# Patient Record
Sex: Male | Born: 1945
Health system: Southern US, Community
[De-identification: ages and names within clinical notes are randomized; demographics above are authoritative.]

## PROBLEM LIST (undated history)

## (undated) DIAGNOSIS — I10 Essential (primary) hypertension: Secondary | ICD-10-CM

## (undated) DIAGNOSIS — E785 Hyperlipidemia, unspecified: Secondary | ICD-10-CM

## (undated) HISTORY — DX: Hyperlipidemia, unspecified: E78.5

## (undated) HISTORY — PX: APPENDECTOMY: SHX54

## (undated) HISTORY — DX: Essential (primary) hypertension: I10

---

## 2004-06-09 ENCOUNTER — Ambulatory Visit (HOSPITAL_COMMUNITY): Admission: RE | Admit: 2004-06-09 | Discharge: 2004-06-09 | Payer: Self-pay | Admitting: Gastroenterology

## 2006-08-21 ENCOUNTER — Emergency Department (HOSPITAL_COMMUNITY): Admission: EM | Admit: 2006-08-21 | Discharge: 2006-08-22 | Payer: Self-pay | Admitting: Emergency Medicine

## 2011-10-10 DIAGNOSIS — Z1212 Encounter for screening for malignant neoplasm of rectum: Secondary | ICD-10-CM | POA: Diagnosis not present

## 2011-10-10 DIAGNOSIS — E785 Hyperlipidemia, unspecified: Secondary | ICD-10-CM | POA: Diagnosis not present

## 2011-10-10 DIAGNOSIS — E119 Type 2 diabetes mellitus without complications: Secondary | ICD-10-CM | POA: Diagnosis not present

## 2011-10-10 DIAGNOSIS — Z125 Encounter for screening for malignant neoplasm of prostate: Secondary | ICD-10-CM | POA: Diagnosis not present

## 2011-10-10 DIAGNOSIS — E559 Vitamin D deficiency, unspecified: Secondary | ICD-10-CM | POA: Diagnosis not present

## 2011-10-10 DIAGNOSIS — Z0000 Encounter for general adult medical examination without abnormal findings: Secondary | ICD-10-CM | POA: Diagnosis not present

## 2011-10-10 DIAGNOSIS — I10 Essential (primary) hypertension: Secondary | ICD-10-CM | POA: Diagnosis not present

## 2012-03-09 DIAGNOSIS — B029 Zoster without complications: Secondary | ICD-10-CM | POA: Diagnosis not present

## 2012-03-09 DIAGNOSIS — Z79899 Other long term (current) drug therapy: Secondary | ICD-10-CM | POA: Diagnosis not present

## 2012-04-12 DIAGNOSIS — M25519 Pain in unspecified shoulder: Secondary | ICD-10-CM | POA: Diagnosis not present

## 2012-04-12 DIAGNOSIS — I10 Essential (primary) hypertension: Secondary | ICD-10-CM | POA: Diagnosis not present

## 2012-04-12 DIAGNOSIS — Z79899 Other long term (current) drug therapy: Secondary | ICD-10-CM | POA: Diagnosis not present

## 2012-11-15 DIAGNOSIS — Z79899 Other long term (current) drug therapy: Secondary | ICD-10-CM | POA: Diagnosis not present

## 2012-11-15 DIAGNOSIS — Z125 Encounter for screening for malignant neoplasm of prostate: Secondary | ICD-10-CM | POA: Diagnosis not present

## 2012-11-15 DIAGNOSIS — R7309 Other abnormal glucose: Secondary | ICD-10-CM | POA: Diagnosis not present

## 2012-11-15 DIAGNOSIS — I10 Essential (primary) hypertension: Secondary | ICD-10-CM | POA: Diagnosis not present

## 2012-11-15 DIAGNOSIS — J309 Allergic rhinitis, unspecified: Secondary | ICD-10-CM | POA: Diagnosis not present

## 2012-11-15 DIAGNOSIS — Z23 Encounter for immunization: Secondary | ICD-10-CM | POA: Diagnosis not present

## 2012-11-15 DIAGNOSIS — N400 Enlarged prostate without lower urinary tract symptoms: Secondary | ICD-10-CM | POA: Diagnosis not present

## 2012-11-15 DIAGNOSIS — Z0000 Encounter for general adult medical examination without abnormal findings: Secondary | ICD-10-CM | POA: Diagnosis not present

## 2012-11-15 DIAGNOSIS — F17200 Nicotine dependence, unspecified, uncomplicated: Secondary | ICD-10-CM | POA: Diagnosis not present

## 2012-11-15 DIAGNOSIS — E559 Vitamin D deficiency, unspecified: Secondary | ICD-10-CM | POA: Diagnosis not present

## 2012-11-15 DIAGNOSIS — Z1212 Encounter for screening for malignant neoplasm of rectum: Secondary | ICD-10-CM | POA: Diagnosis not present

## 2014-01-06 DIAGNOSIS — R634 Abnormal weight loss: Secondary | ICD-10-CM | POA: Diagnosis not present

## 2014-01-06 DIAGNOSIS — R109 Unspecified abdominal pain: Secondary | ICD-10-CM | POA: Diagnosis not present

## 2014-01-06 DIAGNOSIS — K219 Gastro-esophageal reflux disease without esophagitis: Secondary | ICD-10-CM | POA: Diagnosis not present

## 2014-01-06 DIAGNOSIS — R413 Other amnesia: Secondary | ICD-10-CM | POA: Diagnosis not present

## 2014-01-06 DIAGNOSIS — R221 Localized swelling, mass and lump, neck: Secondary | ICD-10-CM | POA: Diagnosis not present

## 2014-01-06 DIAGNOSIS — Z79899 Other long term (current) drug therapy: Secondary | ICD-10-CM | POA: Diagnosis not present

## 2014-01-06 DIAGNOSIS — R220 Localized swelling, mass and lump, head: Secondary | ICD-10-CM | POA: Diagnosis not present

## 2014-01-07 ENCOUNTER — Ambulatory Visit
Admission: RE | Admit: 2014-01-07 | Discharge: 2014-01-07 | Disposition: A | Discharge status: Home / Self Care | Payer: Medicare Other | Source: Ambulatory Visit | Attending: Family | Admitting: Family

## 2014-01-07 ENCOUNTER — Other Ambulatory Visit: Payer: Self-pay | Admitting: Family

## 2014-01-07 DIAGNOSIS — R634 Abnormal weight loss: Secondary | ICD-10-CM | POA: Diagnosis not present

## 2014-01-07 DIAGNOSIS — R059 Cough, unspecified: Secondary | ICD-10-CM | POA: Diagnosis not present

## 2014-01-07 DIAGNOSIS — R05 Cough: Secondary | ICD-10-CM | POA: Diagnosis not present

## 2014-01-07 DIAGNOSIS — F17200 Nicotine dependence, unspecified, uncomplicated: Secondary | ICD-10-CM | POA: Diagnosis not present

## 2014-01-07 DIAGNOSIS — Z720 Tobacco use: Secondary | ICD-10-CM

## 2014-01-10 DIAGNOSIS — R413 Other amnesia: Secondary | ICD-10-CM | POA: Diagnosis not present

## 2014-01-10 DIAGNOSIS — R0602 Shortness of breath: Secondary | ICD-10-CM | POA: Diagnosis not present

## 2014-01-10 DIAGNOSIS — R630 Anorexia: Secondary | ICD-10-CM | POA: Diagnosis not present

## 2014-01-10 DIAGNOSIS — Z125 Encounter for screening for malignant neoplasm of prostate: Secondary | ICD-10-CM | POA: Diagnosis not present

## 2014-01-10 DIAGNOSIS — R634 Abnormal weight loss: Secondary | ICD-10-CM | POA: Diagnosis not present

## 2014-01-14 DIAGNOSIS — R634 Abnormal weight loss: Secondary | ICD-10-CM | POA: Diagnosis not present

## 2014-01-14 DIAGNOSIS — Z1211 Encounter for screening for malignant neoplasm of colon: Secondary | ICD-10-CM | POA: Diagnosis not present

## 2014-01-14 DIAGNOSIS — Z86010 Personal history of colonic polyps: Secondary | ICD-10-CM | POA: Diagnosis not present

## 2014-01-14 DIAGNOSIS — R198 Other specified symptoms and signs involving the digestive system and abdomen: Secondary | ICD-10-CM | POA: Diagnosis not present

## 2014-01-24 DIAGNOSIS — E7800 Pure hypercholesterolemia, unspecified: Secondary | ICD-10-CM | POA: Diagnosis not present

## 2014-01-24 DIAGNOSIS — K7689 Other specified diseases of liver: Secondary | ICD-10-CM | POA: Diagnosis not present

## 2014-01-29 DIAGNOSIS — K7689 Other specified diseases of liver: Secondary | ICD-10-CM | POA: Diagnosis not present

## 2014-02-06 DIAGNOSIS — R413 Other amnesia: Secondary | ICD-10-CM | POA: Diagnosis not present

## 2014-02-06 DIAGNOSIS — G3184 Mild cognitive impairment, so stated: Secondary | ICD-10-CM | POA: Diagnosis not present

## 2014-03-10 DIAGNOSIS — G3184 Mild cognitive impairment, so stated: Secondary | ICD-10-CM | POA: Diagnosis not present

## 2014-04-04 DIAGNOSIS — E780 Pure hypercholesterolemia: Secondary | ICD-10-CM | POA: Diagnosis not present

## 2014-04-04 DIAGNOSIS — K7689 Other specified diseases of liver: Secondary | ICD-10-CM | POA: Diagnosis not present

## 2014-04-04 DIAGNOSIS — R634 Abnormal weight loss: Secondary | ICD-10-CM | POA: Diagnosis not present

## 2014-04-04 DIAGNOSIS — G3184 Mild cognitive impairment, so stated: Secondary | ICD-10-CM | POA: Diagnosis not present

## 2014-04-11 DIAGNOSIS — R412 Retrograde amnesia: Secondary | ICD-10-CM | POA: Diagnosis not present

## 2014-04-11 DIAGNOSIS — R413 Other amnesia: Secondary | ICD-10-CM | POA: Diagnosis not present

## 2014-04-11 DIAGNOSIS — G319 Degenerative disease of nervous system, unspecified: Secondary | ICD-10-CM | POA: Diagnosis not present

## 2014-04-11 DIAGNOSIS — G3184 Mild cognitive impairment, so stated: Secondary | ICD-10-CM | POA: Diagnosis not present

## 2014-06-10 DIAGNOSIS — G3184 Mild cognitive impairment, so stated: Secondary | ICD-10-CM | POA: Diagnosis not present

## 2014-06-10 DIAGNOSIS — E780 Pure hypercholesterolemia: Secondary | ICD-10-CM | POA: Diagnosis not present

## 2014-06-10 DIAGNOSIS — E559 Vitamin D deficiency, unspecified: Secondary | ICD-10-CM | POA: Diagnosis not present

## 2014-06-10 DIAGNOSIS — I10 Essential (primary) hypertension: Secondary | ICD-10-CM | POA: Diagnosis not present

## 2014-06-10 DIAGNOSIS — K7689 Other specified diseases of liver: Secondary | ICD-10-CM | POA: Diagnosis not present

## 2014-06-23 DIAGNOSIS — R3911 Hesitancy of micturition: Secondary | ICD-10-CM | POA: Diagnosis not present

## 2014-06-23 DIAGNOSIS — I10 Essential (primary) hypertension: Secondary | ICD-10-CM | POA: Diagnosis not present

## 2014-06-23 DIAGNOSIS — R7309 Other abnormal glucose: Secondary | ICD-10-CM | POA: Diagnosis not present

## 2014-06-23 DIAGNOSIS — R627 Adult failure to thrive: Secondary | ICD-10-CM | POA: Diagnosis not present

## 2014-06-23 DIAGNOSIS — R5383 Other fatigue: Secondary | ICD-10-CM | POA: Diagnosis not present

## 2014-06-23 DIAGNOSIS — E782 Mixed hyperlipidemia: Secondary | ICD-10-CM | POA: Diagnosis not present

## 2014-08-08 DIAGNOSIS — R0602 Shortness of breath: Secondary | ICD-10-CM | POA: Diagnosis not present

## 2014-08-08 DIAGNOSIS — E559 Vitamin D deficiency, unspecified: Secondary | ICD-10-CM | POA: Diagnosis not present

## 2014-08-08 DIAGNOSIS — E870 Hyperosmolality and hypernatremia: Secondary | ICD-10-CM | POA: Diagnosis not present

## 2014-08-08 DIAGNOSIS — I10 Essential (primary) hypertension: Secondary | ICD-10-CM | POA: Diagnosis not present

## 2014-08-08 DIAGNOSIS — F17200 Nicotine dependence, unspecified, uncomplicated: Secondary | ICD-10-CM | POA: Diagnosis not present

## 2014-08-08 DIAGNOSIS — E780 Pure hypercholesterolemia: Secondary | ICD-10-CM | POA: Diagnosis not present

## 2014-08-18 DIAGNOSIS — E780 Pure hypercholesterolemia: Secondary | ICD-10-CM | POA: Diagnosis not present

## 2014-08-18 DIAGNOSIS — Z1389 Encounter for screening for other disorder: Secondary | ICD-10-CM | POA: Diagnosis not present

## 2014-08-18 DIAGNOSIS — I10 Essential (primary) hypertension: Secondary | ICD-10-CM | POA: Diagnosis not present

## 2014-08-18 DIAGNOSIS — E559 Vitamin D deficiency, unspecified: Secondary | ICD-10-CM | POA: Diagnosis not present

## 2014-09-08 DIAGNOSIS — G3184 Mild cognitive impairment, so stated: Secondary | ICD-10-CM | POA: Diagnosis not present

## 2014-09-10 DIAGNOSIS — R3911 Hesitancy of micturition: Secondary | ICD-10-CM | POA: Diagnosis not present

## 2014-09-10 DIAGNOSIS — N182 Chronic kidney disease, stage 2 (mild): Secondary | ICD-10-CM | POA: Diagnosis not present

## 2014-09-10 DIAGNOSIS — R7309 Other abnormal glucose: Secondary | ICD-10-CM | POA: Diagnosis not present

## 2014-09-10 DIAGNOSIS — Z0000 Encounter for general adult medical examination without abnormal findings: Secondary | ICD-10-CM | POA: Diagnosis not present

## 2014-09-11 DIAGNOSIS — Z0000 Encounter for general adult medical examination without abnormal findings: Secondary | ICD-10-CM | POA: Diagnosis not present

## 2014-09-25 DIAGNOSIS — Z720 Tobacco use: Secondary | ICD-10-CM | POA: Diagnosis not present

## 2014-09-25 DIAGNOSIS — E782 Mixed hyperlipidemia: Secondary | ICD-10-CM | POA: Diagnosis not present

## 2014-09-25 DIAGNOSIS — I10 Essential (primary) hypertension: Secondary | ICD-10-CM | POA: Diagnosis not present

## 2014-09-25 DIAGNOSIS — R413 Other amnesia: Secondary | ICD-10-CM | POA: Diagnosis not present

## 2014-11-07 DIAGNOSIS — R634 Abnormal weight loss: Secondary | ICD-10-CM | POA: Diagnosis not present

## 2014-11-07 DIAGNOSIS — E782 Mixed hyperlipidemia: Secondary | ICD-10-CM | POA: Diagnosis not present

## 2014-11-07 DIAGNOSIS — E559 Vitamin D deficiency, unspecified: Secondary | ICD-10-CM | POA: Diagnosis not present

## 2014-11-07 DIAGNOSIS — Z79899 Other long term (current) drug therapy: Secondary | ICD-10-CM | POA: Diagnosis not present

## 2015-02-04 DIAGNOSIS — R634 Abnormal weight loss: Secondary | ICD-10-CM | POA: Diagnosis not present

## 2015-02-04 DIAGNOSIS — R413 Other amnesia: Secondary | ICD-10-CM | POA: Diagnosis not present

## 2015-02-04 DIAGNOSIS — Z23 Encounter for immunization: Secondary | ICD-10-CM | POA: Diagnosis not present

## 2015-02-04 DIAGNOSIS — I10 Essential (primary) hypertension: Secondary | ICD-10-CM | POA: Diagnosis not present

## 2015-03-09 DIAGNOSIS — G3184 Mild cognitive impairment, so stated: Secondary | ICD-10-CM | POA: Diagnosis not present

## 2015-03-20 DIAGNOSIS — R413 Other amnesia: Secondary | ICD-10-CM | POA: Diagnosis not present

## 2015-03-20 DIAGNOSIS — R627 Adult failure to thrive: Secondary | ICD-10-CM | POA: Diagnosis not present

## 2015-08-20 DIAGNOSIS — F0390 Unspecified dementia without behavioral disturbance: Secondary | ICD-10-CM | POA: Diagnosis not present

## 2015-08-20 DIAGNOSIS — E782 Mixed hyperlipidemia: Secondary | ICD-10-CM | POA: Diagnosis not present

## 2015-08-20 DIAGNOSIS — I10 Essential (primary) hypertension: Secondary | ICD-10-CM | POA: Diagnosis not present

## 2015-09-25 DIAGNOSIS — R413 Other amnesia: Secondary | ICD-10-CM | POA: Diagnosis not present

## 2016-02-23 DIAGNOSIS — F0280 Dementia in other diseases classified elsewhere without behavioral disturbance: Secondary | ICD-10-CM | POA: Diagnosis not present

## 2016-02-23 DIAGNOSIS — I10 Essential (primary) hypertension: Secondary | ICD-10-CM | POA: Diagnosis not present

## 2016-02-23 DIAGNOSIS — E119 Type 2 diabetes mellitus without complications: Secondary | ICD-10-CM | POA: Diagnosis not present

## 2016-02-23 DIAGNOSIS — E785 Hyperlipidemia, unspecified: Secondary | ICD-10-CM | POA: Diagnosis not present

## 2016-02-23 DIAGNOSIS — G300 Alzheimer's disease with early onset: Secondary | ICD-10-CM | POA: Diagnosis not present

## 2016-02-23 DIAGNOSIS — E559 Vitamin D deficiency, unspecified: Secondary | ICD-10-CM | POA: Diagnosis not present

## 2016-02-24 DIAGNOSIS — E119 Type 2 diabetes mellitus without complications: Secondary | ICD-10-CM | POA: Diagnosis not present

## 2016-02-24 DIAGNOSIS — E785 Hyperlipidemia, unspecified: Secondary | ICD-10-CM | POA: Diagnosis not present

## 2016-02-24 DIAGNOSIS — F0280 Dementia in other diseases classified elsewhere without behavioral disturbance: Secondary | ICD-10-CM | POA: Diagnosis not present

## 2016-02-24 DIAGNOSIS — G300 Alzheimer's disease with early onset: Secondary | ICD-10-CM | POA: Diagnosis not present

## 2016-02-24 DIAGNOSIS — I10 Essential (primary) hypertension: Secondary | ICD-10-CM | POA: Diagnosis not present

## 2016-02-24 DIAGNOSIS — E559 Vitamin D deficiency, unspecified: Secondary | ICD-10-CM | POA: Diagnosis not present

## 2016-02-25 DIAGNOSIS — E559 Vitamin D deficiency, unspecified: Secondary | ICD-10-CM | POA: Diagnosis not present

## 2016-02-25 DIAGNOSIS — G300 Alzheimer's disease with early onset: Secondary | ICD-10-CM | POA: Diagnosis not present

## 2016-02-25 DIAGNOSIS — F0280 Dementia in other diseases classified elsewhere without behavioral disturbance: Secondary | ICD-10-CM | POA: Diagnosis not present

## 2016-02-25 DIAGNOSIS — E119 Type 2 diabetes mellitus without complications: Secondary | ICD-10-CM | POA: Diagnosis not present

## 2016-02-25 DIAGNOSIS — I10 Essential (primary) hypertension: Secondary | ICD-10-CM | POA: Diagnosis not present

## 2016-02-25 DIAGNOSIS — E785 Hyperlipidemia, unspecified: Secondary | ICD-10-CM | POA: Diagnosis not present

## 2016-03-03 DIAGNOSIS — R413 Other amnesia: Secondary | ICD-10-CM | POA: Diagnosis not present

## 2016-03-03 DIAGNOSIS — I10 Essential (primary) hypertension: Secondary | ICD-10-CM | POA: Diagnosis not present

## 2016-03-03 DIAGNOSIS — R7309 Other abnormal glucose: Secondary | ICD-10-CM | POA: Diagnosis not present

## 2016-03-03 DIAGNOSIS — Z0000 Encounter for general adult medical examination without abnormal findings: Secondary | ICD-10-CM | POA: Diagnosis not present

## 2016-03-03 DIAGNOSIS — Z23 Encounter for immunization: Secondary | ICD-10-CM | POA: Diagnosis not present

## 2016-03-03 DIAGNOSIS — E782 Mixed hyperlipidemia: Secondary | ICD-10-CM | POA: Diagnosis not present

## 2016-03-09 DIAGNOSIS — I10 Essential (primary) hypertension: Secondary | ICD-10-CM | POA: Diagnosis not present

## 2016-03-09 DIAGNOSIS — F0280 Dementia in other diseases classified elsewhere without behavioral disturbance: Secondary | ICD-10-CM | POA: Diagnosis not present

## 2016-03-09 DIAGNOSIS — E559 Vitamin D deficiency, unspecified: Secondary | ICD-10-CM | POA: Diagnosis not present

## 2016-03-09 DIAGNOSIS — G300 Alzheimer's disease with early onset: Secondary | ICD-10-CM | POA: Diagnosis not present

## 2016-03-09 DIAGNOSIS — E785 Hyperlipidemia, unspecified: Secondary | ICD-10-CM | POA: Diagnosis not present

## 2016-03-09 DIAGNOSIS — E119 Type 2 diabetes mellitus without complications: Secondary | ICD-10-CM | POA: Diagnosis not present

## 2016-03-11 DIAGNOSIS — E559 Vitamin D deficiency, unspecified: Secondary | ICD-10-CM | POA: Diagnosis not present

## 2016-03-11 DIAGNOSIS — F0280 Dementia in other diseases classified elsewhere without behavioral disturbance: Secondary | ICD-10-CM | POA: Diagnosis not present

## 2016-03-11 DIAGNOSIS — I10 Essential (primary) hypertension: Secondary | ICD-10-CM | POA: Diagnosis not present

## 2016-03-11 DIAGNOSIS — E119 Type 2 diabetes mellitus without complications: Secondary | ICD-10-CM | POA: Diagnosis not present

## 2016-03-11 DIAGNOSIS — E785 Hyperlipidemia, unspecified: Secondary | ICD-10-CM | POA: Diagnosis not present

## 2016-03-11 DIAGNOSIS — G300 Alzheimer's disease with early onset: Secondary | ICD-10-CM | POA: Diagnosis not present

## 2016-04-22 DIAGNOSIS — E559 Vitamin D deficiency, unspecified: Secondary | ICD-10-CM | POA: Diagnosis not present

## 2016-04-22 DIAGNOSIS — F0280 Dementia in other diseases classified elsewhere without behavioral disturbance: Secondary | ICD-10-CM | POA: Diagnosis not present

## 2016-04-22 DIAGNOSIS — G300 Alzheimer's disease with early onset: Secondary | ICD-10-CM | POA: Diagnosis not present

## 2016-04-22 DIAGNOSIS — E119 Type 2 diabetes mellitus without complications: Secondary | ICD-10-CM | POA: Diagnosis not present

## 2016-04-22 DIAGNOSIS — I10 Essential (primary) hypertension: Secondary | ICD-10-CM | POA: Diagnosis not present

## 2016-04-22 DIAGNOSIS — E785 Hyperlipidemia, unspecified: Secondary | ICD-10-CM | POA: Diagnosis not present

## 2016-04-23 DIAGNOSIS — E559 Vitamin D deficiency, unspecified: Secondary | ICD-10-CM | POA: Diagnosis not present

## 2016-04-23 DIAGNOSIS — E119 Type 2 diabetes mellitus without complications: Secondary | ICD-10-CM | POA: Diagnosis not present

## 2016-04-23 DIAGNOSIS — F0280 Dementia in other diseases classified elsewhere without behavioral disturbance: Secondary | ICD-10-CM | POA: Diagnosis not present

## 2016-04-23 DIAGNOSIS — Z7982 Long term (current) use of aspirin: Secondary | ICD-10-CM | POA: Diagnosis not present

## 2016-04-23 DIAGNOSIS — E782 Mixed hyperlipidemia: Secondary | ICD-10-CM | POA: Diagnosis not present

## 2016-04-23 DIAGNOSIS — G300 Alzheimer's disease with early onset: Secondary | ICD-10-CM | POA: Diagnosis not present

## 2016-04-23 DIAGNOSIS — I10 Essential (primary) hypertension: Secondary | ICD-10-CM | POA: Diagnosis not present

## 2016-05-05 DIAGNOSIS — E119 Type 2 diabetes mellitus without complications: Secondary | ICD-10-CM | POA: Diagnosis not present

## 2016-05-05 DIAGNOSIS — G300 Alzheimer's disease with early onset: Secondary | ICD-10-CM | POA: Diagnosis not present

## 2016-05-05 DIAGNOSIS — E782 Mixed hyperlipidemia: Secondary | ICD-10-CM | POA: Diagnosis not present

## 2016-05-05 DIAGNOSIS — F0280 Dementia in other diseases classified elsewhere without behavioral disturbance: Secondary | ICD-10-CM | POA: Diagnosis not present

## 2016-05-05 DIAGNOSIS — E559 Vitamin D deficiency, unspecified: Secondary | ICD-10-CM | POA: Diagnosis not present

## 2016-05-05 DIAGNOSIS — I10 Essential (primary) hypertension: Secondary | ICD-10-CM | POA: Diagnosis not present

## 2016-05-10 DIAGNOSIS — E119 Type 2 diabetes mellitus without complications: Secondary | ICD-10-CM | POA: Diagnosis not present

## 2016-05-10 DIAGNOSIS — E559 Vitamin D deficiency, unspecified: Secondary | ICD-10-CM | POA: Diagnosis not present

## 2016-05-10 DIAGNOSIS — G300 Alzheimer's disease with early onset: Secondary | ICD-10-CM | POA: Diagnosis not present

## 2016-05-10 DIAGNOSIS — E782 Mixed hyperlipidemia: Secondary | ICD-10-CM | POA: Diagnosis not present

## 2016-05-10 DIAGNOSIS — I10 Essential (primary) hypertension: Secondary | ICD-10-CM | POA: Diagnosis not present

## 2016-05-10 DIAGNOSIS — F0280 Dementia in other diseases classified elsewhere without behavioral disturbance: Secondary | ICD-10-CM | POA: Diagnosis not present

## 2016-05-13 DIAGNOSIS — E119 Type 2 diabetes mellitus without complications: Secondary | ICD-10-CM | POA: Diagnosis not present

## 2016-05-13 DIAGNOSIS — E559 Vitamin D deficiency, unspecified: Secondary | ICD-10-CM | POA: Diagnosis not present

## 2016-05-13 DIAGNOSIS — I10 Essential (primary) hypertension: Secondary | ICD-10-CM | POA: Diagnosis not present

## 2016-05-13 DIAGNOSIS — F0280 Dementia in other diseases classified elsewhere without behavioral disturbance: Secondary | ICD-10-CM | POA: Diagnosis not present

## 2016-05-13 DIAGNOSIS — E782 Mixed hyperlipidemia: Secondary | ICD-10-CM | POA: Diagnosis not present

## 2016-05-13 DIAGNOSIS — G300 Alzheimer's disease with early onset: Secondary | ICD-10-CM | POA: Diagnosis not present

## 2016-05-25 DIAGNOSIS — G300 Alzheimer's disease with early onset: Secondary | ICD-10-CM | POA: Diagnosis not present

## 2016-05-25 DIAGNOSIS — E559 Vitamin D deficiency, unspecified: Secondary | ICD-10-CM | POA: Diagnosis not present

## 2016-05-25 DIAGNOSIS — I10 Essential (primary) hypertension: Secondary | ICD-10-CM | POA: Diagnosis not present

## 2016-05-25 DIAGNOSIS — E119 Type 2 diabetes mellitus without complications: Secondary | ICD-10-CM | POA: Diagnosis not present

## 2016-05-25 DIAGNOSIS — F0280 Dementia in other diseases classified elsewhere without behavioral disturbance: Secondary | ICD-10-CM | POA: Diagnosis not present

## 2016-05-25 DIAGNOSIS — E782 Mixed hyperlipidemia: Secondary | ICD-10-CM | POA: Diagnosis not present

## 2016-06-09 DIAGNOSIS — E782 Mixed hyperlipidemia: Secondary | ICD-10-CM | POA: Diagnosis not present

## 2016-06-09 DIAGNOSIS — G300 Alzheimer's disease with early onset: Secondary | ICD-10-CM | POA: Diagnosis not present

## 2016-06-09 DIAGNOSIS — E559 Vitamin D deficiency, unspecified: Secondary | ICD-10-CM | POA: Diagnosis not present

## 2016-06-09 DIAGNOSIS — E119 Type 2 diabetes mellitus without complications: Secondary | ICD-10-CM | POA: Diagnosis not present

## 2016-06-09 DIAGNOSIS — I10 Essential (primary) hypertension: Secondary | ICD-10-CM | POA: Diagnosis not present

## 2016-06-09 DIAGNOSIS — F0280 Dementia in other diseases classified elsewhere without behavioral disturbance: Secondary | ICD-10-CM | POA: Diagnosis not present

## 2016-06-14 DIAGNOSIS — E559 Vitamin D deficiency, unspecified: Secondary | ICD-10-CM | POA: Diagnosis not present

## 2016-06-14 DIAGNOSIS — I10 Essential (primary) hypertension: Secondary | ICD-10-CM | POA: Diagnosis not present

## 2016-06-14 DIAGNOSIS — E782 Mixed hyperlipidemia: Secondary | ICD-10-CM | POA: Diagnosis not present

## 2016-06-14 DIAGNOSIS — E119 Type 2 diabetes mellitus without complications: Secondary | ICD-10-CM | POA: Diagnosis not present

## 2016-06-14 DIAGNOSIS — G300 Alzheimer's disease with early onset: Secondary | ICD-10-CM | POA: Diagnosis not present

## 2016-06-14 DIAGNOSIS — F0280 Dementia in other diseases classified elsewhere without behavioral disturbance: Secondary | ICD-10-CM | POA: Diagnosis not present

## 2016-06-20 DIAGNOSIS — G300 Alzheimer's disease with early onset: Secondary | ICD-10-CM | POA: Diagnosis not present

## 2016-06-20 DIAGNOSIS — F0280 Dementia in other diseases classified elsewhere without behavioral disturbance: Secondary | ICD-10-CM | POA: Diagnosis not present

## 2016-06-20 DIAGNOSIS — E559 Vitamin D deficiency, unspecified: Secondary | ICD-10-CM | POA: Diagnosis not present

## 2016-06-20 DIAGNOSIS — I10 Essential (primary) hypertension: Secondary | ICD-10-CM | POA: Diagnosis not present

## 2016-06-20 DIAGNOSIS — E119 Type 2 diabetes mellitus without complications: Secondary | ICD-10-CM | POA: Diagnosis not present

## 2016-06-20 DIAGNOSIS — E782 Mixed hyperlipidemia: Secondary | ICD-10-CM | POA: Diagnosis not present

## 2016-06-22 DIAGNOSIS — F0280 Dementia in other diseases classified elsewhere without behavioral disturbance: Secondary | ICD-10-CM | POA: Diagnosis not present

## 2016-06-22 DIAGNOSIS — E782 Mixed hyperlipidemia: Secondary | ICD-10-CM | POA: Diagnosis not present

## 2016-06-22 DIAGNOSIS — E559 Vitamin D deficiency, unspecified: Secondary | ICD-10-CM | POA: Diagnosis not present

## 2016-06-22 DIAGNOSIS — Z7982 Long term (current) use of aspirin: Secondary | ICD-10-CM | POA: Diagnosis not present

## 2016-06-22 DIAGNOSIS — E119 Type 2 diabetes mellitus without complications: Secondary | ICD-10-CM | POA: Diagnosis not present

## 2016-06-22 DIAGNOSIS — I10 Essential (primary) hypertension: Secondary | ICD-10-CM | POA: Diagnosis not present

## 2016-06-22 DIAGNOSIS — G300 Alzheimer's disease with early onset: Secondary | ICD-10-CM | POA: Diagnosis not present

## 2016-07-07 DIAGNOSIS — E119 Type 2 diabetes mellitus without complications: Secondary | ICD-10-CM | POA: Diagnosis not present

## 2016-07-07 DIAGNOSIS — E559 Vitamin D deficiency, unspecified: Secondary | ICD-10-CM | POA: Diagnosis not present

## 2016-07-07 DIAGNOSIS — E782 Mixed hyperlipidemia: Secondary | ICD-10-CM | POA: Diagnosis not present

## 2016-07-07 DIAGNOSIS — G300 Alzheimer's disease with early onset: Secondary | ICD-10-CM | POA: Diagnosis not present

## 2016-07-07 DIAGNOSIS — F0280 Dementia in other diseases classified elsewhere without behavioral disturbance: Secondary | ICD-10-CM | POA: Diagnosis not present

## 2016-07-07 DIAGNOSIS — I10 Essential (primary) hypertension: Secondary | ICD-10-CM | POA: Diagnosis not present

## 2016-07-15 DIAGNOSIS — E782 Mixed hyperlipidemia: Secondary | ICD-10-CM | POA: Diagnosis not present

## 2016-07-15 DIAGNOSIS — I10 Essential (primary) hypertension: Secondary | ICD-10-CM | POA: Diagnosis not present

## 2016-07-15 DIAGNOSIS — F0280 Dementia in other diseases classified elsewhere without behavioral disturbance: Secondary | ICD-10-CM | POA: Diagnosis not present

## 2016-07-15 DIAGNOSIS — G300 Alzheimer's disease with early onset: Secondary | ICD-10-CM | POA: Diagnosis not present

## 2016-07-15 DIAGNOSIS — E559 Vitamin D deficiency, unspecified: Secondary | ICD-10-CM | POA: Diagnosis not present

## 2016-07-15 DIAGNOSIS — E119 Type 2 diabetes mellitus without complications: Secondary | ICD-10-CM | POA: Diagnosis not present

## 2016-07-18 DIAGNOSIS — E559 Vitamin D deficiency, unspecified: Secondary | ICD-10-CM | POA: Diagnosis not present

## 2016-07-18 DIAGNOSIS — I10 Essential (primary) hypertension: Secondary | ICD-10-CM | POA: Diagnosis not present

## 2016-07-18 DIAGNOSIS — G300 Alzheimer's disease with early onset: Secondary | ICD-10-CM | POA: Diagnosis not present

## 2016-07-18 DIAGNOSIS — F0280 Dementia in other diseases classified elsewhere without behavioral disturbance: Secondary | ICD-10-CM | POA: Diagnosis not present

## 2016-07-18 DIAGNOSIS — E119 Type 2 diabetes mellitus without complications: Secondary | ICD-10-CM | POA: Diagnosis not present

## 2016-07-18 DIAGNOSIS — E782 Mixed hyperlipidemia: Secondary | ICD-10-CM | POA: Diagnosis not present

## 2016-07-28 DIAGNOSIS — G300 Alzheimer's disease with early onset: Secondary | ICD-10-CM | POA: Diagnosis not present

## 2016-07-28 DIAGNOSIS — F0280 Dementia in other diseases classified elsewhere without behavioral disturbance: Secondary | ICD-10-CM | POA: Diagnosis not present

## 2016-07-28 DIAGNOSIS — I10 Essential (primary) hypertension: Secondary | ICD-10-CM | POA: Diagnosis not present

## 2016-07-28 DIAGNOSIS — E782 Mixed hyperlipidemia: Secondary | ICD-10-CM | POA: Diagnosis not present

## 2016-07-28 DIAGNOSIS — E559 Vitamin D deficiency, unspecified: Secondary | ICD-10-CM | POA: Diagnosis not present

## 2016-07-28 DIAGNOSIS — E119 Type 2 diabetes mellitus without complications: Secondary | ICD-10-CM | POA: Diagnosis not present

## 2016-08-11 DIAGNOSIS — E782 Mixed hyperlipidemia: Secondary | ICD-10-CM | POA: Diagnosis not present

## 2016-08-11 DIAGNOSIS — E119 Type 2 diabetes mellitus without complications: Secondary | ICD-10-CM | POA: Diagnosis not present

## 2016-08-11 DIAGNOSIS — I10 Essential (primary) hypertension: Secondary | ICD-10-CM | POA: Diagnosis not present

## 2016-08-11 DIAGNOSIS — G300 Alzheimer's disease with early onset: Secondary | ICD-10-CM | POA: Diagnosis not present

## 2016-08-11 DIAGNOSIS — E559 Vitamin D deficiency, unspecified: Secondary | ICD-10-CM | POA: Diagnosis not present

## 2016-08-11 DIAGNOSIS — F0280 Dementia in other diseases classified elsewhere without behavioral disturbance: Secondary | ICD-10-CM | POA: Diagnosis not present

## 2017-01-11 DIAGNOSIS — Z9114 Patient's other noncompliance with medication regimen: Secondary | ICD-10-CM | POA: Diagnosis not present

## 2017-01-11 DIAGNOSIS — I10 Essential (primary) hypertension: Secondary | ICD-10-CM | POA: Diagnosis not present

## 2017-01-11 DIAGNOSIS — F0390 Unspecified dementia without behavioral disturbance: Secondary | ICD-10-CM | POA: Diagnosis not present

## 2017-01-11 DIAGNOSIS — E782 Mixed hyperlipidemia: Secondary | ICD-10-CM | POA: Diagnosis not present

## 2017-05-05 DIAGNOSIS — Z720 Tobacco use: Secondary | ICD-10-CM | POA: Diagnosis not present

## 2017-05-05 DIAGNOSIS — I10 Essential (primary) hypertension: Secondary | ICD-10-CM | POA: Diagnosis not present

## 2017-09-04 DIAGNOSIS — I10 Essential (primary) hypertension: Secondary | ICD-10-CM | POA: Diagnosis not present

## 2017-09-04 DIAGNOSIS — R7309 Other abnormal glucose: Secondary | ICD-10-CM | POA: Diagnosis not present

## 2017-09-04 DIAGNOSIS — R413 Other amnesia: Secondary | ICD-10-CM | POA: Diagnosis not present

## 2017-09-04 DIAGNOSIS — Z86010 Personal history of colonic polyps: Secondary | ICD-10-CM | POA: Diagnosis not present

## 2017-10-10 DIAGNOSIS — Z86010 Personal history of colonic polyps: Secondary | ICD-10-CM | POA: Diagnosis not present

## 2017-10-10 DIAGNOSIS — K5730 Diverticulosis of large intestine without perforation or abscess without bleeding: Secondary | ICD-10-CM | POA: Diagnosis not present

## 2017-10-10 DIAGNOSIS — Z1211 Encounter for screening for malignant neoplasm of colon: Secondary | ICD-10-CM | POA: Diagnosis not present

## 2018-02-14 DIAGNOSIS — Z86010 Personal history of colonic polyps: Secondary | ICD-10-CM | POA: Diagnosis not present

## 2018-02-14 DIAGNOSIS — D124 Benign neoplasm of descending colon: Secondary | ICD-10-CM | POA: Diagnosis not present

## 2018-02-14 DIAGNOSIS — Z1211 Encounter for screening for malignant neoplasm of colon: Secondary | ICD-10-CM | POA: Diagnosis not present

## 2018-02-14 DIAGNOSIS — K635 Polyp of colon: Secondary | ICD-10-CM | POA: Diagnosis not present

## 2018-02-14 DIAGNOSIS — D122 Benign neoplasm of ascending colon: Secondary | ICD-10-CM | POA: Diagnosis not present

## 2018-04-13 ENCOUNTER — Other Ambulatory Visit: Payer: Self-pay

## 2018-05-10 ENCOUNTER — Ambulatory Visit: Payer: Self-pay

## 2018-05-11 ENCOUNTER — Ambulatory Visit: Payer: Self-pay

## 2018-06-07 ENCOUNTER — Ambulatory Visit: Payer: Self-pay | Admitting: Nurse Practitioner

## 2018-06-07 ENCOUNTER — Ambulatory Visit: Payer: Self-pay

## 2018-06-11 ENCOUNTER — Telehealth: Payer: Self-pay | Admitting: Nurse Practitioner

## 2018-06-11 NOTE — Telephone Encounter (Signed)
I called both the home and mobile numbers to reschedule AWV/OV that was missed last week 06/07/2018.  There was no answer and no option to leave a message on either number. VDM (DD)

## 2018-06-20 ENCOUNTER — Telehealth: Payer: Self-pay

## 2018-06-20 NOTE — Telephone Encounter (Signed)
Called to schedule Medicare Annual Wellness Visit with the Nurse Health Advisor at [facility]. No Answer at Home number or Mobile number.  No answer machine on both phones; couldn't leave a message.  If patient returns call, please note: their last AWV was on 05/05/2017, please schedule AWV with NHA any date AFTER 05/06/2018, with [facility]  Thank you! For any questions please contact: Janace Hoard at [phone] or Skype [email or web address]

## 2018-06-26 NOTE — Telephone Encounter (Signed)
Called to schedule Medicare Annual Wellness Visit with the Nurse Health Advisor. At home phone number, message states call can not be completed as dialed,  At mobile phone number, it just rings with no message.   If patient returns call, please note: their last AWV was on 05/05/17, please schedule AWV with NHA any date AFTER 05/05/2018.  Need Office Visit also with provider at [facility].  Thank you! For any questions please contact: Janace Hoard at [phone] or Skype [email or web address]

## 2018-06-27 NOTE — Telephone Encounter (Signed)
Called to schedule Medicare Annual Wellness Visit with the Nurse Health Advisor. No answer at home and mobile numbers.    If patient returns call, please note: their last AWV was on 05/05/17, please schedule AWV with NHA any date AFTER 05/05/2018.  Thank you! For any questions please contact: Janace Hoard at [phone] or Skype [email or web address]

## 2018-06-28 ENCOUNTER — Other Ambulatory Visit: Payer: Self-pay

## 2018-06-28 MED ORDER — ESCITALOPRAM OXALATE 5 MG PO TABS: 5.0000 mg | ORAL_TABLET | Freq: Every day | ORAL | 0 refills | Status: DC

## 2018-07-04 NOTE — Telephone Encounter (Signed)
Spoke with patient and scheduled his Medicare Annual Wellness Visit (AWV-s), for 07/18/2018 at 2:00 PM.  Janace Hoard, Care Guide.

## 2018-07-09 ENCOUNTER — Ambulatory Visit (INDEPENDENT_AMBULATORY_CARE_PROVIDER_SITE_OTHER): Payer: Medicare Other | Admitting: Nurse Practitioner

## 2018-07-09 DIAGNOSIS — E559 Vitamin D deficiency, unspecified: Secondary | ICD-10-CM

## 2018-07-09 DIAGNOSIS — E782 Mixed hyperlipidemia: Secondary | ICD-10-CM

## 2018-07-09 DIAGNOSIS — R7303 Prediabetes: Secondary | ICD-10-CM

## 2018-07-09 DIAGNOSIS — I10 Essential (primary) hypertension: Secondary | ICD-10-CM

## 2018-07-09 DIAGNOSIS — F0391 Unspecified dementia with behavioral disturbance: Secondary | ICD-10-CM | POA: Diagnosis not present

## 2018-07-09 MED ORDER — AMLODIPINE BESYLATE 5 MG PO TABS: 5.0000 mg | ORAL_TABLET | Freq: Every day | ORAL | 1 refills | Status: DC

## 2018-07-09 NOTE — Progress Notes (Signed)
Subjective:     Patient ID: [name] , male    DOB: 09-Mar-1946 , 73 y.o.   MRN: [id number]   Chief Complaint  Patient presents with  . Hypertension    HPI  Memory loss - she feels his memory is worse at times.  He is not wanting to do anything and will sit in the room.  At times will not speak to her for 5 days.    Hypertension  This is a chronic problem. The current episode started more than 1 year ago. The problem has been gradually worsening since onset. The problem is uncontrolled. Pertinent negatives include no anxiety, chest pain, headaches or palpitations. There are no associated agents to hypertension. Risk factors for coronary artery disease include sedentary lifestyle. Past treatments include calcium channel blockers. There are no compliance problems.  There is no history of angina. There is no history of chronic renal disease.     No past medical history on file.   No family history on file.   Current Outpatient Medications:  .  amLODipine (NORVASC) 5 MG tablet, Take 5 mg by mouth daily. Patient currently not taking it due to not having anymore, Disp: , Rfl:  .  escitalopram (LEXAPRO) 5 MG tablet, Take 1 tablet (5 mg total) by mouth daily., Disp: 30 tablet, Rfl: 0 .  Memantine HCl-Donepezil HCl (NAMZARIC) 28-10 MG CP24, Take 1 capsule by mouth daily., Disp: , Rfl:  .  pravastatin (PRAVACHOL) 20 MG tablet, Take 20 mg by mouth daily., Disp: , Rfl:    No Known Allergies   Review of Systems  Constitutional: Negative.  Negative for fatigue.  HENT: Negative for sore throat.   Eyes: Negative for photophobia.  Respiratory: Negative.   Cardiovascular: Negative.  Negative for chest pain, palpitations and leg swelling.  Endocrine: Negative for polydipsia, polyphagia and polyuria.  Musculoskeletal: Negative.   Skin: Negative.   Neurological: Negative for dizziness and headaches.      There were no vitals filed for this visit. There is no height or weight on file to  calculate BMI.   Objective:  Physical Exam Vitals signs reviewed.  Constitutional:      Appearance: Normal appearance.  Cardiovascular:     Rate and Rhythm: Normal rate and regular rhythm.  Skin:    General: Skin is warm and dry.  Neurological:     Mental Status: He is alert.         Assessment And Plan:     1. Essential hypertension . B/P is controlled.  . CMP ordered to check renal function.  . The importance of regular exercise and dietary modification was stressed to the patient.  - amLODipine (NORVASC) 5 MG tablet; Take 1 tablet (5 mg total) by mouth daily. Patient currently not taking it due to not having anymore  Dispense: 90 tablet; Refill: 1 - BMP8+eGFR  2. Vitamin D deficiency  Will check vitamin D level and supplement as needed.     Also encouraged to spend 15 minutes in the sun daily.  - Vitamin D 1,25 Dihydroxy  3. Mixed hyperlipidemia  Chronic, controlled  Continue with current medications - Lipid panel  4. Prediabetes  Chronic, fair control  No current medications  Encouraged to limit intake of sugary foods and drinks  Encouraged to increase physical activity to 150 minutes per week - Hemoglobin A1c  5. Dementia with behavioral disturbance, unspecified dementia type ([place])  Chronic, continue current medications  His wife reports she is having  a challenging time with him taking his medications and unfortunately she is sick herself.   I am referring him to CCM for dementia assistance and the possibility of placement especially if his wife ends up in the hospital - Referral to [place]     [name], FNP

## 2018-07-12 ENCOUNTER — Ambulatory Visit: Payer: Self-pay

## 2018-07-12 DIAGNOSIS — I10 Essential (primary) hypertension: Secondary | ICD-10-CM

## 2018-07-12 DIAGNOSIS — R7303 Prediabetes: Secondary | ICD-10-CM

## 2018-07-12 DIAGNOSIS — F0391 Unspecified dementia with behavioral disturbance: Secondary | ICD-10-CM

## 2018-07-12 NOTE — Chronic Care Management (AMB) (Signed)
  Care Management Note   [name] is a 73 y.o. year old male who sees [name], [place] for primary care. Mrs. [name] asked the CM team to consult with the patient for assistance with Level of Care Concerns.   Review of patient status, including review of consultants reports, and collaboration with appropriate care team members and the patient's provider was performed as part of comprehensive patient evaluation and provision of chronic care management services. Telephone outreach to patient today to introduce CCM services.   I reached out to Corky Sox by phone today. I was able to make contact with patients wife who is interested in learning more about the CCM program. SW scheduled appointment for Monday 2/17 at 11:00 am.   [name], Arita Miss, [place] Management Social Worker [phone]

## 2018-07-16 ENCOUNTER — Ambulatory Visit: Payer: Self-pay

## 2018-07-18 ENCOUNTER — Ambulatory Visit: Payer: Self-pay

## 2018-07-19 ENCOUNTER — Ambulatory Visit: Payer: Self-pay

## 2018-07-19 ENCOUNTER — Ambulatory Visit: Payer: Medicare Other

## 2018-07-19 NOTE — Chronic Care Management (AMB) (Signed)
  Chronic Care Management   Social Work Note  07/19/2018 Name: [name] MRN: [id number] DOB: 1945/11/26  Incoming call from Mrs. [name] who reports she is still not feeling well. Requests to cancel today's appointment. SW attempted to schedule face to face for the week of 2/24. [name] requests to outreach this SW on "Monday" to reschedule appointment.  Follow Up Plan: Client/spouse will call SW Monday to reschedule appointment.  [name], BSW, CDP [facility] / [facility] Care Management Social Worker [phone]

## 2018-07-24 ENCOUNTER — Encounter: Payer: Self-pay | Admitting: Nurse Practitioner

## 2018-07-26 ENCOUNTER — Ambulatory Visit: Payer: Self-pay

## 2018-07-26 ENCOUNTER — Ambulatory Visit (INDEPENDENT_AMBULATORY_CARE_PROVIDER_SITE_OTHER): Payer: Medicare Other

## 2018-07-26 DIAGNOSIS — R7303 Prediabetes: Secondary | ICD-10-CM

## 2018-07-26 DIAGNOSIS — I10 Essential (primary) hypertension: Secondary | ICD-10-CM

## 2018-07-26 DIAGNOSIS — E559 Vitamin D deficiency, unspecified: Secondary | ICD-10-CM | POA: Diagnosis not present

## 2018-07-26 DIAGNOSIS — F0391 Unspecified dementia with behavioral disturbance: Secondary | ICD-10-CM

## 2018-07-26 DIAGNOSIS — E782 Mixed hyperlipidemia: Secondary | ICD-10-CM | POA: Diagnosis not present

## 2018-07-26 NOTE — Chronic Care Management (AMB) (Signed)
Chronic Care Management    Clinical Social Work General Note  07/26/2018 Name: [name] MRN: [id number] DOB: 1946-03-15   Referred by: PCP, [name], FNP for assistance with medication management and disease education/management   [name] was given information about Chronic Care Management services today including:  1. CCM service includes personalized support from designated clinical staff supervised by his physician, including individualized plan of care and coordination with other care providers 2. 24/7 contact phone numbers for assistance for urgent and routine care needs. 3. Service will only be billed when office clinical staff spend 20 minutes or more in a month to coordinate care. 4. Only one practitioner may furnish and bill the service in a calendar month. 5. The patient may stop CCM services at any time (effective at the end of the month) by phone call to the office staff. 6. The patient will be responsible for cost sharing (co-pay) of up to 20% of the service fee (after annual deductible is met).  Patient agreed to services and verbal consent obtained.   Review of patient status, including review of consultants reports, relevant laboratory and other test results, and collaboration with appropriate care team members and the patient's provider was performed as part of comprehensive patient evaluation and provision of chronic care management services.    Last CCM Appointment: Initial appointment completed on today's date  SW has discussed social determinants of health with the patient. The patient has reported concerns pertaining to social isolation as well as home modification needs. The patient has future risk for transportation and nutrition barriers.   Goals Addressed      Patient Stated   . "I need a back up transportation plan" (pt-stated)       Current Barriers:  [name] Memory Deficits - the patient has dementia and does not drive . Limited social support - the  patient relies on his wife to provide transportation. The patients wife is battling cancer and concerned the patient may need alternate transportation resources during her treatment.  Clinical Social Work Clinical Goal(s):  [name] Over the next 30  days, client will work with SW to address concerns related to transportation barriers  Interventions: . Provided education to patient/caregiver regarding transportation resources  Patient Self Care Activities:  . Performs ADL's independently . Currently unable to independently drive self. Patient relies on wife for transportation needs who is currently diagnosed with cancer   Plan:  . Social Worker will assist the patient with a SCAT application  *initial goal documentation    . "I need housing repairs" (pt-stated)       Current Barriers:  . Financial constraints . Memory Deficits  Clinical Social Work Clinical Goal(s):  [name] Over the next 30 days, client will work with SW to address concerns related to water leaks and HVAC repairs.  Interventions: . Patient interviewed and appropriate assessments performed . Provided patient with information about available community resources to assist with repairs  . Placed Referral to [employer] via [place]  Patient Self Care Activities:  . Performs ADL's independently  Plan:  . Social Worker will follow up with the patient regarding referral status to [employer] . Social Worker Geneticist, molecular the patient to other [employer] as approrpiate to assist with housing needs  *initial goal documentation    . "I need to eat better meals" (pt-stated)       Current Barriers:  [name] Memory Deficits - difficulty remembering to eat  . Limited  social support - The patients spouse is undergoing cancer treatment and has days she does not feel well enough to prepare meals. No other local supportive family and/or friends . Limitation with iADL's - reported to "snack"  rather than eating nutritious meals on days patients spouse is unable to prepare meals  Clinical Social Work Clinical Goal(s):  [name] Over the next 10 days, client will work with SW to address concerns related to nutrition  Interventions: . Patient interviewed and appropriate assessments performed . Provided patient with information about meals on wheels  Patient Self Care Activities:  . Performs ADL's independently . Calls provider office for new concerns or questions . Currently unable to independently  prepare own meals  Plan:  . Social Worker will place patient referral to Warden/ranger for [employer] on Wheels program via [place]  . Social Worker will follow up with the patient in the next 10 days to discuss status of referral  *initial goal documentation    . "I want to be more active" (pt-stated)       Current Barriers:  . Social Isolation . Memory Deficits  Clinical Social Work Clinical Goal(s):  [name] Over the next 10 days, client will work with SW to address concerns related to social isolation  . Over the next 30 days, patient will engage in a social activity  Interventions: . Patient interviewed and appropriate assessments performed . Provided patient with information about local senior centers and adult day programs  Patient Self Care Activities:  . Performs ADL's independently . Calls provider office for new concerns or questions  Plan:  . Social Worker will follow up with the patient to continue reviewing resources provided during face to face visit  . Patient will engage in a community program to prevent isolation  *initial goal documentation    . "I want to know more about my military benefits" (pt-stated)       Current Barriers:  [name] Memory Deficits . Limited education about VA benefits   Clinical Social Work Clinical Goal(s):  [name] Over the next 30 days, client will work with SW to address concerns related to insurance  benefits  Interventions: . Patient interviewed and appropriate assessments performed . Discussed plans with patient for ongoing care management follow up and provided patient with direct contact information for care management team  Patient Self Care Activities:  . Performs ADL's independently . Calls provider office for new concerns or questions  Plan:  . Social Worker will Chief Financial Officer office to inquire patients eligibility for specific programs such as aide and attendance   *initial goal documentation    . "I want to name a power of attorney" (pt-stated)       Current Barriers:  . Limited education about Advance Directives  Clinical Social Work Clinical Goal(s):  [name] Over the next 10 days, patient will work with SW to understand the importance of advance directives . Over the next 45 days, patient will complete advance directives and provide a copy to his health care provider.  Interventions: . Provided education and assistance to client regarding Advanced Directives.  . Provided a blank copy of an advance directive to the patient for completion  Patient Self Care Activities:  . Performs ADL's independently . Calls provider office for new concerns or questions  Plan:  . Patient will  review provided advance directive packet . Social Worker will follow up with the patient via phone call in the next 10 days to assist  with completion of advance directive  *initial goal documentation         Follow Up Plan: Appointment scheduled for SW follow up with client by phone within the next two weeks.       [name], BSW, CDP [facility] / [facility] Care Management Social Worker [phone]  Total time spent performing care coordination and/or care management activities with the patient by phone or face to face = 90 minutes.

## 2018-07-26 NOTE — Patient Instructions (Signed)
Social Worker Visit Information  Goals we discussed today:  Goals Addressed            This Visit's Progress     Patient Stated   . "I need a back up transportation plan" (pt-stated)       Current Barriers:  [name] Memory Deficits - the patient has dementia and does not drive . Limited social support - the patient relies on his wife to provide transportation. The patients wife is battling cancer and concerned the patient may need alternate transportation resources during her treatment.  Clinical Social Work Clinical Goal(s):  [name] Over the next 30  days, client will work with SW to address concerns related to transportation barriers  Interventions: . Provided education to patient/caregiver regarding transportation resources  Patient Self Care Activities:  . Performs ADL's independently . Currently unable to independently drive self. Patient relies on wife for transportation needs who is currently diagnosed with cancer    Plan:  . Social Worker will assist the patient with a SCAT application   *initial goal documentation     . "I need housing repairs" (pt-stated)       Current Barriers:  . Financial constraints . Memory Deficits  Clinical Social Work Clinical Goal(s):  [name] Over the next 30 days, client will work with SW to address concerns related to water leaks and HVAC repairs.   Interventions: . Patient interviewed and appropriate assessments performed . Provided patient with information about available community resources to assist with repairs  . Placed Referral to [employer] via [place]  Patient Self Care Activities:  . Performs ADL's independently  Plan:  . Social Worker will follow up with the patient regarding referral status to [employer] . Social Worker Geneticist, molecular the patient to other [employer] as approrpiate to assist with housing needs   *initial goal documentation     . "I need to eat better  meals" (pt-stated)       Current Barriers:  [name] Memory Deficits - difficulty remembering to eat  . Limited social support - The patients spouse is undergoing cancer treatment and has days she does not feel well enough to prepare meals. No other local supportive family and/or friends . Limitation with iADL's - reported to "snack" rather than eating nutritious meals on days patients spouse is unable to prepare meals  Clinical Social Work Clinical Goal(s):  [name] Over the next 10 days, client will work with SW to address concerns related to nutrition   Interventions: . Patient interviewed and appropriate assessments performed . Provided patient with information about meals on wheels  Patient Self Care Activities:  . Performs ADL's independently . Calls provider office for new concerns or questions . Currently unable to independently  prepare own meals  Plan:  . Social Worker will place patient referral to Warden/ranger for [employer] on Wheels program via [place]  . Social Worker will follow up with the patient in the next 10 days to discuss status of referral  *initial goal documentation     . "I want to be more active" (pt-stated)       Current Barriers:  . Social Isolation . Memory Deficits  Clinical Social Work Clinical Goal(s):  [name] Over the next 10 days, client will work with SW to address concerns related to social isolation  . Over the next 30 days, patient will engage in a social activity  Interventions: . Patient interviewed and appropriate assessments performed . Provided  patient with information about local senior centers and adult day programs  Patient Self Care Activities:  . Performs ADL's independently . Calls provider office for new concerns or questions  Plan:  . Social Worker will follow up with the patient to continue reviewing resources provided during face to face visit  . Patient will engage in a community program to prevent isolation  *initial  goal documentation     . "I want to know more about my military benefits" (pt-stated)       Current Barriers:  [name] Memory Deficits . Limited education about VA benefits   Clinical Social Work Clinical Goal(s):  [name] Over the next 30 days, client will work with SW to address concerns related to insurance benefits   Interventions: . Patient interviewed and appropriate assessments performed . Discussed plans with patient for ongoing care management follow up and provided patient with direct contact information for care management team  Patient Self Care Activities:  . Performs ADL's independently . Calls provider office for new concerns or questions  Plan:  . Social Worker will Chief Financial Officer office to inquire patients eligibility for specific programs such as aide and attendance   *initial goal documentation     . "I want to name a power of attorney" (pt-stated)       Current Barriers:  . Limited education about Advance Directives  Clinical Social Work Clinical Goal(s):  [name] Over the next 10 days, patient will work with SW to understand the importance of advance directives . Over the next 45 days, patient will complete advance directives and provide a copy to his health care provider.   Interventions: . Provided education and assistance to client regarding Advanced Directives.  . Provided a blank copy of an advance directive to the patient for completion  Patient Self Care Activities:  . Performs ADL's independently . Calls provider office for new concerns or questions  Plan:  . Patient will  review provided advance directive packet . Social Worker will follow up with the patient via phone call in the next 10 days to assist with completion of advance directive  *initial goal documentation         Materials provided: Yes - patient provided with information on local senior centers, adult day programs, advance directives, and Alzheimer's/Dementia   literature  [name] was given information about Chronic Care Management services today including:  1. CCM service includes personalized support from designated clinical staff supervised by his physician, including individualized plan of care and coordination with other care providers 2. 24/7 contact phone numbers for assistance for urgent and routine care needs. 3. Service will only be billed when office clinical staff spend 20 minutes or more in a month to coordinate care. 4. Only one practitioner may furnish and bill the service in a calendar month. 5. The patient may stop CCM services at any time (effective at the end of the month) by phone call to the office staff. 6. The patient will be responsible for cost sharing (co-pay) of up to 20% of the service fee (after annual deductible is met).  Patient agreed to services and verbal consent obtained.   The patient verbalized understanding of instructions provided today and declined a print copy of patient instruction materials.   Follow up plan: Appointment scheduled for SW follow up with client by phone within the next two weeks.   [name], BSW, CDP [facility] / [facility] Care Management Social Worker [phone]

## 2018-07-26 NOTE — Chronic Care Management (AMB) (Signed)
Chronic Care Management   Initial Visit Note  07/26/2018 Name: [name] MRN: [id number] DOB: 01-Sep-1945  Referred by: [name], FNP Reason for referral : Hypertension, Memory Loss (INITIAL FACE TO FACE CCM VISIT)   Subjective: "I don't like taking medications but I will try to do better"  Objective: manual resting BP taken during visit: 170/100 Rt arm   Last Annual Wellness Visit Date: Completed on 05/05/17  Next Scheduled Annual Wellness Visit Date: due now, waiting to be rescheduled  # ED visits/last 6 months: 0 # IP Hospitalizations/ last 6 months:  0   Assessment: [name]. Desai is a 73 y.o. year old male who sees [name], FNP for primary care. [name] asked the CCM team to consult the patient for assistance with chronic disease management related to Essential hypertension, pre-diabetes, dementia with behavioral disturbance.  Review of patient status, including review of consultants reports, relevant laboratory and other test results, and collaboration with appropriate care team members and the patient's provider was performed as part of comprehensive patient evaluation and provision of chronic care management services.   I met with Mr. [name] today for his initial CCM face to face visit. He was accompanied by his wife [name].     Goals    . "He needs to have his eyes checked and needs to see the dentist"     Current Barriers:  [name] Knowledge Deficit related to appropriate providers for Ophthalmology and Dental exams . Impaired cognitive ability and memory related to Dementia  Nurse Case Manager Clinical Goal(s):  [name] Over the next 30 days, patient will attend all scheduled medical appointments: patient will have new patient appointments scheduled with Ophthalmoloy and Dentist  Interventions:   Face to Face visit completed with patient and wife [name] with provider [name], FNP via in basket, requesting referrals for Ophthalmology  and Dentist  Provided RNCM contact number and 24/7 nurse advise line phone number; wife will call if needed  Telephone CCM follow-up scheduled with wife [name]  Patient Self Care Activities:   Verbalizes understanding of today's information/instructions . Currently UNABLE TO independently schedule and attend MD appointments independently  Plan:  . RNCM will follow up with wife [name] by phone in 1-2 weeks  *initial goal documentation    . "I don't like taking medications but  I will try to do better" (pt-stated)     Current Barriers:   Knowledge deficit related to importance of taking medications exactly as prescribed  Impaired cognitive ability and memory loss related to Dementia   Nurse Case Manager Clinical Goal(s):  [name] Over the next 30 days, patient will demonstrate improved adherence to prescribed treatment plan for all health related issues as evidenced by patient/wife will report patient is taking all of his medications exactly as prescribed without missed doses.   Interventions:   Face to Face visit completed today with patient and wife [name] . Reviewed medications with patient/wife and discussed indications for use and importance of taking all medications exactly as prescribed  . Medication reconciliation completed with 1 discrepancy noted; instructed wife to pick up new Rx for Amlodipine as soon as ready today and to administer the patient' first dose today; discussed indication/frequency for elevated BP . Education provided related potential SE for Amlodipine when first starting, such as light headedness, drowsiness (instructed patient to change positions slowly and avoid bending over until his body adjusts to this medication) . Outbound call to preferred pharmacy listed to ensure  new Rx is ready for pick up (pharmacist [place], confirmed) . Assessed for current system put in to place by wife [name] for patient's medication administration and routine for  administering . Assessed for financial hardship related to affording medications . Obtained phone # for [facility] from wife for provider to fax in new Rx for 90 days for Amlodipine (message sent to [name], FNP via in basket message) . Scheduled telephone CCM follow-up with wife [name]  Patient Self Care Activities:   Wife and patient verbalize understanding of today's education/instructions provided  . Currently UNABLE TO independently self-administers medications  Plan:  . RNCM will follow up with wife [name] by telephone in 1-2 weeks  *initial goal documentation    . "I don't think I have high blood pressure" (pt-stated)     Current Barriers:  [name] Knowledge deficit related to disease process and self-health management . Impaired cognitive ability and memory loss related to Dementia  Nurse Case Manager Clinical Goal(s):  [name] Over the next 30 days, patient will demonstrate improved adherence to prescribed treatment plan for Hypertension as evidenced bypatient/wife will report patient is taking his Amlodipine exactly as prescribed w/o missed doses  . Patient's wife will report monitoring patient's BP at least 2-3 times weekly and will record his readings  Interventions:   Face to Face visit completed with patient and wife [name]  Evaluation of current treatment plan related to Hypertension and patient's adherence to plan as established by provider . Assessed for patient/wife's knowledge related to what HBP is and the potential complications if left untreated . Provided education about this disease process and complications . Provided education on s/s of a Stroke and when to call the doctor or 911 (taught the FAST acronym) . Assessed wife's ability to monitor patient's BP at home and instructed to record BP readings in [facility] blue notebook provided . Discussed normal parameters and goal for regulating BP . Verbal education provided related to the importance of following a low  sodium diet, provided written document on foods to avoid, and how to read labels, provided tips on using herbs instead of salt, removing salt shaker from table and avoiding canned foods if possible . Follow up call scheduled with wife [name] . Provided RNCM contact number and 24/7 nurse advise line phone number . Sent in basket message to provider [name] re: need for Amlodipine to be sent to [facility] for 90 day supply mail order  Patient Self Care Activities:   Wife and patient verbalize understanding of today's education/instructions provided  . Currently UNABLE TO independently self-administers medications  Plan:  . RNCM will follow up with wife [name] in 1-2 weeks  *initial goal documentation    . "I need a back up transportation plan" (pt-stated)     Current Barriers:  [name] Memory Deficits - the patient has dementia and does not drive . Limited social support - the patient relies on his wife to provide transportation. The patients wife is battling cancer and concerned the patient may need alternate transportation resources during her treatment.  Clinical Social Work Clinical Goal(s):  [name] Over the next 30  days, client will work with SW to address concerns related to transportation barriers  Interventions: . Provided education to patient/caregiver regarding transportation resources  Patient Self Care Activities:  . Performs ADL's independently . Currently unable to independently drive self. Patient relies on wife for transportation needs who is currently diagnosed with cancer   Plan:  . Social Worker will  assist the patient with a [employer] application  *initial goal documentation    . "I need housing repairs" (pt-stated)     Current Barriers:  . Financial constraints . Memory Deficits  Clinical Social Work Clinical Goal(s):  [name] Over the next 30 days, client will work with SW to address concerns related to water leaks and HVAC repairs.  Interventions: . Patient  interviewed and appropriate assessments performed . Provided patient with information about available community resources to assist with repairs  . Placed Referral to [employer] via [place]  Patient Self Care Activities:  . Performs ADL's independently  Plan:  . Social Worker will follow up with the patient regarding referral status to [employer] . Social Worker Geneticist, molecular the patient to other [employer] as approrpiate to assist with housing needs   *initial goal documentation    . "I need to eat better meals" (pt-stated)     Current Barriers:  [name] Memory Deficits - difficulty remembering to eat  . Limited social support - The patients spouse is undergoing cancer treatment and has days she does not feel well enough to prepare meals. No other local supportive family and/or friends . Limitation with iADL's - reported to "snack" rather than eating nutritious meals on days patients spouse is unable to prepare meals  Clinical Social Work Clinical Goal(s):  [name] Over the next 10 days, client will work with SW to address concerns related to nutrition  Interventions: . Patient interviewed and appropriate assessments performed . Provided patient with information about meals on wheels  Patient Self Care Activities:  . Performs ADL's independently . Calls provider office for new concerns or questions . Currently unable to independently  prepare own meals  Plan:  . Social Worker will place patient referral to Warden/ranger for [employer] on Wheels program via [place]  . Social Worker will follow up with the patient in the next 10 days to discuss status of referral  *initial goal documentation    . "I want to be more active" (pt-stated)     Current Barriers:  . Social Isolation . Memory Deficits  Clinical Social Work Clinical Goal(s):  [name] Over the next 10 days, client will work with SW to address concerns related to  social isolation  . Over the next 30 days, patient will engage in a social activity  Interventions: . Patient interviewed and appropriate assessments performed . Provided patient with information about local senior centers and adult day programs  Patient Self Care Activities:  . Performs ADL's independently . Calls provider office for new concerns or questions  Plan:  . Social Worker will follow up with the patient to continue reviewing resources provided during face to face visit  . Patient will engage in a community program to prevent isolation  *initial goal documentation    . "I want to know more about my military benefits" (pt-stated)     Current Barriers:  [name] Memory Deficits . Limited education about [employer] benefits   Clinical Social Work Clinical Goal(s):  [name] Over the next 30 days, client will work with SW to address concerns related to insurance benefits  Interventions: . Patient interviewed and appropriate assessments performed . Discussed plans with patient for ongoing care management follow up and provided patient with direct contact information for care management team  Patient Self Care Activities:  . Performs ADL's independently . Calls provider office for new concerns or questions  Plan:  . Social Worker will contact  local [employer] office to inquire patients eligibility for specific programs such as aide and attendance   *initial goal documentation    . "I want to name a power of attorney" (pt-stated)     Current Barriers:  . Limited education about Advance Directives  Clinical Social Work Clinical Goal(s):  [name] Over the next 10 days, patient will work with SW to understand the importance of advance directives . Over the next 45 days, patient will complete advance directives and provide a copy to his health care provider.   Interventions: . Provided education and assistance to client regarding Advanced Directives.  . Provided a blank copy of an advance  directive to the patient for completion  Patient Self Care Activities:  . Performs ADL's independently . Calls provider office for new concerns or questions  Plan:  . Patient will  review provided advance directive packet . Social Worker will follow up with the patient via phone call in the next 10 days to assist with completion of advance directive  *initial goal documentation     Mr. Williamson and wife [name] was given information about Chronic Care Management services today including:  1. CCM service includes personalized support from designated clinical staff supervised by his physician, including individualized plan of care and coordination with other care providers 2. 24/7 contact phone numbers for assistance for urgent and routine care needs. 3. Service will only be billed when office clinical staff spend 20 minutes or more in a month to coordinate care. 4. Only one practitioner may furnish and bill the service in a calendar month. 5. The patient may stop CCM services at any time (effective at the end of the month) by phone call to the office staff. 6. The patient will be responsible for cost sharing (co-pay) of up to 20% of the service fee (after annual deductible is met).   Patient/wife agreed to services and verbal consent obtained.   Telephone follow up appointment with CCM team member scheduled for: 1-2 weeks  Barb Merino, [facility] Care Management Coordinator [facility]  Direct Phone: [phone]

## 2018-07-27 ENCOUNTER — Other Ambulatory Visit: Payer: Self-pay | Admitting: Nurse Practitioner

## 2018-07-27 ENCOUNTER — Telehealth: Payer: Self-pay

## 2018-07-27 LAB — BMP8+EGFR
BUN / CREAT RATIO: 13 (ref 10–24)
BUN: 15 mg/dL (ref 8–27)
CHLORIDE: 106 mmol/L (ref 96–106)
CO2: 22 mmol/L (ref 20–29)
CREATININE: 1.15 mg/dL (ref 0.76–1.27)
Calcium: 9.7 mg/dL (ref 8.6–10.2)
GFR calc Af Amer: 73 mL/min/{1.73_m2} (ref >59)
GFR calc non Af Amer: 63 mL/min/{1.73_m2} (ref >59)
GLUCOSE: 84 mg/dL (ref 65–99)
Potassium: 5 mmol/L (ref 3.5–5.2)
SODIUM: 142 mmol/L (ref 134–144)

## 2018-07-27 LAB — LIPID PANEL
CHOLESTEROL TOTAL: 160 mg/dL (ref 100–199)
Chol/HDL Ratio: 3.7 ratio (ref 0.0–5.0)
HDL: 43 mg/dL (ref >39)
LDL Calculated: 100 mg/dL — ABNORMAL HIGH (ref 0–99)
TRIGLYCERIDES: 87 mg/dL (ref 0–149)
VLDL Cholesterol Cal: 17 mg/dL (ref 5–40)

## 2018-07-27 LAB — VITAMIN D 25 HYDROXY (VIT D DEFICIENCY, FRACTURES): VIT D 25 HYDROXY: 4.7 ng/mL — AB (ref 30.0–100.0)

## 2018-07-27 LAB — HEMOGLOBIN A1C
ESTIMATED AVERAGE GLUCOSE: 117 mg/dL
Hgb A1c MFr Bld: 5.7 % — ABNORMAL HIGH (ref 4.8–5.6)

## 2018-07-27 MED ORDER — VITAMIN D (ERGOCALCIFEROL) 1.25 MG (50000 UNIT) PO CAPS: 50000.0000 [IU] | ORAL_CAPSULE | ORAL | 1 refills | Status: DC

## 2018-07-27 NOTE — Patient Instructions (Signed)
Visit Information  Goals      Patient Stated   . "I don't like taking medications but  I will try to do better" (pt-stated)     Current Barriers:   Knowledge deficit related to importance of taking medications exactly as prescribed  Impaired cognitive ability and memory loss related to Dementia  Nurse Case Manager Clinical Goal(s):  [name] Over the next 30 days, patient will demonstrate improved adherence to prescribed treatment plan for all health related issues as evidenced by patient/wife will report patient is taking all of his medications exactly as prescribed without missed doses.   Interventions:   Face to Face visit completed today with patient and wife [name] . Reviewed medications with patient/wife and discussed indications for use and importance of taking all medications exactly as prescribed  . Medication reconciliation completed with 1 discrepancy noted; instructed wife to pick up new Rx for Amlodipine as soon as ready today and to administer the patient' first dose today; discussed indication/frequency for elevated BP . Education provided related potential SE for Amlodipine when first starting, such as light headedness, drowsiness (instructed patient to change positions slowly and avoid bending over until his body adjusts to this medication) . Outbound call to preferred pharmacy listed to ensure new Rx is ready for pick up (pharmacist [place], confirmed) . Assessed for current system put in to place by wife [name] for patient's medication administration and routine for administering . Assessed for financial hardship related to affording medications . Obtained phone # for [facility] from wife for provider to fax in new Rx for 90 days for Amlodipine (message sent to [name], FNP via in basket message) . Scheduled telephone CCM follow-up with wife [name]  Patient Self Care Activities:   Wife and patient verbalize understanding of today's education/instructions provided   . Currently UNABLE TO independently self-administers medications  Plan:  . RNCM will follow up with wife [name] by telephone in 1-2 weeks  *initial goal documentation    . "I don't think I have high blood pressure" (pt-stated)     Current Barriers:  [name] Knowledge deficit related to disease process and self-health management . Impaired cognitive ability and memory loss related to Dementia  Nurse Case Manager Clinical Goal(s):  [name] Over the next 30 days, patient will demonstrate improved adherence to prescribed treatment plan for Hypertension as evidenced bypatient/wife will report patient is taking his Amlodipine exactly as prescribed w/o missed doses  . Patient's wife will report monitoring patient's BP at least 2-3 times weekly and will record his readings  Interventions:   Face to Face visit completed with patient and wife [name]  Evaluation of current treatment plan related to Hypertension and patient's adherence to plan as established by provider . Assessed for patient/wife's knowledge related to what HBP is and the potential complications if left untreated . Provided education about this disease process and complications . Provided education on s/s of a Stroke and when to call the doctor or 911 (taught the FAST acronym) . Assessed wife's ability to monitor patient's BP at home and instructed to record BP readings in [facility] blue notebook provided . Discussed normal parameters and goal for regulating BP . Verbal education provided related to the importance of following a low sodium diet, provided written document on foods to avoid, and how to read labels, provided tips on using herbs instead of salt, removing salt shaker from table and avoiding canned foods if possible . Follow up call scheduled with wife [name] .  Provided RNCM contact number and 24/7 nurse advise line phone number . Sent in basket message to provider [name] re: need for Amlodipine to be sent to [facility]  for 90 day supply mail order  Patient Self Care Activities:   Wife and patient verbalize understanding of today's education/instructions provided  . Currently UNABLE TO independently self-administers medications  Plan:  . RNCM will follow up with wife [name] in 1-2 weeks  *initial goal documentation    . "I need a back up transportation plan" (pt-stated)     Current Barriers:  [name] Memory Deficits - the patient has dementia and does not drive . Limited social support - the patient relies on his wife to provide transportation. The patients wife is battling cancer and concerned the patient may need alternate transportation resources during her treatment.  Clinical Social Work Clinical Goal(s):  [name] Over the next 30  days, client will work with SW to address concerns related to transportation barriers  Interventions: . Provided education to patient/caregiver regarding transportation resources  Patient Self Care Activities:  . Performs ADL's independently . Currently unable to independently drive self. Patient relies on wife for transportation needs who is currently diagnosed with cancer    Plan:  . Social Worker will assist the patient with a [employer] application   *initial goal documentation    . "I need housing repairs" (pt-stated)     Current Barriers:  . Financial constraints . Memory Deficits  Clinical Social Work Clinical Goal(s):  [name] Over the next 30 days, client will work with SW to address concerns related to water leaks and HVAC repairs.  Interventions: . Patient interviewed and appropriate assessments performed . Provided patient with information about available community resources to assist with repairs  . Placed Referral to [employer] via [place]  Patient Self Care Activities:  . Performs ADL's independently  Plan:  . Social Worker will follow up with the patient regarding referral status to [employer] . Social Worker  Geneticist, molecular the patient to other [employer] as approrpiate to assist with housing needs  *initial goal documentation    . "I need to eat better meals" (pt-stated)     Current Barriers:  [name] Memory Deficits - difficulty remembering to eat  . Limited social support - The patients spouse is undergoing cancer treatment and has days she does not feel well enough to prepare meals. No other local supportive family and/or friends . Limitation with iADL's - reported to "snack" rather than eating nutritious meals on days patients spouse is unable to prepare meals  Clinical Social Work Clinical Goal(s):  [name] Over the next 10 days, client will work with SW to address concerns related to nutrition   Interventions: . Patient interviewed and appropriate assessments performed . Provided patient with information about meals on wheels  Patient Self Care Activities:  . Performs ADL's independently . Calls provider office for new concerns or questions . Currently unable to independently  prepare own meals  Plan:  . Social Worker will place patient referral to Warden/ranger for [employer] on Wheels program via [place]  . Social Worker will follow up with the patient in the next 10 days to discuss status of referral  *initial goal documentation    . "I want to be more active" (pt-stated)     Current Barriers:  . Social Isolation . Memory Deficits  Clinical Social Work Clinical Goal(s):  [name] Over the next 10 days, client will  work with SW to address concerns related to social isolation  . Over the next 30 days, patient will engage in a social activity  Interventions: . Patient interviewed and appropriate assessments performed . Provided patient with information about local senior centers and adult day programs  Patient Self Care Activities:  . Performs ADL's independently . Calls provider office for new concerns or questions  Plan:  . Social Worker will follow up with  the patient to continue reviewing resources provided during face to face visit  . Patient will engage in a community program to prevent isolation  *initial goal documentation    . "I want to know more about my military benefits" (pt-stated)     Current Barriers:  [name] Memory Deficits . Limited education about VA benefits   Clinical Social Work Clinical Goal(s):  [name] Over the next 30 days, client will work with SW to address concerns related to insurance benefits  Interventions: . Patient interviewed and appropriate assessments performed . Discussed plans with patient for ongoing care management follow up and provided patient with direct contact information for care management team  Patient Self Care Activities:  . Performs ADL's independently . Calls provider office for new concerns or questions  Plan:  . Social Worker will Chief Financial Officer office to inquire patients eligibility for specific programs such as aide and attendance   *initial goal documentation    . "I want to name a power of attorney" (pt-stated)     Current Barriers:  . Limited education about Advance Directives  Clinical Social Work Clinical Goal(s):  [name] Over the next 10 days, patient will work with SW to understand the importance of advance directives . Over the next 45 days, patient will complete advance directives and provide a copy to his health care provider.  Interventions: . Provided education and assistance to client regarding Advanced Directives.  . Provided a blank copy of an advance directive to the patient for completion  Patient Self Care Activities:  . Performs ADL's independently . Calls provider office for new concerns or questions  Plan:  . Patient will  review provided advance directive packet . Social Worker will follow up with the patient via phone call in the next 10 days to assist with completion of advance directive  *initial goal documentation      Other   . "He needs  to have his eyes checked and needs to see the dentist"     Current Barriers:  [name] Knowledge Deficit related to appropriate providers for Ophthalmology and Dental exams . Impaired cognitive ability and memory related to Dementia   Nurse Case Manager Clinical Goal(s):  [name] Over the next 30 days, patient will attend all scheduled medical appointments: patient will have new patient appointments scheduled with Ophthalmoloy and Dentist  Interventions:   Face to Face visit completed with patient and wife [name] with provider [name], FNP via in basket, requesting referrals for Ophthalmology and Dentist  Provided RNCM contact number and 24/7 nurse advise line phone number; wife will call if needed  Telephone CCM follow-up scheduled with wife [name]   Patient Self Care Activities:   Verbalizes understanding of today's information/instructions . Currently UNABLE TO independently schedule and attend MD appointments independently  Plan:  . RNCM will follow up with wife [name] by phone in 1-2 weeks  *initial goal documentation    Education or Materials Provided:  . [facility] Blue Calendar Notebook . Low Sodium (foods to avoid, foods to  eat) sheet . Hypertension notebook mailed to wife  Mr. Bannan and wife [name] was given information about Chronic Care Management services today including:  1. CCM service includes personalized support from designated clinical staff supervised by his physician, including individualized plan of care and coordination with other care providers 2. 24/7 contact phone numbers for assistance for urgent and routine care needs. 3. Service will only be billed when office clinical staff spend 20 minutes or more in a month to coordinate care. 4. Only one practitioner may furnish and bill the service in a calendar month. 5. The patient may stop CCM services at any time (effective at the end of the month) by phone call to the office staff. 6. The patient  will be responsible for cost sharing (co-pay) of up to 20% of the service fee (after annual deductible is met).  Patient agreed to services and verbal consent obtained.   The patient verbalized understanding of instructions provided today and declined a print copy of patient instruction materials.   The CM team will reach out to the patient again over the next 7-14 days days.   Barb Merino, RN,CCM Care Management Coordinator [place] [facility]  Direct Phone: [phone]

## 2018-07-27 NOTE — Telephone Encounter (Signed)
-----   Message from [name], [place] sent at 07/27/2018  8:51 AM EST ----- Kidney functions are normal.  Total cholesterol is normal, triglycerides are normal.  LDL is 100 this is not too bad.  HgbA1c is 5.7 this is stable.  Vitamin d is really low I will send a Rx for Vitamin d 50,000 units twice a week and we will recheck this in 3 months, this can have an affect on your memory.

## 2018-08-01 ENCOUNTER — Ambulatory Visit: Payer: Self-pay

## 2018-08-01 DIAGNOSIS — R7303 Prediabetes: Secondary | ICD-10-CM

## 2018-08-01 DIAGNOSIS — I10 Essential (primary) hypertension: Secondary | ICD-10-CM

## 2018-08-01 DIAGNOSIS — F0391 Unspecified dementia with behavioral disturbance: Secondary | ICD-10-CM

## 2018-08-01 NOTE — Patient Instructions (Signed)
Social Worker Visit Information  Goals we discussed today:  Goals Addressed            This Visit's Progress     Patient Stated   . "I want to name a power of attorney" (pt-stated)   On track    Current Barriers:  . Limited education about Advance Directives  Clinical Social Work Clinical Goal(s):  [name] Over the next 10 days, patient will work with SW to understand the importance of advance directives . Over the next 45 days, patient will complete advance directives and provide a copy to his health care provider.   Interventions: . Provided education and assistance to client regarding Advanced Directives.  . Provided a blank copy of an advance directive to the patient for completion . Appointment scheduled for patient to meet with SW to review packet and assist with completion  Patient Self Care Activities:  . Performs ADL's independently . Calls provider office for new concerns or questions  Plan:  . Patient will attend scheduled face to face encounter with SW to assist with completion of advance directive  Please see past updates related to this goal by clicking on the "Past Updates" button in the selected goal          Materials Provided: No: Patient declined  Follow Up Plan: Face to Face appointment with CCM team member scheduled for: Thursday March 12th   Laysa Kimmey, [place], CDP [facility] [place] [facility] Social Worker [phone]

## 2018-08-01 NOTE — Chronic Care Management (AMB) (Signed)
  Chronic Care Management   Social Work Note  08/01/2018 Name: [name] MRN: [id number] DOB: 02/22/1946  [name] is a 73 y.o. year old male who sees [name], [place] for primary care. Mrs. [name] asked the CCM team to consult the patient for assistance with [employer], Education officer, community.   Goals Addressed            This Visit's Progress     Patient Stated   . "I want to name a power of attorney" (pt-stated)   On track    Current Barriers:  . Limited education about Advance Directives  Clinical Social Work Clinical Goal(s):  [name] Over the next 10 days, patient will work with SW to understand the importance of advance directives . Over the next 45 days, patient will complete advance directives and provide a copy to his health care provider.  Interventions: . Provided education and assistance to client regarding Advanced Directives.  . Provided a blank copy of an advance directive to the patient for completion . Appointment scheduled for patient to meet with SW to review packet and assist with completion  Patient Self Care Activities:  . Performs ADL's independently . Calls provider office for new concerns or questions  Plan:  . Patient will attend scheduled face to face encounter with SW to assist with completion of advance directive  Please see past updates related to this goal by clicking on the "Past Updates" button in the selected goal       Follow Up Plan: Appointment scheduled for SW to meet with client in provider office on: Thursday March 12th.  [name], BSW, CDP [facility] Care Management Social Worker [phone]

## 2018-08-03 ENCOUNTER — Telehealth: Payer: Self-pay

## 2018-08-03 NOTE — Telephone Encounter (Signed)
Last attempt results mailed

## 2018-08-03 NOTE — Telephone Encounter (Signed)
-----   Message from [name], [place] sent at 07/27/2018  8:51 AM EST ----- Kidney functions are normal.  Total cholesterol is normal, triglycerides are normal.  LDL is 100 this is not too bad.  HgbA1c is 5.7 this is stable.  Vitamin d is really low I will send a Rx for Vitamin d 50,000 units twice a week and we will recheck this in 3 months, this can have an affect on your memory.

## 2018-08-07 ENCOUNTER — Telehealth: Payer: Self-pay

## 2018-08-08 ENCOUNTER — Other Ambulatory Visit: Payer: Self-pay | Admitting: Nurse Practitioner

## 2018-08-08 DIAGNOSIS — I10 Essential (primary) hypertension: Secondary | ICD-10-CM

## 2018-08-09 ENCOUNTER — Ambulatory Visit (INDEPENDENT_AMBULATORY_CARE_PROVIDER_SITE_OTHER): Payer: Medicare Other

## 2018-08-09 ENCOUNTER — Ambulatory Visit: Payer: Medicare Other

## 2018-08-09 ENCOUNTER — Other Ambulatory Visit: Payer: Self-pay

## 2018-08-09 VITALS — BP 132/90 | HR 55 | Temp 97.7°F | Ht 67.2 in | Wt 146.8 lb

## 2018-08-09 DIAGNOSIS — I10 Essential (primary) hypertension: Secondary | ICD-10-CM

## 2018-08-09 DIAGNOSIS — F0391 Unspecified dementia with behavioral disturbance: Secondary | ICD-10-CM

## 2018-08-09 DIAGNOSIS — E559 Vitamin D deficiency, unspecified: Secondary | ICD-10-CM

## 2018-08-09 DIAGNOSIS — R7303 Prediabetes: Secondary | ICD-10-CM

## 2018-08-09 DIAGNOSIS — Z0000 Encounter for general adult medical examination without abnormal findings: Secondary | ICD-10-CM

## 2018-08-09 LAB — POCT UA - MICROALBUMIN
Albumin/Creatinine Ratio, Urine, POC: <30
CREATININE, POC: 300 mg/dL
MICROALBUMIN (UR) POC: 30 mg/L

## 2018-08-09 LAB — POCT URINALYSIS DIPSTICK
Bilirubin, UA: NEGATIVE
GLUCOSE UA: NEGATIVE
LEUKOCYTES UA: NEGATIVE
Nitrite, UA: NEGATIVE
PH UA: 5.5 (ref 5.0–8.0)
PROTEIN UA: NEGATIVE
RBC UA: NEGATIVE
Spec Grav, UA: 1.025 (ref 1.010–1.025)
UROBILINOGEN UA: 0.2 U/dL

## 2018-08-09 NOTE — Progress Notes (Addendum)
Subjective:   [name] is a 73 y.o. male who presents for Medicare Annual/Subsequent preventive examination. n/a Review of Systems:   Cardiac Risk Factors include: advanced age (>39men, >63 women);dyslipidemia;male gender;hypertension;sedentary lifestyle     Objective:    Vitals: BP 132/90 (BP Location: Right Arm, Patient Position: Sitting)   Pulse (!) 55   Temp 97.7 F (36.5 C) (Oral)   Ht 5' 7.2" (1.707 m)   Wt 146 lb 12.8 oz (66.6 kg)   SpO2 98%   BMI 22.86 kg/m   Body mass index is 22.86 kg/m.  Advanced Directives 08/09/2018 07/26/2018  Does Patient Have a Medical Advance Directive? Yes No  Type of Paramedic of [place];Living will -  Does patient want to make changes to medical advance directive? No - Patient declined -  Copy of [place] in Chart? No - copy requested -  Would patient like information on creating a medical advance directive? - Yes (MAU/Ambulatory/Procedural Areas - Information given)    Tobacco Social History   Tobacco Use  Smoking Status Former Smoker  . Types: Cigarettes  Smokeless Tobacco Never Used     Counseling given: Not Answered   Clinical Intake:  Pre-visit preparation completed: Yes  Pain : No/denies pain Pain Score: 0-No pain     Nutritional Status: BMI of 19-24  Normal Nutritional Risks: None Diabetes: No  How often do you need to have someone help you when you read instructions, pamphlets, or other written materials from your doctor or pharmacy?: 5 - Always What is the last grade level you completed in school?: Associates degree  Interpreter Needed?: No  Information entered by :: NAllen LPN  Past Medical History:  Diagnosis Date  . Hyperlipidemia   . Hypertension    Past Surgical History:  Procedure Laterality Date  . APPENDECTOMY     History reviewed. No pertinent family history. Social History   Socioeconomic History  . Marital status: Married    Spouse  name: Not on file  . Number of children: 3  . Years of education: Not on file  . Highest education level: Associate degree: occupational, Hotel manager, or vocational program  Occupational History  . Occupation: retired  Scientific laboratory technician  . Financial resource strain: Not very hard  . Food insecurity:    Worry: Never true    Inability: Never true  . Transportation needs:    Medical: No    Non-medical: No  Tobacco Use  . Smoking status: Former Smoker    Types: Cigarettes  . Smokeless tobacco: Never Used  Substance and Sexual Activity  . Alcohol use: Not Currently  . Drug use: Not Currently  . Sexual activity: Not Currently  Lifestyle  . Physical activity:    Days per week: 0 days    Minutes per session: 0 min  . Stress: Not at all  Relationships  . Social connections:    Talks on phone: Not on file    Gets together: Not on file    Attends religious service: Not on file    Active member of club or organization: Not on file    Attends meetings of clubs or organizations: Not on file    Relationship status: Not on file  Other Topics Concern  . Not on file  Social History Narrative  . Not on file    Outpatient Encounter Medications as of 08/09/2018  Medication Sig  . amLODipine (NORVASC) 5 MG tablet Take 1 tablet (5 mg total)  by mouth daily. Patient currently not taking it due to not having anymore  . aspirin EC 81 MG tablet Take 81 mg by mouth daily.  [name] escitalopram (LEXAPRO) 5 MG tablet Take 1 tablet (5 mg total) by mouth daily.  . Memantine HCl-Donepezil HCl (NAMZARIC) 28-10 MG CP24 Take 1 capsule by mouth daily.  . pravastatin (PRAVACHOL) 20 MG tablet Take 20 mg by mouth daily.  . Vitamin D, Ergocalciferol, (DRISDOL) 1.25 MG (50000 UT) CAPS capsule Take 1 capsule (50,000 Units total) by mouth every 7 (seven) days.   No facility-administered encounter medications on file as of 08/09/2018.     Activities of Daily Living In your present state of health, do you have any  difficulty performing the following activities: 08/09/2018 07/26/2018  Hearing? N N  Vision? N Y  Difficulty concentrating or making decisions? Tempie Donning  Walking or climbing stairs? N N  Dressing or bathing? Y N  Comment needs encouragement -  Doing errands, shopping? Y Y  Comment spouse takes to appointments -  Preparing Food and eating ? N N  Using the Toilet? N N  In the past six months, have you accidently leaked urine? N N  Do you have problems with loss of bowel control? N N  Managing your Medications? Y Y  Comment - early stages of dementia, wife assists with med management. Patient noncompliant at times  Managing your Finances? Y Y  Comment - wife manages due to dementia diagnosis  Housekeeping or managing your Housekeeping? Tempie Donning  Comment - wife manages housekeeping activites  Some recent data might be hidden    Patient Care Team: [name], FNP as PCP - General ([place]) [name] as Social Worker [place], [name], RN as Case Manager   Assessment:   This is a routine wellness examination for [facility].  Exercise Activities and Dietary recommendations Current Exercise Habits: The patient does not participate in regular exercise at present  Goals    . "He needs to have his eyes checked and needs to see the dentist"     Current Barriers:  [name] Knowledge Deficit related to appropriate providers for Ophthalmology and Dental exams . Impaired cognitive ability and memory related to Dementia   Nurse Case Manager Clinical Goal(s):  [name] Over the next 30 days, patient will attend all scheduled medical appointments: patient will have new patient appointments scheduled with Ophthalmoloy and Dentist  Interventions:   Face to Face visit completed with patient and wife [name] with provider [name], FNP via in basket, requesting referrals for Ophthalmology and Dentist  Provided RNCM contact number and 24/7 nurse advise line phone number; wife will call  if needed  Telephone CCM follow-up scheduled with wife [name]   Patient Self Care Activities:   Verbalizes understanding of today's information/instructions . Currently UNABLE TO independently schedule and attend MD appointments independently  Plan:  . RNCM will follow up with wife [name] by phone in 1-2 weeks   *initial goal documentation      . "I don't like taking medications but  I will try to do better" (pt-stated)     Current Barriers:   Knowledge deficit related to importance of taking medications exactly as prescribed  Impaired cognitive ability and memory loss related to Dementia   Nurse Case Manager Clinical Goal(s):  [name] Over the next 30 days, patient will demonstrate improved adherence to prescribed treatment plan for all health related issues as evidenced by patient/wife will report patient  is taking all of his medications exactly as prescribed without missed doses.   Interventions:   Face to Face visit completed today with patient and wife [name] . Reviewed medications with patient/wife and discussed indications for use and importance of taking all medications exactly as prescribed  . Medication reconciliation completed with 1 discrepancy noted; instructed wife to pick up new Rx for Amlodipine as soon as ready today and to administer the patient' first dose today; discussed indication/frequency for elevated BP . Education provided related potential SE for Amlodipine when first starting, such as light headedness, drowsiness (instructed patient to change positions slowly and avoid bending over until his body adjusts to this medication) . Outbound call to preferred pharmacy listed to ensure new Rx is ready for pick up (pharmacist [place], confirmed) . Assessed for current system put in to place by wife [name] for patient's medication administration and routine for administering . Assessed for financial hardship related to affording medications . Obtained phone # for  [facility] from wife for provider to fax in new Rx for 90 days for Amlodipine (message sent to [name], FNP via in basket message) . Scheduled telephone CCM follow-up with wife [name]  Patient Self Care Activities:   Wife and patient verbalize understanding of today's education/instructions provided  . Currently UNABLE TO independently self-administers medications  Plan:  . RNCM will follow up with wife [name] by telephone in 1-2 weeks   *initial goal documentation      . "I don't think I have high blood pressure" (pt-stated)     Current Barriers:  [name] Knowledge deficit related to disease process and self-health management . Impaired cognitive ability and memory loss related to Dementia  Nurse Case Manager Clinical Goal(s):  [name] Over the next 30 days, patient will demonstrate improved adherence to prescribed treatment plan for Hypertension as evidenced bypatient/wife will report patient is taking his Amlodipine exactly as prescribed w/o missed doses  . Patient's wife will report monitoring patient's BP at least 2-3 times weekly and will record his readings  Interventions:   Face to Face CCM follow up visit completed with patient and wife [name]  Evaluation of current treatment plan related to Hypertension and patient's adherence to plan as established by provider . Assessed for patient/wife's knowledge related to what HBP is and the potential complications if left untreated . Provided education about this disease process and complications . Provided education on s/s of a Stroke and when to call the doctor or 911 (taught the FAST acronym) . Assessed wife's ability to monitor patient's BP at home and instructed to record BP readings in THN blue notebook provided . Discussed normal parameters and goal for regulating BP . Verbal education provided related to the importance of following a low sodium diet, provided written document on foods to avoid, and how to read labels,  provided tips on using herbs instead of salt, removing salt shaker from table and avoiding canned foods if possible . Follow up call scheduled with wife [name] . Provided RNCM contact number and 24/7 nurse advise line phone number . Sent in basket message to provider [name] re: need for Amlodipine to be sent to [facility] for 90 day supply mail order  Patient Self Care Activities:   Wife and patient verbalize understanding of today's education/instructions provided  . Currently UNABLE TO independently self-administers medications  Plan:  . RNCM will follow up with wife [name] in 1-2 weeks   *initial goal documentation      . "  I need a back up transportation plan" (pt-stated)     Current Barriers:  [name] Memory Deficits - the patient has dementia and does not drive . Limited social support - the patient relies on his wife to provide transportation. The patients wife is battling cancer and concerned the patient may need alternate transportation resources during her treatment.  Clinical Social Work Clinical Goal(s):  [name] Over the next 30  days, client will work with SW to address concerns related to transportation barriers  Interventions: . Provided education to patient/caregiver regarding transportation resources  Patient Self Care Activities:  . Performs ADL's independently . Currently unable to independently drive self. Patient relies on wife for transportation needs who is currently diagnosed with cancer    Plan:  . Social Worker will assist the patient with a SCAT application   *initial goal documentation     . "I need housing repairs" (pt-stated)     Current Barriers:  . Financial constraints . Memory Deficits  Clinical Social Work Clinical Goal(s):  [name] Over the next 30 days, client will work with SW to address concerns related to water leaks and HVAC repairs.   Interventions: . Patient interviewed and appropriate assessments performed . Provided patient  with information about available community resources to assist with repairs  . Placed Referral to [employer] [facility] via [place]  Patient Self Care Activities:  . Performs ADL's independently  Plan:  . Social Worker will follow up with the patient regarding referral status to [employer] . Social Worker Geneticist, molecular the patient to other [employer] as approrpiate to assist with housing needs   *initial goal documentation     . "I need to eat better meals" (pt-stated)     Current Barriers:  [name] Memory Deficits - difficulty remembering to eat  . Limited social support - The patients spouse is undergoing cancer treatment and has days she does not feel well enough to prepare meals. No other local supportive family and/or friends . Limitation with iADL's - reported to "snack" rather than eating nutritious meals on days patients spouse is unable to prepare meals  Clinical Social Work Clinical Goal(s):  [name] Over the next 10 days, client will work with SW to address concerns related to nutrition   Interventions: . Patient interviewed and appropriate assessments performed . Provided patient with information about meals on wheels  Patient Self Care Activities:  . Performs ADL's independently . Calls provider office for new concerns or questions . Currently unable to independently  prepare own meals  Plan:  . Social Worker will place patient referral to Warden/ranger for [employer] on Wheels program via [place]  . Social Worker will follow up with the patient in the next 10 days to discuss status of referral  *initial goal documentation     . "I want to be more active" (pt-stated)     Current Barriers:  . Social Isolation . Memory Deficits  Clinical Social Work Clinical Goal(s):  [name] Over the next 10 days, client will work with SW to address concerns related to social isolation  . Over the next 30 days, patient will engage in  a social activity  Interventions: . Patient interviewed and appropriate assessments performed . Provided patient with information about local senior centers and adult day programs  Patient Self Care Activities:  . Performs ADL's independently . Calls provider office for new concerns or questions  Plan:  . Social Worker will follow up with the patient  to continue reviewing resources provided during face to face visit  . Patient will engage in a community program to prevent isolation  *initial goal documentation     . "I want to know more about my military benefits" (pt-stated)     Current Barriers:  [name] Memory Deficits . Limited education about VA benefits   Clinical Social Work Clinical Goal(s):  [name] Over the next 30 days, client will work with SW to address concerns related to insurance benefits   Interventions: . Patient interviewed and appropriate assessments performed . Discussed plans with patient for ongoing care management follow up and provided patient with direct contact information for care management team  Patient Self Care Activities:  . Performs ADL's independently . Calls provider office for new concerns or questions  Plan:  . Social Worker will Chief Financial Officer office to inquire patients eligibility for specific programs such as aide and attendance   *initial goal documentation     . "I want to name a power of attorney" (pt-stated)     Current Barriers:  . Limited education about Advance Directives  Clinical Social Work Clinical Goal(s):  [name] Over the next 10 days, patient will work with SW to understand the importance of advance directives . Over the next 45 days, patient will complete advance directives and provide a copy to his health care provider.   Interventions: . Provided education and assistance to client regarding Advanced Directives.  . Provided a blank copy of an advance directive to the patient for completion . Appointment  scheduled for patient to meet with SW to review packet and assist with completion  Patient Self Care Activities:  . Performs ADL's independently . Calls provider office for new concerns or questions  Plan:  . Patient will attend scheduled face to face encounter with SW to assist with completion of advance directive  Please see past updates related to this goal by clicking on the "Past Updates" button in the selected goal         Fall Risk Fall Risk  08/09/2018 07/09/2018 04/13/2018  Falls in the past year? 0 0 0  Comment - - Emmi Telephone Survey: data to providers prior to load  Risk for fall due to : Medication side effect - -  Follow up Education provided;Falls prevention discussed - -   Is the patient's home free of loose throw rugs in walkways, pet beds, electrical cords, etc?   yes      Grab bars in the bathroom? no      Handrails on the stairs?   yes      Adequate lighting?   yes  Timed Get Up and Go Performed: n/a  Depression Screen PHQ 2/9 Scores 08/09/2018 07/09/2018  PHQ - 2 Score 0 0    Cognitive Function MMSE - Mini Mental State Exam 07/09/2018 07/09/2018  Orientation to time 5 5  Orientation to Place 5 5  Registration 3 -  Attention/ Calculation 3 -  Recall 2 -  Language- name 2 objects 2 -  Language- repeat 1 -  Language- follow 3 step command 3 -  Language- read & follow direction 1 -  Write a sentence 1 -  Copy design 1 -  Total score 27 -     6CIT Screen 08/09/2018  What Year? 0 points  What month? 0 points  What time? 3 points  Count back from 20 0 points  Months in reverse 0 points  Repeat phrase 10 points  Total  Score 13     There is no immunization history on file for this patient.  Qualifies for Shingles Vaccine? yes  Screening Tests Health Maintenance  Topic Date Due  . INFLUENZA VACCINE  08/28/2018 (Originally 12/28/2017)  . Hepatitis C Screening  08/09/2019 (Originally 1946-03-16)  . PNA vac Low Risk Adult (1 of 2 - PCV13)  08/09/2019 (Originally 12/10/2009)  . TETANUS/TDAP  01/07/2024  . COLONOSCOPY  02/15/2028   Cancer Screenings: Lung: Low Dose CT Chest recommended if Age 90-80 years, 30 pack-year currently smoking OR have quit w/in 15years. Patient does not qualify. Colorectal: up to date  Additional Screenings:  Hepatitis C Screening:decline      Plan:   Patient declines vaccinations.  I have personally reviewed and noted the following in the patient's chart:   . Medical and social history . Use of alcohol, tobacco or illicit drugs  . Current medications and supplements . Functional ability and status . Nutritional status . Physical activity . Advanced directives . List of other physicians . Hospitalizations, surgeries, and ER visits in previous 12 months . Vitals . Screenings to include cognitive, depression, and falls . Referrals and appointments  In addition, I have reviewed and discussed with patient certain preventive protocols, quality metrics, and best practice recommendations. A written personalized care plan for preventive services as well as general preventive health recommendations were provided to patient.     Kellie Simmering, LPN  1/59/4585

## 2018-08-09 NOTE — Chronic Care Management (AMB) (Signed)
Chronic Care Management    Clinical Social Work Follow Up Note  08/09/2018 Name: [name] MRN: [id number] DOB: 03-Aug-1945  [name] is a 73 y.o. year old male who is a primary care patient of [name], [place]. The CCM team was consulted for assistance with [employer].   Review of patient status, including review of consultants reports, other relevant assessments, and collaboration with appropriate care team members and the patient's provider was performed as part of comprehensive patient evaluation and provision of chronic care management services.     Goals Addressed            This Visit's Progress     Patient Stated   . "I need a back up transportation plan" (pt-stated)   On track    Current Barriers:  [name] Memory Deficits - the patient has dementia and does not drive . Limited social support - the patient relies on his wife to provide transportation. The patients wife is battling cancer and concerned the patient may need alternate transportation resources during her treatment.  Clinical Social Work Clinical Goal(s):  [name] Over the next 30  days, client will work with SW to address concerns related to transportation barriers  Interventions: . Interviewed patient and assisted with completion of SCAT application  Patient Self Care Activities:  . Performs ADL's independently . Currently unable to independently drive self. Patient relies on wife for transportation needs who is currently diagnosed with cancer    Plan:  . Social Worker will outreach the patient in the next two weeks to assist with arranging a SCAT eligibility interview   Please see past updates related to this goal by clicking on the "Past Updates" button in the selected goal     . "I need housing repairs" (pt-stated)   Not on track    Current Barriers:  . Financial constraints . Memory Deficits  Clinical Social Work Clinical Goal(s):  [name] Over the next 30 days, client will work with SW to  address concerns related to water leaks and HVAC repairs.   Interventions: . Patient interviewed and appropriate assessments performed . Provided patient with information about available community resources to assist with repairs  . Social worker informed patient the referral to [employer] was rejected due to property value being greater than 150,000 . Social Worker obtained patient monthly income and discussed options to look at other resources that may assist  Patient Self Care Activities:  . Performs ADL's independently  Plan:  . Social Worker will review other community resource options and refer patient to appropriate agencies to assist with home modifications  Please see past updates related to this goal by clicking on the "Past Updates" button in the selected goal     . COMPLETED: "I need to eat better meals" (pt-stated)       Current Barriers:  [name] Memory Deficits - difficulty remembering to eat  . Limited social support - The patients spouse is undergoing cancer treatment and has days she does not feel well enough to prepare meals. No other local supportive family and/or friends . Limitation with iADL's - reported to "snack" rather than eating nutritious meals on days patients spouse is unable to prepare meals  Clinical Social Work Clinical Goal(s):  [name] Over the next 10 days, client will work with SW to address concerns related to nutrition  Interventions: . Successfully placed the patient on the wait list to receive mobile meals . Educated the patient on the mobile  meals program and how the waiting list works  Patient Self Care Activities:  . Performs ADL's independently . Calls provider office for new concerns or questions . Currently unable to independently  prepare own meals  Plan:  . Patient will continue to receive assistance from spouse to support with nutrition   Please see past updates related to this goal by clicking on the "Past Updates" button  in the selected goal     . "I want to name a power of attorney" (pt-stated)   On track    Current Barriers:  . Limited education about Advance Directives  Clinical Social Work Clinical Goal(s):  [name] Over the next 10 days, patient will work with SW to understand the importance of advance directives . Over the next 45 days, patient will complete advance directives and provide a copy to his health care provider.   Interventions: [name] Met with patient and his wife at the provider office to assist with the completion of advance directives . Educated the patient on what to do with advance directive document once the form is notarized  Patient Self Care Activities:  . Performs ADL's independently . Calls provider office for new concerns or questions  Plan:  . Patient will have advance directive notarized  Please see past updates related to this goal by clicking on the "Past Updates" button in the selected goal        Follow Up Plan: SW will follow up with patient by phone over the next two weeks   [name], BSW, CDP [facility] / [place] Worker [phone]  Total time spent performing care coordination and/or care management activities with the patient by phone or face to face = 45 minutes.

## 2018-08-09 NOTE — Chronic Care Management (AMB) (Addendum)
Chronic Care Management   Follow Up Note   08/09/2018 Name: [name] MRN: [id number] DOB: [birth year]  Referred by: [name], FNP Reason for referral : Chronic Care Management (CCM FACE TO FACE FOLLOW UP)   [name] is a 73 y.o. year old male who is a primary care patient of [name], [place]. The CCM team was consulted for assistance with chronic disease management and care coordination needs.    Review of patient status, including review of consultants reports, relevant laboratory and other test results, and collaboration with appropriate care team members and the patient's provider was performed as part of comprehensive patient evaluation and provision of chronic care management services.    The CCM Team met with the patient and his wife [name] in the office today.    Goals Addressed      Patient Stated   . "I don't like taking medications but  I will try to do better" (pt-stated)   On track    Current Barriers:   Knowledge deficit related to importance of taking medications exactly as prescribed  Impaired cognitive ability and memory loss related to Dementia   Nurse Case Manager Clinical Goal(s):  [name] Over the next 30 days, patient will demonstrate improved adherence to prescribed treatment plan for all health related issues as evidenced by patient/wife will report patient is taking all of his medications exactly as prescribed without missed doses.   Interventions:   Face to Face visit completed today with patient and wife [name] . Reviewed medications with patient/wife and discussed indications for use and importance of taking all medications exactly as prescribed  . Obtained phone # for [facility] from wife for provider to fax in new Rx for 90 days for Amlodipine (message re-sent to [name], FNP via in basket message) . Collaboration via in [employer] to [name], FNP re: changing patient's Namzaric to Exelon patch . Assessed and  confirmed patient is taking his Vitamin D as prescribed for low Vitamin D; education given (patient is taking as prescribed per wife) . Scheduled telephone CCM follow-up with wife [name]  Patient Self Care Activities:   Wife and patient verbalize understanding of today's education/instructions provided  . Patient is currently UNABLE TO independently self-administers medications (wife is administering) . Patient is now adhering to taking his medications as prescribed (wife states he cooperating better with taking meds)  Plan:  . RNCM will follow up with wife [name] by telephone in 2-3 weeks   Please see past updates related to this goal by clicking on the "Past Updates" button in the selected goal     . COMPLETED: "I don't think I have high blood pressure" (pt-stated)       Current Barriers:  [name] Knowledge deficit related to disease process and self-health management . Impaired cognitive ability and memory loss related to Dementia  Nurse Case Manager Clinical Goal(s):  [name] Over the next 30 days, patient will demonstrate improved adherence to prescribed treatment plan for Hypertension as evidenced bypatient/wife will report patient is taking his Amlodipine exactly as prescribed w/o missed doses  . Patient's wife will report monitoring patient's BP at least 2-3 times weekly and will record his readings  Interventions:   Face to Face CCM follow up visit completed with patient and wife [name]  Assessed for patients adherence to taking his prescribed BP medications (wife states patient is now adhering, she reports no missed doses)  Assessed patient's BP while in office; resting BP 130/80  Confirmed wife received the Hypertensive booklet mailed to her residence (wife confirmed receiving the booklet)  Assessed for questions or concerns related to patient Hypertension   Confirmed wife has RNCM contact number   Scheduled a follow up call with patient/wife  Patient Self Care Activities:    Wife and patient verbalize understanding of today's education/instructions provided  . Patient is currently UNABLE TO independently self-administers medications (wife administers)  Plan:  . Goal Met   *initial goal documentation      Other   . "He needs to have his eyes checked and needs to see the dentist"   Not on track    Current Barriers:  [name] Knowledge Deficit related to appropriate providers for Ophthalmology and Dental exams . Impaired cognitive ability and memory related to Dementia   Nurse Case Manager Clinical Goal(s):  [name] Over the next 30 days, patient will attend all scheduled medical appointments: patient will have new patient appointments scheduled with Ophthalmoloy and Dentist  Interventions:   Face to Face visit completed with patient and wife [name] with provider [name], FNP via in basket, requesting referrals for Ophthalmology and Dentist (resent in basket message to provider, requesting referrals)  Provided RNCM contact number and 24/7 nurse advise line phone number; wife will call if needed  Telephone CCM follow-up scheduled with wife [name]   Patient Self Care Activities:   Verbalizes understanding of today's information/instructions . Currently UNABLE TO independently schedule and attend MD appointments independently (wife is providing transportation)  Plan:  . RNCM will follow up with wife [name] by phone in 1-2 weeks   Please see past updates related to this goal by clicking on the "Past Updates" button in the selected goal    NEW Goal: Wife stated, "He doesn't get up and move around or talk to me"  Current Barriers:   Impaired cognitive ability and memory loss related to Dementia  Lack of physical mobility and or exercise secondary to Dementia  Nurse Case Manager Clinical Goal(s):  [name] Over the next 30 days, patient will work with in home PT and ST  to address needs related to impaired physical mobility and  cognitive/memory issues  Interventions:   Assessed for patient's likes/dislikes related to physical activity, games, card playing, etc.   Assessed for patient/wifes consideration of patient participating in an outpatient setting offering socialization and physical activity like an adult daycare and or [place] (patient/wife decline)  Assessed for consideration for wife/patient to have an in home PT and ST evaluation for services to work with patient on gait, strengthening, stamina and recommendations for a HEP as well as ST to work on and teach wife/patient exercises to help improve cognitive/memory function (patient and wife agreed) . Collaboration with [name], FNP via in basket message requesting a referral to [facility] for in home PT and ST evaluation  . Scheduled a telephone follow up call with wife  Patient Self Care Activities:   Wife and patient verbalize understanding of today's education/instructions provided  . Patient is currently UNABLE TO independently self-administers medications (wife is administering) . Patient is currently NOT engaging in any activities and or socialization  Initial goal documentation  The CM team will reach out to the patient again over the next 2-3 weeks.    Barb Merino, RN,CCM Care Management Coordinator [facility]  Direct Phone: [phone]

## 2018-08-09 NOTE — Patient Instructions (Signed)
Social Worker Visit Information  Goals we discussed today:  Goals Addressed            This Visit's Progress     Patient Stated   . "I need a back up transportation plan" (pt-stated)   On track    Current Barriers:  [name] Memory Deficits - the patient has dementia and does not drive . Limited social support - the patient relies on his wife to provide transportation. The patients wife is battling cancer and concerned the patient may need alternate transportation resources during her treatment.  Clinical Social Work Clinical Goal(s):  [name] Over the next 30  days, client will work with SW to address concerns related to transportation barriers  Interventions: . Interviewed patient and assisted with completion of SCAT application  Patient Self Care Activities:  . Performs ADL's independently . Currently unable to independently drive self. Patient relies on wife for transportation needs who is currently diagnosed with cancer    Plan:  . Social Dentist the patient in the next two weeks to assist with arranging a SCAT eligibility interview   Please see past updates related to this goal by clicking on the "Past Updates" button in the selected goal      . "I need housing repairs" (pt-stated)   Not on track    Current Barriers:  . Financial constraints . Memory Deficits  Clinical Social Work Clinical Goal(s):  [name] Over the next 30 days, client will work with SW to address concerns related to water leaks and HVAC repairs.   Interventions: . Patient interviewed and appropriate assessments performed . Provided patient with information about available community resources to assist with repairs  . Social worker informed patient the referral to [employer] was rejected due to property value being greater than 150,000 . Social Worker obtained patient monthly income and discussed options to look at other resources that may assist  Patient Self Care Activities:   . Performs ADL's independently  Plan:  . Social Worker will review other community resource options and refer patient to appropriate agencies to assist with home modifications   Please see past updates related to this goal by clicking on the "Past Updates" button in the selected goal      . COMPLETED: "I need to eat better meals" (pt-stated)       Current Barriers:  [name] Memory Deficits - difficulty remembering to eat  . Limited social support - The patients spouse is undergoing cancer treatment and has days she does not feel well enough to prepare meals. No other local supportive family and/or friends . Limitation with iADL's - reported to "snack" rather than eating nutritious meals on days patients spouse is unable to prepare meals  Clinical Social Work Clinical Goal(s):  [name] Over the next 10 days, client will work with SW to address concerns related to nutrition  Interventions: . Successfully placed the patient on the wait list to receive mobile meals . Educated the patient on the mobile meals program and how the waiting list works  Patient Self Care Activities:  . Performs ADL's independently . Calls provider office for new concerns or questions . Currently unable to independently  prepare own meals  Plan:  . Patient will continue to receive assistance from spouse to support with nutrition   Please see past updates related to this goal by clicking on the "Past Updates" button in the selected goal      . "I want to name a power  of attorney" (pt-stated)   On track    Current Barriers:  . Limited education about Advance Directives  Clinical Social Work Clinical Goal(s):  [name] Over the next 10 days, patient will work with SW to understand the importance of advance directives . Over the next 45 days, patient will complete advance directives and provide a copy to his health care provider.   Interventions: [name] Met with patient and his wife at the provider office to assist with the  completion of advance directives . Educated the patient on what to do with advance directive document once the form is notarized  Patient Self Care Activities:  . Performs ADL's independently . Calls provider office for new concerns or questions  Plan:  . Patient will have advance directive notarized  Please see past updates related to this goal by clicking on the "Past Updates" button in the selected goal          Materials Provided: Verbal education about notarizing advance directive provided face to face  Follow Up Plan: SW will follow up with patient by phone over the next two weeks   [name], BSW, CDP [facility] [place] [facility] Social Worker [phone]

## 2018-08-09 NOTE — Patient Instructions (Signed)
[name] , Thank you for taking time to come for your Medicare Wellness Visit. I appreciate your ongoing commitment to your health goals. Please review the following plan we discussed and let me know if I can assist you in the future.   Screening recommendations/referrals: Colonoscopy: 01/2018 Recommended yearly ophthalmology/optometry visit for glaucoma screening and checkup Recommended yearly dental visit for hygiene and checkup  Vaccinations: Influenza vaccine: declined Pneumococcal vaccine: declined Tdap vaccine: 12/2013 Shingles vaccine: declined    Advanced directives: Please bring a copy of your POA (Power of Attorney) and/or Living Will to your next appointment.    Conditions/risks identified: Memory  Next appointment:  Preventive Care 46 Years and Older, Male Preventive care refers to lifestyle choices and visits with your health care provider that can promote health and wellness. What does preventive care include?  A yearly physical exam. This is also called an annual well check.  Dental exams once or twice a year.  Routine eye exams. Ask your health care provider how often you should have your eyes checked.  Personal lifestyle choices, including:  Daily care of your teeth and gums.  Regular physical activity.  Eating a healthy diet.  Avoiding tobacco and drug use.  Limiting alcohol use.  Practicing safe sex.  Taking low doses of aspirin every day.  Taking vitamin and mineral supplements as recommended by your health care provider. What happens during an annual well check? The services and screenings done by your health care provider during your annual well check will depend on your age, overall health, lifestyle risk factors, and family history of disease. Counseling  Your health care provider may ask you questions about your:  Alcohol use.  Tobacco use.  Drug use.  Emotional well-being.  Home and relationship well-being.  Sexual activity.   Eating habits.  History of falls.  Memory and ability to understand (cognition).  Work and work Statistician. Screening  You may have the following tests or measurements:  Height, weight, and BMI.  Blood pressure.  Lipid and cholesterol levels. These may be checked every 5 years, or more frequently if you are over 30 years old.  Skin check.  Lung cancer screening. You may have this screening every year starting at age 78 if you have a 30-pack-year history of smoking and currently smoke or have quit within the past 15 years.  Fecal occult blood test (FOBT) of the stool. You may have this test every year starting at age 45.  Flexible sigmoidoscopy or colonoscopy. You may have a sigmoidoscopy every 5 years or a colonoscopy every 10 years starting at age 101.  Prostate cancer screening. Recommendations will vary depending on your family history and other risks.  Hepatitis C blood test.  Hepatitis B blood test.  Sexually transmitted disease (STD) testing.  Diabetes screening. This is done by checking your blood sugar (glucose) after you have not eaten for a while (fasting). You may have this done every 1-3 years.  Abdominal aortic aneurysm (AAA) screening. You may need this if you are a current or former smoker.  Osteoporosis. You may be screened starting at age 19 if you are at high risk. Talk with your health care provider about your test results, treatment options, and if necessary, the need for more tests. Vaccines  Your health care provider may recommend certain vaccines, such as:  Influenza vaccine. This is recommended every year.  Tetanus, diphtheria, and acellular pertussis (Tdap, Td) vaccine. You may need a Td booster every 10 years.  Zoster vaccine. You may need this after age 81.  Pneumococcal 13-valent conjugate (PCV13) vaccine. One dose is recommended after age 36.  Pneumococcal polysaccharide (PPSV23) vaccine. One dose is recommended after age 31. Talk to your  health care provider about which screenings and vaccines you need and how often you need them. This information is not intended to replace advice given to you by your health care provider. Make sure you discuss any questions you have with your health care provider. Document Released: 06/12/2015 Document Revised: 02/03/2016 Document Reviewed: 03/17/2015 Elsevier Interactive Patient Education  2017 [employer] Prevention in the Home Falls can cause injuries. They can happen to people of all ages. There are many things you can do to make your home safe and to help prevent falls. What can I do on the outside of my home?  Regularly fix the edges of walkways and driveways and fix any cracks.  Remove anything that might make you trip as you walk through a door, such as a raised step or threshold.  Trim any bushes or trees on the path to your home.  Use bright outdoor lighting.  Clear any walking paths of anything that might make someone trip, such as rocks or tools.  Regularly check to see if handrails are loose or broken. Make sure that both sides of any steps have handrails.  Any raised decks and porches should have guardrails on the edges.  Have any leaves, snow, or ice cleared regularly.  Use sand or salt on walking paths during winter.  Clean up any spills in your garage right away. This includes oil or grease spills. What can I do in the bathroom?  Use night lights.  Install grab bars by the toilet and in the tub and shower. Do not use towel bars as grab bars.  Use non-skid mats or decals in the tub or shower.  If you need to sit down in the shower, use a plastic, non-slip stool.  Keep the floor dry. Clean up any water that spills on the floor as soon as it happens.  Remove soap buildup in the tub or shower regularly.  Attach bath mats securely with double-sided non-slip rug tape.  Do not have throw rugs and other things on the floor that can make you trip. What  can I do in the bedroom?  Use night lights.  Make sure that you have a light by your bed that is easy to reach.  Do not use any sheets or blankets that are too big for your bed. They should not hang down onto the floor.  Have a firm chair that has side arms. You can use this for support while you get dressed.  Do not have throw rugs and other things on the floor that can make you trip. What can I do in the kitchen?  Clean up any spills right away.  Avoid walking on wet floors.  Keep items that you use a lot in easy-to-reach places.  If you need to reach something above you, use a strong step stool that has a grab bar.  Keep electrical cords out of the way.  Do not use floor polish or wax that makes floors slippery. If you must use wax, use non-skid floor wax.  Do not have throw rugs and other things on the floor that can make you trip. What can I do with my stairs?  Do not leave any items on the stairs.  Make sure that there are  handrails on both sides of the stairs and use them. Fix handrails that are broken or loose. Make sure that handrails are as long as the stairways.  Check any carpeting to make sure that it is firmly attached to the stairs. Fix any carpet that is loose or worn.  Avoid having throw rugs at the top or bottom of the stairs. If you do have throw rugs, attach them to the floor with carpet tape.  Make sure that you have a light switch at the top of the stairs and the bottom of the stairs. If you do not have them, ask someone to add them for you. What else can I do to help prevent falls?  Wear shoes that:  Do not have high heels.  Have rubber bottoms.  Are comfortable and fit you well.  Are closed at the toe. Do not wear sandals.  If you use a stepladder:  Make sure that it is fully opened. Do not climb a closed stepladder.  Make sure that both sides of the stepladder are locked into place.  Ask someone to hold it for you, if possible.   Clearly mark and make sure that you can see:  Any grab bars or handrails.  First and last steps.  Where the edge of each step is.  Use tools that help you move around (mobility aids) if they are needed. These include:  Canes.  Walkers.  Scooters.  Crutches.  Turn on the lights when you go into a dark area. Replace any light bulbs as soon as they burn out.  Set up your furniture so you have a clear path. Avoid moving your furniture around.  If any of your floors are uneven, fix them.  If there are any pets around you, be aware of where they are.  Review your medicines with your doctor. Some medicines can make you feel dizzy. This can increase your chance of falling. Ask your doctor what other things that you can do to help prevent falls. This information is not intended to replace advice given to you by your health care provider. Make sure you discuss any questions you have with your health care provider. Document Released: 03/12/2009 Document Revised: 10/22/2015 Document Reviewed: 06/20/2014 Elsevier Interactive Patient Education  2017 [employer].

## 2018-08-10 NOTE — Patient Instructions (Addendum)
Visit Information  Goals Addressed      Patient Stated   . "I don't like taking medications but  I will try to do better" (pt-stated)   On track    Current Barriers:   Knowledge deficit related to importance of taking medications exactly as prescribed  Impaired cognitive ability and memory loss related to Dementia   Nurse Case Manager Clinical Goal(s):  [name] Over the next 30 days, patient will demonstrate improved adherence to prescribed treatment plan for all health related issues as evidenced by patient/wife will report patient is taking all of his medications exactly as prescribed without missed doses.   Interventions:   Face to Face visit completed today with patient and wife [name] . Reviewed medications with patient/wife and discussed indications for use and importance of taking all medications exactly as prescribed  . Obtained phone # for [facility] from wife for provider to fax in new Rx for 90 days for Amlodipine (message re-sent to [name], FNP via in basket message) . Collaboration via in [employer] to [name], FNP re: changing patient's Namzaric to Exelon patch . Assessed and confirmed patient is taking his Vitamin D as prescribed for low Vitamin D; education given (patient is taking as prescribed per wife) . Scheduled telephone CCM follow-up with wife [name]  Patient Self Care Activities:   Wife and patient verbalize understanding of today's education/instructions provided  . Patient is currently UNABLE TO independently self-administers medications (wife is administering) . Patient is now adhering to taking his medications as prescribed (wife states he cooperating better with taking meds)  Plan:  . RNCM will follow up with wife [name] by telephone in 2-3 weeks   Please see past updates related to this goal by clicking on the "Past Updates" button in the selected goal     . COMPLETED: "I don't think I have high blood pressure" (pt-stated)        Current Barriers:  [name] Knowledge deficit related to disease process and self-health management . Impaired cognitive ability and memory loss related to Dementia  Nurse Case Manager Clinical Goal(s):  [name] Over the next 30 days, patient will demonstrate improved adherence to prescribed treatment plan for Hypertension as evidenced bypatient/wife will report patient is taking his Amlodipine exactly as prescribed w/o missed doses  . Patient's wife will report monitoring patient's BP at least 2-3 times weekly and will record his readings  Interventions:   Face to Face CCM follow up visit completed with patient and wife [name]  Assessed for patients adherence to taking his prescribed BP medications (wife states patient is now adhering, she reports no missed doses)  Assessed patient's BP while in office; resting BP 130/80  Confirmed wife received the Hypertensive booklet mailed to her residence (wife confirmed receiving the booklet)  Assessed for questions or concerns related to patient Hypertension   Confirmed wife has RNCM contact number   Scheduled a follow up call with patient/wife  Patient Self Care Activities:   Wife and patient verbalize understanding of today's education/instructions provided  . Patient is currently UNABLE TO independently self-administers medications (wife administers)  Plan:  . Goal Met   *initial goal documentation      Other   . "He needs to have his eyes checked and needs to see the dentist"   Not on track    Current Barriers:  [name] Knowledge Deficit related to appropriate providers for Ophthalmology and Dental exams . Impaired cognitive ability and memory related to Dementia  Nurse Case Manager Clinical Goal(s):  [name] Over the next 30 days, patient will attend all scheduled medical appointments: patient will have new patient appointments scheduled with Ophthalmoloy and Dentist  Interventions:   Face to Face visit completed with patient and wife [name] with provider [name], FNP via in basket, requesting referrals for Ophthalmology and Dentist (resent in basket message to provider, requesting referrals)  Provided RNCM contact number and 24/7 nurse advise line phone number; wife will call if needed  Telephone CCM follow-up scheduled with wife [name]   Patient Self Care Activities:   Verbalizes understanding of today's information/instructions . Currently UNABLE TO independently schedule and attend MD appointments independently (wife is providing transportation)  Plan:  . RNCM will follow up with wife [name] by phone in 1-2 weeks   Please see past updates related to this goal by clicking on the "Past Updates" button in the selected goal    NEW Goal: Wife stated, "He doesn't get up and move around or talk to me"  Current Barriers:   Impaired cognitive ability and memory loss related to Dementia  Lack of physical mobility and or exercise secondary to Dementia  Nurse Case Manager Clinical Goal(s):  [name] Over the next 30 days, patient will work with in home PT and ST  to address needs related to impaired physical mobility and cognitive/memory issues  Interventions:   Assessed for patient's likes/dislikes related to physical activity, games, card playing, etc.   Assessed for patient/wifes consideration of patient participating in an outpatient setting offering socialization and physical activity like an adult daycare and or [place] (patient/wife decline)  Assessed for consideration for wife/patient to have an in home PT and ST evaluation for services to work with patient on gait, strengthening, stamina and recommendations for a HEP as well as ST to work on and teach wife/patient exercises to help improve cognitive/memory function (patient and wife agreed) . Collaboration with [name], FNP via in basket message requesting a referral to [facility] for in home PT and ST evaluation  . Scheduled a  telephone follow up call with wife  Patient Self Care Activities:   Wife and patient verbalize understanding of today's education/instructions provided  . Patient is currently UNABLE TO independently self-administers medications (wife is administering) . Patient is currently NOT engaging in any activities and or socialization  Initial goal documentation     The patient verbalized understanding of instructions provided today and declined a print copy of patient instruction materials.   The CM team will reach out to the patient again over the next 2-3 weeks.   Barb Merino, RN,CCM Care Management Coordinator [facility]  Direct Phone: [phone]

## 2018-08-15 ENCOUNTER — Ambulatory Visit: Payer: Self-pay

## 2018-08-15 DIAGNOSIS — F0391 Unspecified dementia with behavioral disturbance: Secondary | ICD-10-CM

## 2018-08-15 DIAGNOSIS — E559 Vitamin D deficiency, unspecified: Secondary | ICD-10-CM

## 2018-08-15 DIAGNOSIS — I10 Essential (primary) hypertension: Secondary | ICD-10-CM

## 2018-08-15 NOTE — Patient Instructions (Signed)
Social Worker Visit Information  Goals we discussed today:  Goals Addressed            This Visit's Progress     Patient Stated   . "I want to know more about my military benefits" (pt-stated)       Current Barriers:  [name] Memory Deficits . Limited education about VA benefits   Clinical Social Work Clinical Goal(s):  [name] Over the next 30 days, client will work with SW to address concerns related to insurance benefits   Interventions: . SW outreached [place] to obtain information on patients benefit status  . SW contacted the patients spouse, Jamyron Redd to provide updated information regarding the steps to take to inquire the patients VA benefit status.   Patient Self Care Activities:  . Performs ADL's independently . Calls provider office for new concerns or questions  Plan:  . Ms. Terral will contact this SW "tomorrow" due to just returning home from the grocery store and feeling tired  Please see past updates related to this goal by clicking on the "Past Updates" button in the selected goal         Handwashing is one of the best ways to protect yourself and your family from getting sick. Wash Your Hands Often to Stay Healthy  When: You can help yourself and your loved ones stay healthy by washing your hands often, especially during these key times when you are likely to get and spread germs: . Before, during, and after preparing food  . Before eating food  . Before and after caring for someone at home who is sick with vomiting or diarrhea  . Before and after treating a cut or wound  . After using the toilet  . After being in public spaces . After changing diapers or cleaning up a child who has used the toilet  . After blowing your nose, coughing, or sneezing  . After touching an animal, animal feed, or animal waste  . After handling pet food or pet treats  . After touching garbage . After touching public doors, telephones, shopping carts, or other  surfaces .  Five Steps to [place] the Right Way 1. Wet your hands with clean, running water (warm or cold), turn off the tap, and apply soap. 2. Lather your hands by rubbing them together with the soap. Lather the backs of your hands, between your fingers, and under your nails. 3. Scrub your hands for at least 20 seconds. Need a timer? Hum the "Happy Birthday" song from beginning to end twice. 4. Rinse your hands well under clean, running water. 5. Dry your hands using a clean towel or air dry them.  Source: [employer].is   Materials Provided: Verbal education about hand washing provided by phone  Follow Up Plan: Appointment scheduled for SW follow up with client by phone on: 3/19  [name], [place], CDP [facility] / [place] Worker [phone]

## 2018-08-15 NOTE — Chronic Care Management (AMB) (Signed)
  Chronic Care Management    Clinical Social Work Follow Up Note  08/15/2018 Name: [name] MRN: [id number] DOB: 05-29-1946  [name] is a 73 y.o. year old male who is a primary care patient of [name], [name]. The CCM team was consulted for assistance with chronic disease management and care coordination of community resource needs.  Review of patient status, including review of consultants reports, other relevant assessments, and collaboration with appropriate care team members and the patient's provider was performed as part of comprehensive patient evaluation and provision of chronic care management services.     I outreached the [employer] to inquire patients benefit status. I then outreached the patient by phone today to speak with [name] regarding VA benefits. Unfortunately, [name] reports this is not a good time to talk as she is just returning home from the grocery store and is feeling tired. Appointment scheduled for return call on 3/19.   Prior to ending today's call SW briefly discussed the importance of handwashing when returning home from outings such as the grocery store. [name] reports "I know, I am being very careful".   Goals Addressed            This Visit's Progress     Patient Stated   . "I want to know more about my military benefits" (pt-stated)       Current Barriers:  [name] Kitchen Memory Deficits . Limited education about VA benefits   Clinical Social Work Clinical Goal(s):  [name] Kitchen Over the next 30 days, client will work with SW to address concerns related to insurance benefits   Interventions: . SW outreached [place] to obtain information on patients benefit status  . SW contacted the patients spouse, [name] to provide updated information regarding the steps to take to inquire the patients VA benefit status.   Patient Self Care Activities:  . Performs ADL's independently . Calls provider office for new  concerns or questions  Plan:  . Ms. Warren will contact this SW "tomorrow" due to just returning home from the grocery store and feeling tired  Please see past updates related to this goal by clicking on the "Past Updates" button in the selected goal          Follow Up Plan: Appointment scheduled for SW follow up with client by phone on: 3/19   [name], BSW, CDP [facility] / [place] Worker [phone]  Total time spent performing care coordination and/or care management activities with the patient by phone or face to face = 20 minutes.

## 2018-08-16 ENCOUNTER — Ambulatory Visit: Payer: Self-pay

## 2018-08-16 ENCOUNTER — Telehealth: Payer: Self-pay

## 2018-08-16 NOTE — Chronic Care Management (AMB) (Signed)
  Chronic Care Management   Social Work Note  08/16/2018 Name: LENNIN OSMOND MRN: [id number] DOB: 17-Mar-1946  Unsuccessful outreach to the patient's spouse in response to [employer]. SW left a HIPAA compliant voice message requesting a return call.  Follow Up Plan: SW will follow up with patient by phone over the next week.  [name], BSW, CDP [facility] / [facility] Care Management Social Worker [phone]  Total time spent performing care coordination and/or care management activities with the patient by phone or face to face = 5 minutes.

## 2018-08-17 ENCOUNTER — Ambulatory Visit: Payer: Self-pay

## 2018-08-17 DIAGNOSIS — F0391 Unspecified dementia with behavioral disturbance: Secondary | ICD-10-CM

## 2018-08-17 DIAGNOSIS — I10 Essential (primary) hypertension: Secondary | ICD-10-CM

## 2018-08-17 DIAGNOSIS — E559 Vitamin D deficiency, unspecified: Secondary | ICD-10-CM

## 2018-08-17 NOTE — Chronic Care Management (AMB) (Signed)
  Chronic Care Management    Clinical Social Work Follow Up Note  08/17/2018 Name: RENDON HOWELL MRN: [id number] DOB: Jan 27, 1946  ODEAN FESTER is a 73 y.o. year old male who is a primary care patient of [name], [place]. The CCM team was consulted for assistance with [employer].   Review of patient status, including review of consultants reports, other relevant assessments, and collaboration with appropriate care team members and the patient's provider was performed as part of comprehensive patient evaluation and provision of chronic care management services.     I placed an outreach call to the patient on today's date to follow up on [place] benefit questions. I spoke with the patients spouse, Odell Choung, who the patient has verbally authorized this writer to speak with. Mrs. Cordell reports feelings of anxiousness surrounding the Covid-19 pandemic. It is reported the patient and Mrs. Chaves have a utility bill of $165 due this week; if paid the family will not have any money to purchase food. SW educated Mrs. Palen on protocols set forth by [employer] regarding lack of payment and no disconnections at this time. Mrs. Bender was able to [employer] to discuss concerns and received a later payment date.  SW provided education to Mrs. Foresta regarding Covid-19 and precautions such as staying 6 feet from others, washing hands frequently, and not touching her face. Mrs. Hartlage is able to speak back all recommendations provided. SW encouraged Mrs. Withington to limit her time in the public. Mrs Russ stated understanding.   Goals Addressed            This Visit's Progress     Patient Stated   . "I want to know more about my military benefits" (pt-stated)       Current Barriers:  [name] Memory Deficits . Limited education about VA benefits   Clinical Social Work Clinical Goal(s):  [name] Over the next 30 days, client will work with SW to address concerns related to insurance benefits    Interventions: . SW outreached [place] to obtain information on patients benefit status  . SW contacted the patients spouse, Shriyans Kuenzi to provide updated information regarding the steps to take to inquire the patients VA benefit status.  . SW educated Mrs. Vandervliet the patient would have to call the [place] to inquire as this SW does not have written permission to discuss VA benefits . Mrs. Alderman reports the patient has tried [employer] in the past and was informed he does not have dental or vision benefits . SW discussed looking at other options to assist with patient needs.  Patient Self Care Activities:  . Performs ADL's independently . Calls provider office for new concerns or questions   Please see past updates related to this goal by clicking on the "Past Updates" button in the selected goal          Follow Up Plan:  SW was unable to address many goals during today's call due to anxiousness surrounding Covid-19 and utility cost. SW encouraged Mrs. Boeckman to outreach this SW if needed prior to the next scheduled call. SW will outreach the patient and Mrs Purves in the next two weeks.  [name], BSW, CDP [facility] / [facility] Care Management Social Worker [phone]  Total time spent performing care coordination and/or care management activities with the patient by phone or face to face = 35 minutes.

## 2018-08-17 NOTE — Patient Instructions (Signed)
Social Worker Visit Information  Goals we discussed today:  Goals Addressed            This Visit's Progress     Patient Stated   . "I want to know more about my military benefits" (pt-stated)       Current Barriers:  [name] Memory Deficits . Limited education about VA benefits   Clinical Social Work Clinical Goal(s):  [name] Over the next 30 days, client will work with SW to address concerns related to insurance benefits   Interventions: . SW outreached [place] to obtain information on patients benefit status  . SW contacted the patients spouse, Shreyas Piatkowski to provide updated information regarding the steps to take to inquire the patients VA benefit status.  . SW educated Mrs. Isham the patient would have to call the [place] to inquire as this SW does not have written permission to discuss VA benefits . Mrs. Burzynski reports the patient has tried [employer] in the past and was informed he does not have dental or vision benefits . SW discussed looking at other options to assist with patient needs.  Patient Self Care Activities:  . Performs ADL's independently . Calls provider office for new concerns or questions   Please see past updates related to this goal by clicking on the "Past Updates" button in the selected goal         Handwashing is one of the best ways to protect yourself and your family from getting sick. Wash Your Hands Often to Stay Healthy  When: You can help yourself and your loved ones stay healthy by washing your hands often, especially during these key times when you are likely to get and spread germs: . Before, during, and after preparing food  . Before eating food  . Before and after caring for someone at home who is sick with vomiting or diarrhea  . Before and after treating a cut or wound  . After using the toilet  . After being in public spaces . After changing diapers or cleaning up a child who has used the toilet  . After blowing your  nose, coughing, or sneezing  . After touching an animal, animal feed, or animal waste  . After handling pet food or pet treats  . After touching garbage . After touching public doors, telephones, shopping carts, or other surfaces .  Five Steps to [place] the Right Way 1. Wet your hands with clean, running water (warm or cold), turn off the tap, and apply soap. 2. Lather your hands by rubbing them together with the soap. Lather the backs of your hands, between your fingers, and under your nails. 3. Scrub your hands for at least 20 seconds. Need a timer? Hum the "Happy Birthday" song from beginning to end twice. 4. Rinse your hands well under clean, running water. 5. Dry your hands using a clean towel or air dry them.  Source: [employer].is   Materials Provided: Verbal education about hand washing provided by phone  Follow Up Plan: SW will follow up with patient by phone over the next two weeks.   [name], BSW, CDP [facility] / [facility] Care Management Social Worker [phone]

## 2018-08-23 ENCOUNTER — Other Ambulatory Visit: Payer: Self-pay

## 2018-08-23 ENCOUNTER — Telehealth: Payer: Self-pay

## 2018-08-23 MED ORDER — MEMANTINE HCL-DONEPEZIL HCL ER 28-10 MG PO CP24: 1.0000 | ORAL_CAPSULE | Freq: Every day | ORAL | 0 refills | Status: DC

## 2018-08-24 ENCOUNTER — Other Ambulatory Visit: Payer: Self-pay | Admitting: Nurse Practitioner

## 2018-08-24 DIAGNOSIS — I10 Essential (primary) hypertension: Secondary | ICD-10-CM

## 2018-08-24 MED ORDER — AMLODIPINE BESYLATE 5 MG PO TABS: 5.0000 mg | ORAL_TABLET | Freq: Every day | ORAL | 1 refills | Status: DC

## 2018-08-27 ENCOUNTER — Other Ambulatory Visit: Payer: Self-pay

## 2018-08-27 ENCOUNTER — Ambulatory Visit: Payer: Self-pay

## 2018-08-27 ENCOUNTER — Other Ambulatory Visit: Payer: Self-pay | Admitting: Nurse Practitioner

## 2018-08-27 ENCOUNTER — Telehealth: Payer: Medicare Other

## 2018-08-27 DIAGNOSIS — I10 Essential (primary) hypertension: Secondary | ICD-10-CM

## 2018-08-27 DIAGNOSIS — E559 Vitamin D deficiency, unspecified: Secondary | ICD-10-CM

## 2018-08-27 DIAGNOSIS — F0391 Unspecified dementia with behavioral disturbance: Secondary | ICD-10-CM

## 2018-08-27 MED ORDER — MEMANTINE HCL-DONEPEZIL HCL ER 28-10 MG PO CP24: 1.0000 | ORAL_CAPSULE | Freq: Every day | ORAL | 0 refills | Status: DC

## 2018-08-27 MED ORDER — ESCITALOPRAM OXALATE 5 MG PO TABS: 5.0000 mg | ORAL_TABLET | Freq: Every day | ORAL | 0 refills | Status: DC

## 2018-08-27 MED ORDER — PRAVASTATIN SODIUM 20 MG PO TABS: 20.0000 mg | ORAL_TABLET | Freq: Every day | ORAL | 1 refills | Status: DC

## 2018-08-27 NOTE — Chronic Care Management (AMB) (Signed)
Chronic Care Management    Clinical Social Work Follow Up Note  08/27/2018 Name: CAYCE QUEZADA MRN: [id number] DOB: December 22, 1945  DELONTA YOHANNES is a 73 y.o. year old male who is a primary care patient of [name], [place]. The CCM team was consulted for assistance with care coordination.  Review of patient status, including review of consultants reports, other relevant assessments, and collaboration with appropriate care team members and the patient's provider was performed as part of comprehensive patient evaluation and provision of chronic care management services.     I received an incomming call from the patients spouse, Deion Swift, who the patient has given verbal permission for this SW to speak with. Patient  HIPAA identifiers confirmed.  Goals Addressed            This Visit's Progress     Patient Stated    "I need a back up transportation plan" (pt-stated)       Current Barriers:   Memory Deficits - the patient has dementia and does not drive  Limited social support - the patient relies on his wife to provide transportation. The patients wife is battling cancer and concerned the patient may need alternate transportation resources during her treatment.  Clinical Social Work Clinical Goal(s):   Over the next 30  days, client will work with SW to address concerns related to transportation barriers  Interventions:  [place] with [employer] to confirm receipt of application. Voice message left requesting a return call.  Patient Self Care Activities:   Performs ADL's independently  Currently unable to independently drive self. Patient relies on wife for transportation needs who is currently diagnosed with cancer    Please see past updates related to this goal by clicking on the "Past Updates" button in the selected goal       "I need housing repairs" (pt-stated)   Not on track    Current Barriers:   Financial constraints  Memory  Deficits  Clinical Social Work Clinical Goal(s):   Over the next 30 days, client will work with SW to address concerns related to water leaks and HVAC repairs.   Interventions:  Educated the patients spouse and caregiver on income requirements for housing repair resources  Discussed barriers to locating available resources as the reported monthly income is well over the poverty threshold which most agencies follow  Collaborated with CCM nurse case manager to discuss concerns the patients spouse reports involving resource needs  Patient Self Care Activities:   Performs ADL's independently   Please see past updates related to this goal by clicking on the "Past Updates" button in the selected goal       COMPLETED: "I want to be more active" (pt-stated)       Current Barriers:   Social Isolation  Memory Deficits  Clinical Social Work Clinical Goal(s):   Over the next 10 days, client will work with SW to address concerns related to social isolation   Over the next 30 days, patient will engage in a social activity  Interventions:  Discussed the importance of social isolation at this time which causes barriers to the patient and his caregiver in accessing local senior centers as originally planned upon  Encouraged the patient and his spouse to engage in physical activities in the home such as walking   Provided in home cognitive exercise ideas to the patient and his spouse such as dominos or cards  Encouraged the patients spouse/caregiver to provide materials such as nuts  and bolts for the patient to tinker with  Patient Self Care Activities:   Performs ADL's independently  Calls provider office for new concerns or questions  Plan:   The patient will remain as active as possible at home while concerns of COVID-19 remain  Please see past updates related to this goal by clicking on the "Past Updates" button in the selected goal       "I want to know more about my  military benefits" (pt-stated)   Not on track    Current Barriers:   Memory Deficits  Limited education about VA benefits   Clinical Social Work Clinical Goal(s):   Over the next 30 days, client will work with SW to address concerns related to insurance benefits   Interventions:  SW encouraged Mrs. Levitt to outreach the [place] office to discuss patients needs as this SW is unable to obtain information   Patient Self Care Activities:   Performs ADL's independently  Calls provider office for new concerns or questions   Mrs. Lennox reports understanding of reason she needs to call on behalf of the patient   Please see past updates related to this goal by clicking on the "Past Updates" button in the selected goal       COMPLETED: "I want to name a power of attorney" (pt-stated)       Current Barriers:   Limited education about Advance Directives  Clinical Social Work Clinical Goal(s):   Over the next 10 days, patient will work with SW to understand the importance of advance directives  Over the next 45 days, patient will complete advance directives and provide a copy to his health care provider.   Interventions:  Discussed plan to have document notarized. At this time- due to concerns with COVID-19 the patient will await visiting a notary.  SW reviewed future places of business in which a notary can be accessed.   Patient Self Care Activities:   Performs ADL's independently  Calls provider office for new concerns or questions  Please see past updates related to this goal by clicking on the "Past Updates" button in the selected goal          Follow Up Plan: SW will follow up with patient by phone over the next 2-3 weeks.   [name], BSW, CDP [facility] Care Management Social Worker [phone]  Total time spent performing care coordination and/or care management activities with the patient by phone or face to face = 45 minutes.

## 2018-08-27 NOTE — Chronic Care Management (AMB) (Signed)
    Chronic Care Management   Outreach Note  08/27/2018 Name: [name] MRN: [id number] DOB: 12-21-1945  Referred by: [name], FNP Reason for referral : Chronic Care Management (TELEPHONE CCM FOLLOW UP )  I received a voice mail from [name] today, requesting a call back.  An unsuccessful telephone outreach was attempted today. The patient was referred to the case management team by [name] FNP for assistance with Essential hypertension, pre-diabetes, dementia with behavioral disturbance.   Follow Up Plan: The CM team will reach out to the patient again over the next 5-7 days.   Barb Merino, RN,CCM Care Management Coordinator [facility] [facility]  Direct Phone: [phone]

## 2018-08-28 NOTE — Chronic Care Management (AMB) (Signed)
Chronic Care Management   Follow Up Note   08/27/2018 Name: [name] MRN: [id number] DOB: Jan 09, 1946  Referred by: [name], FNP Reason for referral : Chronic Care Management (TELEPHONE CCM FOLLOW UP )   [name] is a 73 y.o. year old male who is a primary care patient of [name], [place]. The CCM team was consulted for assistance with chronic disease management and care coordination needs.    Review of patient status, including review of consultants reports, relevant laboratory and other test results, and collaboration with appropriate care team members and the patient's provider was performed as part of comprehensive patient evaluation and provision of chronic care management services.    I received a call back from [name] today.   Goals Addressed      Patient Stated   . COMPLETED: "I don't like taking medications but  I will try to do better" (pt-stated)       Current Barriers:   Knowledge deficit related to importance of taking medications exactly as prescribed  Impaired cognitive ability and memory loss related to Dementia  Nurse Case Manager Clinical Goal(s):  [name] Over the next 30 days, patient will demonstrate improved adherence to prescribed treatment plan for all health related issues as evidenced by patient/wife will report patient is taking all of his medications exactly as prescribed without missed doses.   Interventions:  . TELEPHONE CCM OUTREACH TO WIFE BARBARA FOR CCM FOLLOW UP   Assessed for patient adherence to taking his meds as prescribed (wife states patient is now cooperating with taking all meds exactly as prescribed)  Assessed for questions related to patient's medication administration (wife denies)  Patient Self Care Activities:   Wife verbalize understanding of today's education/instructions provided  . Patient is currently UNABLE TO independently self-administers medications (wife is administering) . Patient  is now adhering to taking his medications as prescribed (wife states he cooperating better with taking meds)  Plan:  . Goal met, no further follow up for this goal needed at this time   Please see past updates related to this goal by clicking on the "Past Updates" button in the selected goal       Wife states:   . "He doesn't get up and move around or talk to me"   Not on track    Current Barriers:   Impaired cognitive ability and memory loss related to Dementia  Lack of physical mobility and or exercise secondary to Dementia  Nurse Case Manager Clinical Goal(s):  [name] Over the next 30 days, patient will work with in home PT and ST  to address needs related to impaired physical mobility and cognitive/memory issues  Interventions:   TELEPHONE CCM OUTREACH TO WIFE BARBARA FOR CCM FOLLOW UP   Discussed with wife if in home PT/ST are still desired at present time or if she prefers to reconsider to start services after COVID-19 (wife would like to start services after COVID-19) . Scheduled a telephone follow up call with wife [name] in about 1-2 weeks   Patient Self Care Activities:   Wife verbalizes understanding of today's education/instructions provided  . Patient is currently UNABLE TO independently self-administers medications (wife is administering) . Patient is currently NOT engaging in any activities and or socialization   Please see past updates related to this goal by clicking on the "Past Updates" button in the selected goal     . "He is out of all his medications" (wife stated)  Current Barriers:  [name] Knowledge deficits related to knowing how to best navigate [facility] when medication refills are needed . Lack of caregiver support  Nurse Case Manager Clinical Goal(s):  [name] Over the next 14 days, patient will have received all of his medications refills.   Over the next 30 days, wife will verbalize increased knowledge related to process to request med refills  via [facility] mail order  Interventions:  . TELEPHONE CCM OUTREACH TO WIFE BARBARA FOR CCM FOLLOW UP  . Assessed for patient adherence to taking his meds as prescribed (wife states she did not receive his refills from [place], he is out of all meds) . Collaboration with provider [name], FNP to notify of need for refill of all patient meds via [facility]-advised wife reports pt is out of all meds (message received from [place] stating refills were sent) . Attempted call with wife to [facility], left wife's call back number via automated system for representative will call back to confirm refills were received . Instructed wife to call provider and or RNCM if further refills are needed . Instructed wife to advise [facility] patient is out of all meds and to ask if shipment can be expedited . Instructed wife to call [facility] to order refills once patients supply is low in the future if she doesn't receive notification from [facility] that automated refills have been sent . Instructed wife that if refills have to picked up from [facility], to ask pharmacist for assistance with cost if available (for example if a coupon or rebate is available)  Scheduled telephone CCM follow-up with wife [name]  Patient Self Care Activities:   Wife verbalize understanding of today's education/instructions provided  . Patient is currently UNABLE TO independently self-administers medications (wife is administering) . Patient is now adhering to taking his medications as prescribed (wife states he cooperating better with taking meds)  Plan:  . RNCM will follow up with wife [name] by telephone in  7-14 days  Initial goal documentation     . "He needs to have his eyes checked and needs to see the dentist" (wife stated)   Not on track    Current Barriers:  [name] Knowledge Deficit related to appropriate providers for Ophthalmology and Dental exams . Impaired cognitive  ability and memory related to Dementia   Nurse Case Manager Clinical Goal(s):  [name] Over the next 30 days, patient will attend all scheduled medical appointments: patient will have new patient appointments scheduled with Ophthalmoloy and Dentist  Interventions:   TELEPHONE CCM OUTREACH TO WIFE [place] UP   Provided wife with information related to Optometry and Dental services offered through the [place] at the [place] location  Discussed current health plan change from traditional Medicare to [facility]  Discussed new benefits for patient regarding dental and vision care  Instructed wife to locate the list of providers given by healthplan  Instructed wife to schedule patient new patient appointments with each of the Specialist needed as appointments are being scheduled as far out as June or July due to COVID-19  Educated wife on potential complication of abscess or blood infection from untreated tooth decay  Telephone CCM follow-up scheduled with wife [name]   Patient Self Care Activities:   Wife verbalizes understanding of today's education/instructions provided . Patient is currently UNABLE TO independently schedule and attend MD appointments independently (wife scheduling appts and is providing transportation)  Plan:  . RNCM will follow  up with wife [name] by phone in 7-14 days  Please see past updates related to this goal by clicking on the "Past Updates" button in the selected goal       The CM team will reach out to the patient again over the next 7-14 days.   Eugene Merino, RN,CCM Care Management Coordinator [facility] [facility]  Direct Phone: [phone]

## 2018-08-28 NOTE — Patient Instructions (Signed)
Social Worker Visit Information  Goals we discussed today:  Goals Addressed            This Visit's Progress     Patient Stated   . "I need a back up transportation plan" (pt-stated)       Current Barriers:  [name] Memory Deficits - the patient has dementia and does not drive . Limited social support - the patient relies on his wife to provide transportation. The patients wife is battling cancer and concerned the patient may need alternate transportation resources during her treatment.  Clinical Social Work Clinical Goal(s):  [name] Over the next 30  days, client will work with SW to address concerns related to transportation barriers  Interventions: . [place] with [employer] to confirm receipt of application. Voice message left requesting a return call.  Patient Self Care Activities:  . Performs ADL's independently . Currently unable to independently drive self. Patient relies on wife for transportation needs who is currently diagnosed with cancer    Please see past updates related to this goal by clicking on the "Past Updates" button in the selected goal      . "I need housing repairs" (pt-stated)   Not on track    Current Barriers:  . Financial constraints . Memory Deficits  Clinical Social Work Clinical Goal(s):  [name] Over the next 30 days, client will work with SW to address concerns related to water leaks and HVAC repairs.   Interventions: . Educated the patients spouse and caregiver on income requirements for housing repair resources . Discussed barriers to locating available resources as the reported monthly income is well over the poverty threshold which most agencies follow . Collaborated with CCM nurse case manager to discuss concerns the patients spouse reports involving resource needs  Patient Self Care Activities:  . Performs ADL's independently   Please see past updates related to this goal by clicking on the "Past Updates" button in the selected goal       . COMPLETED: "I want to be more active" (pt-stated)       Current Barriers:  . Social Isolation . Memory Deficits  Clinical Social Work Clinical Goal(s):  [name] Over the next 10 days, client will work with SW to address concerns related to social isolation  . Over the next 30 days, patient will engage in a social activity  Interventions:  Discussed the importance of social isolation at this time which causes barriers to the patient and his caregiver in accessing local senior centers as originally planned upon  Encouraged the patient and his spouse to engage in physical activities in the home such as walking  . Provided in home cognitive exercise ideas to the patient and his spouse such as dominos or cards . Encouraged the patients spouse/caregiver to provide materials such as nuts and bolts for the patient to tinker with  Patient Self Care Activities:  . Performs ADL's independently . Calls provider office for new concerns or questions  Plan:  . The patient will remain as active as possible at home while concerns of COVID-19 remain  Please see past updates related to this goal by clicking on the "Past Updates" button in the selected goal      . "I want to know more about my military benefits" (pt-stated)   Not on track    Current Barriers:  [name] Memory Deficits . Limited education about VA benefits   Clinical Social Work Clinical Goal(s):  [name] Over the next 30 days,  client will work with SW to address concerns related to insurance benefits   Interventions:  SW encouraged Mrs. Bertha to outreach the [place] office to discuss patients needs as this SW is unable to obtain information   Patient Self Care Activities:  . Performs ADL's independently . Calls provider office for new concerns or questions  . Mrs. Heber reports understanding of reason she needs to call on behalf of the patient   Please see past updates related to this goal by clicking on the "Past Updates" button in the  selected goal      . COMPLETED: "I want to name a power of attorney" (pt-stated)       Current Barriers:  . Limited education about Advance Directives  Clinical Social Work Clinical Goal(s):  [name] Over the next 10 days, patient will work with SW to understand the importance of advance directives . Over the next 45 days, patient will complete advance directives and provide a copy to his health care provider.   Interventions: . Discussed plan to have document notarized. At this time- due to concerns with COVID-19 the patient will await visiting a notary. . SW reviewed future places of business in which a notary can be accessed.   Patient Self Care Activities:  . Performs ADL's independently . Calls provider office for new concerns or questions  Please see past updates related to this goal by clicking on the "Past Updates" button in the selected goal          Materials Provided: No: Patient declined  Follow Up Plan: SW will follow up with patient by phone over the next 2-3 weeks.   [name], BSW, CDP [facility] Care Management Social Worker [phone]

## 2018-08-28 NOTE — Patient Instructions (Signed)
Visit Information  Goals Addressed      Patient Stated   . COMPLETED: "I don't like taking medications but  I will try to do better" (pt-stated)       Current Barriers:   Knowledge deficit related to importance of taking medications exactly as prescribed  Impaired cognitive ability and memory loss related to Dementia  Nurse Case Manager Clinical Goal(s):  [name] Over the next 30 days, patient will demonstrate improved adherence to prescribed treatment plan for all health related issues as evidenced by patient/wife will report patient is taking all of his medications exactly as prescribed without missed doses.   Interventions:  . TELEPHONE CCM OUTREACH TO WIFE BARBARA FOR CCM FOLLOW UP   Assessed for patient adherence to taking his meds as prescribed (wife states patient is now cooperating with taking all meds exactly as prescribed)  Assessed for questions related to patient's medication administration (wife denies)  Patient Self Care Activities:   Wife verbalize understanding of today's education/instructions provided  . Patient is currently UNABLE TO independently self-administers medications (wife is administering) . Patient is now adhering to taking his medications as prescribed (wife states he cooperating better with taking meds)  Plan: . Goal met, no further follow up for this goal needed at this time  Please see past updates related to this goal by clicking on the "Past Updates" button in the selected goal       Other   . "He doesn't get up and move around or talk to me"   Not on track    Current Barriers:   Impaired cognitive ability and memory loss related to Dementia  Lack of physical mobility and or exercise secondary to Dementia  Nurse Case Manager Clinical Goal(s):  [name] Over the next 30 days, patient will work with in home PT and ST  to address needs related to impaired physical mobility and cognitive/memory issues  Interventions:   TELEPHONE CCM OUTREACH TO WIFE  BARBARA FOR CCM FOLLOW UP   Discussed with wife if in home PT/ST are still desired at present time or if she prefers to reconsider to start services after COVID-19 (wife would like to start services after COVID-19) . Scheduled a telephone follow up call with wife [name] in about 1-2 weeks   Patient Self Care Activities:   Wife verbalizes understanding of today's education/instructions provided  . Patient is currently UNABLE TO independently self-administers medications (wife is administering) . Patient is currently NOT engaging in any activities and or socialization   Please see past updates related to this goal by clicking on the "Past Updates" button in the selected goal     . "He is out of all his medications"       Wife stated:  Current Barriers:  [name] Knowledge deficits related to knowing how to best navigate [facility] when medication refills are needed . Lack of caregiver support  Nurse Case Manager Clinical Goal(s):  [name] Over the next 14 days, patient will have received all of his medications refills.   Over the next 30 days, wife will verbalize increased knowledge related to process to request med refills via [facility] mail order  Interventions:  . TELEPHONE CCM OUTREACH TO WIFE BARBARA FOR CCM FOLLOW UP  . Assessed for patient adherence to taking his meds as prescribed (wife states she did not receive his refills from [place], he is out of all meds) . Collaboration with provider [name], FNP to notify of need for refill of  all patient meds via [facility]-advised wife reports pt is out of all meds (message received from [facility] stating refills were sent) . Attempted call with wife to [facility], left wife's call back number via automated system for representative will call back to confirm refills were received . Instructed wife to call provider and or RNCM if further refills are needed . Instructed wife to advise [facility] patient is out of  all meds and to ask if shipment can be expedited . Instructed wife to call [facility] to order refills once patients supply is low in the future if she doesn't receive notification from [facility] that automated refills have been sent . Instructed wife that if refills have to picked up from [place], to ask pharmacist for assistance with cost if available (for example if a coupon or rebate is available)  Scheduled telephone CCM follow-up with wife [name]  Patient Self Care Activities:   Wife verbalize understanding of today's education/instructions provided  . Patient is currently UNABLE TO independently self-administers medications (wife is administering) . Patient is now adhering to taking his medications as prescribed (wife states he cooperating better with taking meds)  Plan:  . RNCM will follow up with wife [name] by telephone in  7-14 days  Initial goal documentation    . "He needs to have his eyes checked and needs to see the dentist"   Not on track    Current Barriers:  [name] Knowledge Deficit related to appropriate providers for Ophthalmology and Dental exams . Impaired cognitive ability and memory related to Dementia  Nurse Case Manager Clinical Goal(s):  [name] Over the next 30 days, patient will attend all scheduled medical appointments: patient will have new patient appointments scheduled with Ophthalmoloy and Dentist  Interventions:   TELEPHONE CCM OUTREACH TO WIFE Garza UP   Provided wife with information related to Optometry and Dental services offered through the [place] at the [place] location  Discussed current health plan change from traditional Medicare to [facility]  Discussed new benefits for patient regarding dental and vision care  Instructed wife to locate the list of providers given by healthplan  Instructed wife to schedule patient new patient appointments with each of the Specialist needed as appointments are  being scheduled as far out as June or July due to COVID-19  Educated wife on potential complication of abscess or blood infection from untreated tooth decay  Telephone CCM follow-up scheduled with wife [name]  Patient Self Care Activities:   Wife verbalizes understanding of today's education/instructions provided . Patient is currently UNABLE TO independently schedule and attend MD appointments independently (wife scheduling appts and is providing transportation)  Plan:  . RNCM will follow up with wife [name] by phone in 7-14 days  Please see past updates related to this goal by clicking on the "Past Updates" button in the selected goal        The patient verbalized understanding of instructions provided today and declined a print copy of patient instruction materials.   The CM team will reach out to the patient again over the next 7-14 days.    Barb Merino, RN,CCM Care Management Coordinator [place] [facility]  Direct Phone: [phone]

## 2018-08-29 ENCOUNTER — Telehealth: Payer: Self-pay

## 2018-09-03 ENCOUNTER — Telehealth: Payer: Self-pay

## 2018-09-04 ENCOUNTER — Other Ambulatory Visit: Payer: Self-pay | Admitting: Nurse Practitioner

## 2018-09-04 DIAGNOSIS — F0391 Unspecified dementia with behavioral disturbance: Secondary | ICD-10-CM

## 2018-09-04 DIAGNOSIS — H539 Unspecified visual disturbance: Secondary | ICD-10-CM

## 2018-09-04 DIAGNOSIS — R269 Unspecified abnormalities of gait and mobility: Secondary | ICD-10-CM

## 2018-09-04 DIAGNOSIS — K029 Dental caries, unspecified: Secondary | ICD-10-CM

## 2018-09-04 MED ORDER — RIVASTIGMINE 4.6 MG/24HR TD PT24: 4.6000 mg | MEDICATED_PATCH | Freq: Every day | TRANSDERMAL | 2 refills | Status: DC

## 2018-09-06 ENCOUNTER — Ambulatory Visit: Payer: Self-pay

## 2018-09-06 ENCOUNTER — Telehealth: Payer: Self-pay

## 2018-09-06 DIAGNOSIS — I10 Essential (primary) hypertension: Secondary | ICD-10-CM

## 2018-09-06 DIAGNOSIS — F0391 Unspecified dementia with behavioral disturbance: Secondary | ICD-10-CM

## 2018-09-06 NOTE — Telephone Encounter (Signed)
Called pt to see if he [facility]. No voicemail setup

## 2018-09-06 NOTE — Chronic Care Management (AMB) (Signed)
  Chronic Care Management   Outreach Note  09/06/2018 Name: [name] MRN: [id number] DOB: 1945-07-30  Referred by: [name], FNP Reason for referral : Chronic Care Management (CCM TELEPHONE FOLLOW UP )   An unsuccessful telephone outreach was attempted today. The patient was referred to the case management team by [name] FNP for assistance with Dementia with behavioral disturbance, unspecified dementia and Primary Essential Hypertension.   Follow Up Plan: The CM team will reach out to the patient again over the next 5-7 days.    Barb Merino, RN,CCM Care Management Coordinator [place] [facility]  Direct Phone: [phone]

## 2018-09-06 NOTE — Telephone Encounter (Signed)
-----   Message from [name], [place] sent at 09/04/2018  2:12 PM EDT ----- Regarding: FW: pharmacy request Call to see if the patient received his medications from [place], thanks  [place],   [name], [place], FNP-BC  ----- Message ----- From: [name] Sent: 07/26/2018  12:38 PM EDT To: [name], FNP Subject: pharmacy request                               Alethia Berthold! Sending an [place] that this patient needs Norvasc sent to [place]. His wife, [name], also requests her meds be filled through [facility] going forward as well. I know you are probably not the appropriate person for me to message but I am not sure who handles your patients and their refills. Let me know if you need more info.  Thanks, [name], BSW, CDP [facility] / [place] [facility] Social Worker [phone]

## 2018-09-11 ENCOUNTER — Ambulatory Visit: Payer: Self-pay

## 2018-09-11 ENCOUNTER — Telehealth: Payer: Self-pay

## 2018-09-11 DIAGNOSIS — F0391 Unspecified dementia with behavioral disturbance: Secondary | ICD-10-CM

## 2018-09-11 DIAGNOSIS — I10 Essential (primary) hypertension: Secondary | ICD-10-CM

## 2018-09-11 NOTE — Chronic Care Management (AMB) (Signed)
  Chronic Care Management   Outreach Note  09/11/2018 Name: [name] MRN: [id number] DOB: 02-04-1946  Referred by: [name], FNP Reason for referral : Care Coordination   I placed an unsuccessful call to the patient in efforts to follow up on progression of previous patient stated goals. I left a HIPAA compliant voice message requesting a return call.  Follow Up Plan: The CM team will reach out to the patient again over the next 7-10 days.    [name], BSW, CDP [facility] / [facility] Care Management Social Worker [phone]  Total time spent performing care coordination and/or care management activities with the patient by phone or face to face = 5 minutes.

## 2018-09-12 ENCOUNTER — Ambulatory Visit: Payer: Self-pay

## 2018-09-12 DIAGNOSIS — F0391 Unspecified dementia with behavioral disturbance: Secondary | ICD-10-CM

## 2018-09-12 DIAGNOSIS — I10 Essential (primary) hypertension: Secondary | ICD-10-CM

## 2018-09-12 NOTE — Patient Instructions (Signed)
Social Worker Visit Information  Goals we discussed today:  Goals Addressed            This Visit's Progress     Patient Stated   . "I need a back up transportation plan" (pt-stated)       Current Barriers:  [name] Memory Deficits - the patient has dementia and does not drive . Limited social support - the patient relies on his wife to provide transportation. The patients wife is battling cancer and concerned the patient may need alternate transportation resources during her treatment.  Clinical Social Work Clinical Goal(s):  [name] Over the next 30  days, client will work with SW to address concerns related to transportation barriers  Interventions:  Confirmed the patients application has been received by Darrell Jewel with [employer]  Telephonic follow up with the patients wife, Rashed Edler, to update on the [employer] application process changes due to COVID 39  Advised Mrs Shiraishi the patient would receive a letter in the mail in the coming weeks indicating whether he is approved for the [employer] program  Patient Self Care Activities:  . Performs ADL's independently . Currently unable to independently drive self. Patient relies on wife for transportation needs who is currently diagnosed with cancer    Please see past updates related to this goal by clicking on the "Past Updates" button in the selected goal          Materials Provided: No  Follow Up Plan: SW will follow up with patient by phone over the next 3-4 weeks   [name], [place], CDP [facility] / [place] Management Social Worker [phone]

## 2018-09-12 NOTE — Chronic Care Management (AMB) (Signed)
  Chronic Care Management    Clinical Social Work Follow Up Note  09/12/2018 Name: [name] MRN: [id number] DOB: 04-21-1946  [name] is a 73 y.o. year old male who is a primary care patient of [name], [place]. The CCM team was consulted for assistance with chronic care management and care coordination of resource needs.   Review of patient status, including review of consultants reports, other relevant assessments, and collaboration with appropriate care team members and the patient's provider was performed as part of comprehensive patient evaluation and provision of chronic care management services.    I received an incoming call from the patients wife, [name]. Patient HIPAA identifiers confirmed.   Goals Addressed            This Visit's Progress     Patient Stated   . "I need a back up transportation plan" (pt-stated)       Current Barriers:  [name] Memory Deficits - the patient has dementia and does not drive . Limited social support - the patient relies on his wife to provide transportation. The patients wife is battling cancer and concerned the patient may need alternate transportation resources during her treatment.  Clinical Social Work Clinical Goal(s):  [name] Over the next 30  days, client will work with SW to address concerns related to transportation barriers  Interventions:  Confirmed the patients application has been received by Darrell Jewel with [employer]  Telephonic follow up with the patients wife, [name], to update on the [employer] application process changes due to COVID 75  Advised Mrs Kitner the patient would receive a letter in the mail in the coming weeks indicating whether he is approved for the [employer] program  Patient Self Care Activities:  . Performs ADL's independently . Currently unable to independently drive self. Patient relies on wife for transportation needs who is currently diagnosed with cancer    Please see past updates related to  this goal by clicking on the "Past Updates" button in the selected goal          Follow Up Plan: SW will follow up with patient by phone over the next 3-4 weeks   [name], BSW, CDP [facility] / [place] Worker [phone]  Total time spent performing care coordination and/or care management activities with the patient by phone or face to face = 15 minutes.

## 2018-09-14 ENCOUNTER — Ambulatory Visit: Payer: Self-pay

## 2018-09-14 ENCOUNTER — Telehealth: Payer: Self-pay

## 2018-09-14 DIAGNOSIS — F0391 Unspecified dementia with behavioral disturbance: Secondary | ICD-10-CM

## 2018-09-14 DIAGNOSIS — R269 Unspecified abnormalities of gait and mobility: Secondary | ICD-10-CM

## 2018-09-14 DIAGNOSIS — I10 Essential (primary) hypertension: Secondary | ICD-10-CM

## 2018-09-14 NOTE — Chronic Care Management (AMB) (Signed)
  Chronic Care Management   Outreach Note  09/14/2018 Name: [name] MRN: [id number] DOB: 1945/07/08  Referred by: [name], FNP Reason for referral : Chronic Care Management (CCM RN TELEPHONE FOLLOW UP)   Second unsuccessful telephone outreach was attempted today. The patient was referred to the case management team by [name] FNP for assistance with Essential hypertension, pre-diabetes, dementia with behavioral disturbance.  Follow Up Plan: Telephone follow up appointment with CCM team member scheduled for: 09/17/18   Barb Merino, [facility] Care Management Coordinator [place] [facility]  Direct Phone: [phone]

## 2018-09-17 ENCOUNTER — Telehealth: Payer: Medicare Other

## 2018-09-17 ENCOUNTER — Other Ambulatory Visit: Payer: Self-pay

## 2018-09-17 ENCOUNTER — Ambulatory Visit (INDEPENDENT_AMBULATORY_CARE_PROVIDER_SITE_OTHER): Payer: Medicare Other

## 2018-09-17 DIAGNOSIS — F0391 Unspecified dementia with behavioral disturbance: Secondary | ICD-10-CM | POA: Diagnosis not present

## 2018-09-17 DIAGNOSIS — I10 Essential (primary) hypertension: Secondary | ICD-10-CM

## 2018-09-17 DIAGNOSIS — R269 Unspecified abnormalities of gait and mobility: Secondary | ICD-10-CM

## 2018-09-17 NOTE — Chronic Care Management (AMB) (Signed)
  Chronic Care Management   Follow Up Note   09/17/2018 Name: JACSON RAPAPORT MRN: [id number] DOB: 1945/06/08  Referred by: [name], FNP Reason for referral : Chronic Care Management (CCM TELEPHONE FOLLOW UP )   JAVARRI SEGAL is a 73 y.o. year old male who is a primary care patient of [name], [place]. The CCM team was consulted for assistance with chronic disease management and care coordination needs.    Review of patient status, including review of consultants reports, relevant laboratory and other test results, and collaboration with appropriate care team members and the patient's provider was performed as part of comprehensive patient evaluation and provision of chronic care management services.    I spoke with Mrs. Dib by telephone today.   Goals Addressed    . "He doesn't get up and move around or talk to me"   On track    Current Barriers:   Impaired cognitive ability and memory loss related to Dementia  Lack of physical mobility and or exercise secondary to Dementia  Nurse Case Manager Clinical Goal(s):  [name] Over the next 30 days, patient will work with in home PT and ST  to address needs related to impaired physical mobility and cognitive/memory issues  Interventions:   TELEPHONE CCM OUTREACH TO WIFE BARBARA FOR CCM FOLLOW UP  . Instructed wife that [facility] has been trying to reach her to start patient's PT/ST  . Provided Ms. Manwarren with the phone number for the [facility] and advised her the referral will expire tomorrow on 09/18/18 (wife verbalizes understanding and will call today to start services) . Scheduled a telephone follow up call with wife [name] in about 1-2 weeks   Patient Self Care Activities:   Wife verbalizes understanding of today's education/instructions provided  . Patient is currently UNABLE TO independently self-administers medications (wife is administering) . Patient is currently NOT engaging in any activities and or socialization    Please see past updates related to this goal by clicking on the "Past Updates" button in the selected goal          Telephone follow up appointment with CCM team member scheduled for: 1-2 weeks   Barb Merino, [facility] Care Management Coordinator [place] Management/[facility]  Direct Phone: [phone]

## 2018-09-17 NOTE — Patient Instructions (Signed)
Visit Information  Goals Addressed            This Visit's Progress   . "He doesn't get up and move around or talk to me"   On track    Current Barriers:   Impaired cognitive ability and memory loss related to Dementia  Lack of physical mobility and or exercise secondary to Dementia  Nurse Case Manager Clinical Goal(s):  [name] Over the next 30 days, patient will work with in home PT and ST  to address needs related to impaired physical mobility and cognitive/memory issues  Interventions:   TELEPHONE CCM OUTREACH TO WIFE BARBARA FOR CCM FOLLOW UP  . Instructed wife that [facility] has been trying to reach her to start patient's PT/ST  . Provided Ms. Corman with the phone number for the [facility] and advised her the referral will expire tomorrow on 09/18/18 (wife verbalizes understanding and will call today to start services) . Scheduled a telephone follow up call with wife [name] in about 1-2 weeks   Patient Self Care Activities:   Wife verbalizes understanding of today's education/instructions provided  . Patient is currently UNABLE TO independently self-administers medications (wife is administering) . Patient is currently NOT engaging in any activities and or socialization   Please see past updates related to this goal by clicking on the "Past Updates" button in the selected goal           The patient verbalized understanding of instructions provided today and declined a print copy of patient instruction materials.   Telephone follow up appointment with CCM team member scheduled for: 1-2 weeks  Barb Merino, [facility] Care Management Coordinator [place] [facility]  Direct Phone: [phone]

## 2018-09-18 ENCOUNTER — Ambulatory Visit: Payer: Self-pay

## 2018-09-18 DIAGNOSIS — I10 Essential (primary) hypertension: Secondary | ICD-10-CM

## 2018-09-18 DIAGNOSIS — R269 Unspecified abnormalities of gait and mobility: Secondary | ICD-10-CM

## 2018-09-18 DIAGNOSIS — F0391 Unspecified dementia with behavioral disturbance: Secondary | ICD-10-CM

## 2018-09-18 NOTE — Chronic Care Management (AMB) (Signed)
  Chronic Care Management   Follow Up Note   09/18/2018 Name: [name] MRN: [id number] DOB: 01/06/1946  Referred by: [name], FNP Reason for referral : Chronic Care Management (CCM Telephone Follow Up )   [name] is a 73 y.o. year old male who is a primary care patient of [name], [place]. The CCM team was consulted for assistance with chronic disease management and care coordination needs.    Review of patient status, including review of consultants reports, relevant laboratory and other test results, and collaboration with appropriate care team members and the patient's provider was performed as part of comprehensive patient evaluation and provision of chronic care management services.    I received a voice message from patient's wife [name] following up on [place] services.   Goals Addressed      Patient Stated   . "He stays up all night and sleeps during the day" (pt-stated)       Current Barriers:  . Alzheimer's Dementia . Impaired Sleep Pattern secondary to Alzheimer's   Nurse Case Manager Clinical Goal(s):  [name] Over the next 30 days, patient's wife will work with the CCM team to address needs related to patient wandering at night and will verbalize having a good understanding about how to best manage this condition.  Interventions:   Completed CCM telephone follow up with wife [name] . Evaluation of current treatment plan related to impaired sleep pattern and patient's adherence to plan as established by provider . Provided education to wife re: disease process for Alzheimer's Dementia, including impaired sleep disturbance and wandering that may occur . Collaborated with [name] regarding the need to provide patient's wife additional education related to impaired sleep/wandering secondary to Alzheimer's . Discussed plans with patient for ongoing care management follow up and provided patient with direct contact information for care  management team . Scheduled a CCM follow-up call with wife [name] in 1-2 weeks  Patient Self Care Activities:   Wife verbalizes understanding of today's education/instructions provided  Patient is currently UNABLE TO independently perform self care (wife is his primary caregiver)  Initial goal documentation      Wife stated   . "He doesn't get up and move around or talk to me"       Current Barriers:   Impaired cognitive ability and memory loss related to Dementia  Lack of physical mobility and or exercise secondary to Dementia  Nurse Case Manager Clinical Goal(s):  [name] Over the next 30 days, patient will work with in home PT and ST  to address needs related to impaired physical mobility and cognitive/memory issues  Interventions:  [name] Voice message received from patient's wife [name] stating patient will start in home SN/PT/ST from [facility] next Monday . Scheduled a telephone follow up call with wife [name] in about 1-2 weeks   Patient Self Care Activities:   Wife verbalizes understanding of today's education/instructions provided  . Patient is currently UNABLE TO independently self-administers medications (wife is administering) . Patient is currently NOT engaging in any activities and or socialization  Please see past updates related to this goal by clicking on the "Past Updates" button in the selected goal        Telephone follow up appointment with CCM team member scheduled for: 1-2 weeks  Barb Merino, [facility] Care Management Coordinator [place] [facility]  Direct Phone: [phone]

## 2018-09-19 ENCOUNTER — Telehealth: Payer: Self-pay

## 2018-09-20 ENCOUNTER — Telehealth: Payer: Self-pay

## 2018-09-20 NOTE — Telephone Encounter (Signed)
Patient's wife called and stated he received some patches in the mail but she is not sure what they are for and I advised her that is for the patient's dementia and he should stop taking the Memantine. YRL,RMA

## 2018-09-21 ENCOUNTER — Telehealth: Payer: Self-pay

## 2018-09-21 ENCOUNTER — Ambulatory Visit: Payer: Self-pay

## 2018-09-21 DIAGNOSIS — I10 Essential (primary) hypertension: Secondary | ICD-10-CM

## 2018-09-21 DIAGNOSIS — F0391 Unspecified dementia with behavioral disturbance: Secondary | ICD-10-CM

## 2018-09-21 NOTE — Chronic Care Management (AMB) (Signed)
  Chronic Care Management   Outreach Note  09/21/2018 Name: [name] MRN: [id number] DOB: May 02, 1946  Referred by: [name], FNP Reason for referral : Care Coordination   An unsuccessful telephone outreach was attempted today to follow up on concerns for patient sleep disturbance and offer interventions related to patients dementia.  Follow Up Plan: A HIPPA compliant phone message was left for the patient providing contact information and requesting a return call. SW will attempt to outreach the patients spouse/caregiver over the next week.  [name], BSW, CDP [facility] / [facility] Care Management Social Worker [phone]  Total time spent performing care coordination and/or care management activities with the patient by phone or face to face = 3 minutes.

## 2018-09-24 ENCOUNTER — Ambulatory Visit: Payer: Self-pay

## 2018-09-24 DIAGNOSIS — R269 Unspecified abnormalities of gait and mobility: Secondary | ICD-10-CM

## 2018-09-24 DIAGNOSIS — Z87891 Personal history of nicotine dependence: Secondary | ICD-10-CM | POA: Diagnosis not present

## 2018-09-24 DIAGNOSIS — I10 Essential (primary) hypertension: Secondary | ICD-10-CM | POA: Diagnosis not present

## 2018-09-24 DIAGNOSIS — F0391 Unspecified dementia with behavioral disturbance: Secondary | ICD-10-CM

## 2018-09-24 DIAGNOSIS — E782 Mixed hyperlipidemia: Secondary | ICD-10-CM | POA: Diagnosis not present

## 2018-09-24 DIAGNOSIS — E559 Vitamin D deficiency, unspecified: Secondary | ICD-10-CM | POA: Diagnosis not present

## 2018-09-24 NOTE — Chronic Care Management (AMB) (Signed)
Chronic Care Management    Clinical Social Work Follow Up Note  09/24/2018 Name: [name] MRN: [id number] DOB: 1945/08/19  [name] is a 73 y.o. year old male who is a primary care patient of [name], [place]. The CCM team was consulted for assistance with Level of Care Concerns.   Review of patient status, including review of consultants reports, other relevant assessments, and collaboration with appropriate care team members and the patient's provider was performed as part of comprehensive patient evaluation and provision of chronic care management services.     Goals Addressed            This Visit's Progress     Patient Stated   . "He stays up all night and sleeps during the day" (pt-stated)       Current Barriers:  . Alzheimer's Dementia . Impaired Sleep Pattern secondary to Alzheimer's   Nurse Case Manager Clinical Goal(s):  [name] Over the next 30 days, patient's wife will work with the CCM team to address needs related to patient wandering at night and will verbalize having a good understanding about how to best manage this condition.  Interventions:  . Telephonic follow up by CCM SW to the patients wife [name] . Educated [name] on disease progression as well as interventions to assist with sleep disturbance . Assessed [name]'s desire for the patient regarding long term plans - [name] reports she would like to keep the patient home if at all possible  Patient Self Care Activities:   Wife verbalizes understanding of today's education/instructions provided  Patient is currently UNABLE TO independently perform self care (wife is his primary caregiver)   Please see past updates related to this goal by clicking on the "Past Updates" button in the selected goal         Other   . "I need more help"       Spouse stated Current Barriers:  . Limited social support . Level of care concerns . Family and relationship dysfunction . Memory Deficits  . Inability to perform IADL's independently  Clinical Social Work Clinical Goal(s):  [name] Over the next 60 days, client will work with SW to address concerns related to level of care concerns  Interventions: . Interviewed patients wife/caregiver to obtain knowledge regarding patient care needs . Educated the patients wife on Aid and Attendance program provided by the [place] . Explained that although the patient is a veteran, he may not qualify for this program - wife stated understanding . Determined the patients wife would prefer to keep the patient in the home rather than look at long term placement . Advised the patients wife that this SW would initiate the application process to begin determination of patients VA benefit related to aid and attendance . Outreached [name] from [facility] to determine if any VA programs would cover the patient to receive services from [facility] . Collaboration with the patients primary care provider regarding patient newly established goal . Completion of patient portion of all VA forms including intent to file, third party release, and aid and attendance application . Provided the forms vie secure e-mail to the patients primary providers office manager to enable her to print them and hand deliver to the provider for completion  Patient Self Care Activities:  . Currently UNABLE TO independently perform iADl's or ADl's safely without assistance  Initial goal documentation       Follow Up Plan:  SW will follow up with patient by phone over the next 7-10 days.   [name], BSW, CDP [facility] / [facility] Care Management Social Worker [phone]  Total time spent performing care coordination and/or care management activities with the patient by phone or face to face = 52 minutes.

## 2018-09-24 NOTE — Patient Instructions (Signed)
Social Worker Visit Information  Goals we discussed today:  Goals Addressed            This Visit's Progress     Patient Stated   . "He stays up all night and sleeps during the day" (pt-stated)       Current Barriers:  . Alzheimer's Dementia . Impaired Sleep Pattern secondary to Alzheimer's   Nurse Case Manager Clinical Goal(s):  [name] Over the next 30 days, patient's wife will work with the CCM team to address needs related to patient wandering at night and will verbalize having a good understanding about how to best manage this condition.  Interventions:  . Telephonic follow up by CCM SW to the patients wife Skippy Marhe[place]ka . Educated Mrs Guarisco on disease progression as well as interventions to assist with sleep disturbance . Assessed Mrs Schnyder's desire for the patient regarding long term plans - Mrs Deroy reports she would like to keep the patient home if at all possible  Patient Self Care Activities:   Wife verbalizes understanding of today's education/instructions provided  Patient is currently UNABLE TO independently perform self care (wife is his primary caregiver)   Please see past updates related to this goal by clicking on the "Past Updates" button in the selected goal         Other   . "I need more help"       Spouse stated Current Barriers:  . Limited social support . Level of care concerns . Family and relationship dysfunction . Memory Deficits . Inability to perform IADL's independently  Clinical Social Work Clinical Goal(s):  [name] Over the next 60 days, client will work with SW to address concerns related to level of care concerns  Interventions: . Interviewed patients wife/caregiver to obtain knowledge regarding patient care needs . Educated the patients wife on Aid and Attendance program provided by the [place] . Explained that although the patient is a veteran, he may not qualify for this program - wife stated understanding . Determined the patients wife would  prefer to keep the patient in the home rather than look at long term placement . Advised the patients wife that this SW would initiate the application process to begin determination of patients VA benefit related to aid and attendance . Outreached [name] from [facility] to determine if any VA programs would cover the patient to receive services from [facility] . Collaboration with the patients primary care provider regarding patient newly established goal . Completion of patient portion of all VA forms including intent to file, third party release, and aid and attendance application . Provided the forms vie secure e-mail to the patients primary providers office manager to enable her to print them and hand deliver to the provider for completion  Patient Self Care Activities:  . Currently UNABLE TO independently perform iADl's or ADl's safely without assistance  Initial goal documentation         Materials Provided: Verbal education about aid and attendance provided by phone  Follow Up Plan: SW will follow up with patient by phone over the next 7-10 days   [name], [place], CDP [facility] [place] [facility] Social Worker [phone]

## 2018-09-25 ENCOUNTER — Telehealth: Payer: Self-pay

## 2018-09-25 ENCOUNTER — Ambulatory Visit: Payer: Self-pay

## 2018-09-25 DIAGNOSIS — I10 Essential (primary) hypertension: Secondary | ICD-10-CM | POA: Diagnosis not present

## 2018-09-25 DIAGNOSIS — F0391 Unspecified dementia with behavioral disturbance: Secondary | ICD-10-CM | POA: Diagnosis not present

## 2018-09-25 DIAGNOSIS — R269 Unspecified abnormalities of gait and mobility: Secondary | ICD-10-CM

## 2018-09-25 NOTE — Telephone Encounter (Signed)
Called and left pt v/m notifing her that the patient's forms are completed and ready to be picked up. YRL,RMA

## 2018-09-25 NOTE — Chronic Care Management (AMB) (Signed)
  Chronic Care Management   Outreach Note  09/25/2018 Name: [name] MRN: [id number] DOB: Jun 21, 1945  Referred by: [name], FNP Reason for referral : Chronic Care Management (CCM Telephone Follow Up )   An unsuccessful telephone outreach was attempted today. The patient was referred to the case management team by [name] FNP for assistance with chronic disease management and care coordination needs.    Follow Up Plan: The CM team will reach out to the patient again over the next 5-7 days.   Barb Merino, RN,CCM Care Management Coordinator [place] [facility]  Direct Phone: [phone]

## 2018-09-26 ENCOUNTER — Ambulatory Visit: Payer: Medicare Other

## 2018-09-26 DIAGNOSIS — I10 Essential (primary) hypertension: Secondary | ICD-10-CM

## 2018-09-26 DIAGNOSIS — E559 Vitamin D deficiency, unspecified: Secondary | ICD-10-CM

## 2018-09-26 DIAGNOSIS — F0391 Unspecified dementia with behavioral disturbance: Secondary | ICD-10-CM

## 2018-09-26 NOTE — Chronic Care Management (AMB) (Signed)
Chronic Care Management    Clinical Social Work Follow Up Note  09/26/2018 Name: [name] MRN: [id number] DOB: 1945-10-18  [name] is a 73 y.o. year old male who is a primary care patient of [name], [place]. The CCM team was consulted for assistance with [employer].   Review of patient status, including review of consultants reports, other relevant assessments, and collaboration with appropriate care team members and the patient's provider was performed as part of comprehensive patient evaluation and provision of chronic care management services.     Goals Addressed            This Visit's Progress     Patient Stated   . COMPLETED: "I want to know more about my military benefits" (pt-stated)       Current Barriers:  [name] Kitchen Memory Deficits . Limited education about VA benefits   Clinical Social Work Clinical Goal(s):  [name] Kitchen Over the next 30 days, client will work with SW to address concerns related to insurance benefits   Interventions:  SW encouraged [name] to outreach the [place] office to discuss patients needs as this SW is unable to obtain information   Patient Self Care Activities:  . Performs ADL's independently . Calls provider office for new concerns or questions  . [name] reports understanding of reason she needs to call on behalf of the patient   Please see past updates related to this goal by clicking on the "Past Updates" button in the selected goal        Other   . "He doesn't get up and move around or talk to me"   On track    Current Barriers:   Impaired cognitive ability and memory loss related to Dementia  Lack of physical mobility and or exercise secondary to Dementia  Nurse Case Manager Clinical Goal(s):  [name] Kitchen Over the next 30 days, patient will work with in home PT and ST  to address needs related to impaired physical mobility and cognitive/memory issues  Interventions:   Telephonic follow up by CCM SW to patients spouse [name]  Confirmed home health has started  Assessed patients engagement in home health- Eugene  reports the patient is more alert and active during the day. It is reported he is engaging with the therapists and enjoys the visits  Patient Self Care Activities:   Wife verbalizes understanding of today's education/instructions provided  . Patient is currently UNABLE TO independently self-administers medications (wife is administering) . Patient is currently NOT engaging in any activities and or socialization   Please see past updates related to this goal by clicking on the "Past Updates" button in the selected goal       . "I need more help"   On track    Spouse stated Current Barriers:  . Limited social support . Level of care concerns . Family and relationship dysfunction . Memory Deficits . Inability to perform IADL's independently  Clinical Social Work Clinical Goal(s):  [name] Kitchen Over the next 60 days, client will work with SW to address concerns related to level of care concerns  Interventions:  Collaboration with [name] from [facility] regarding program enrollment  Determined any patient over the Medicaid eligibility threshold would be required to pay an out of pocket co-pay regardless of [name] status  Telephonic outreach by CCM SW to patients spouse and caregiver  Advised [name] that patients aid and attendance application had been completed and was ready  to be picked up at the patients provider office  Encouraged [name] to get paperwork signed and submitted to the [place] in a timely manner to initiate application  Patient Self Care Activities:  . Currently UNABLE TO independently perform iADl's or ADl's safely without assistance  Please see past updates related to this goal by clicking on the "Past Updates" button in the selected goal          Follow Up Plan: SW will follow up with patient by phone over the next two weeks  [name], BSW, CDP  [facility] [place] Worker [phone]  Total time spent performing care coordination and/or care management activities with the patient by phone or face to face = 15 minutes.

## 2018-09-26 NOTE — Patient Instructions (Signed)
Social Worker Visit Information  Goals we discussed today:  Goals Addressed            This Visit's Progress     Patient Stated   . COMPLETED: "I want to know more about my military benefits" (pt-stated)       Current Barriers:  [name] Memory Deficits . Limited education about VA benefits   Clinical Social Work Clinical Goal(s):  [name] Over the next 30 days, client will work with SW to address concerns related to insurance benefits   Interventions:  SW encouraged Mrs. Friedhoff to outreach the [place] office to discuss patients needs as this SW is unable to obtain information   Patient Self Care Activities:  . Performs ADL's independently . Calls provider office for new concerns or questions  . Mrs. Narula reports understanding of reason she needs to call on behalf of the patient   Please see past updates related to this goal by clicking on the "Past Updates" button in the selected goal        Other   . "He doesn't get up and move around or talk to me"   On track    Current Barriers:   Impaired cognitive ability and memory loss related to Dementia  Lack of physical mobility and or exercise secondary to Dementia  Nurse Case Manager Clinical Goal(s):  [name] Over the next 30 days, patient will work with in home PT and ST  to address needs related to impaired physical mobility and cognitive/memory issues  Interventions:   Telephonic follow up by CCM SW to patients spouse Mrs Needle  Confirmed home health has started  Assessed patients engagement in home health- Mrs Hilleary reports the patient is more alert and active during the day. It is reported he is engaging with the therapists and enjoys the visits  Patient Self Care Activities:   Wife verbalizes understanding of today's education/instructions provided  . Patient is currently UNABLE TO independently self-administers medications (wife is administering) . Patient is currently NOT engaging in any activities and or  socialization   Please see past updates related to this goal by clicking on the "Past Updates" button in the selected goal       . "I need more help"   On track    Spouse stated Current Barriers:  . Limited social support . Level of care concerns . Family and relationship dysfunction . Memory Deficits . Inability to perform IADL's independently  Clinical Social Work Clinical Goal(s):  [name] Over the next 60 days, client will work with SW to address concerns related to level of care concerns  Interventions: . Collaboration with [name] from [facility] regarding program enrollment . Determined any patient over the Medicaid eligibility threshold would be required to pay an out of pocket co-pay regardless of VA status . Telephonic outreach by CCM SW to patients spouse and caregiver . Advised Mrs Bulkley that patients aid and attendance application had been completed and was ready to be picked up at the patients provider office . Encouraged Mrs Canino to get paperwork signed and submitted to the [place] in a timely manner to initiate application  Patient Self Care Activities:  . Currently UNABLE TO independently perform iADl's or ADl's safely without assistance  Please see past updates related to this goal by clicking on the "Past Updates" button in the selected goal          Materials Provided: Verbal education about aid and attendance program provided by  phone  Follow Up Plan: SW will follow up with patient by phone over the next two weeks  [name], BSW, CDP [facility] [place] [facility] Social Worker [phone]

## 2018-10-02 ENCOUNTER — Ambulatory Visit: Payer: Self-pay

## 2018-10-02 ENCOUNTER — Telehealth: Payer: Medicare Other

## 2018-10-02 ENCOUNTER — Other Ambulatory Visit: Payer: Self-pay

## 2018-10-02 DIAGNOSIS — R269 Unspecified abnormalities of gait and mobility: Secondary | ICD-10-CM

## 2018-10-02 DIAGNOSIS — F0391 Unspecified dementia with behavioral disturbance: Secondary | ICD-10-CM

## 2018-10-02 DIAGNOSIS — I10 Essential (primary) hypertension: Secondary | ICD-10-CM

## 2018-10-03 ENCOUNTER — Telehealth: Payer: Self-pay

## 2018-10-03 NOTE — Patient Instructions (Signed)
Visit Information  Goals Addressed    . "He doesn't get up and move around or talk to me"   On track    Current Barriers:   Impaired cognitive ability and memory loss related to Dementia  Lack of physical mobility and or exercise secondary to Dementia  Nurse Case Manager Clinical Goal(s):  [name] Over the next 90 days, patient will work with in home PT and ST  to address needs related to impaired physical mobility and cognitive/memory issues 10/03/18-Target goal date revised to 90 days due to delays starting therapy due to COVID-19  Interventions:   Completed CCM RN Telephone Follow Up with wife Ormond Lazo  Assessed for patient's adherence to following his HEP as prescribed by PT/OT/ST  Assessed for effectiveness from therapy such as increased physical activity and increased cognitive stimulation (wife states the patient is very engaged and he is both physically active and more socially active since therapy has started)  Assessed for other concerns related to patients physical mobility and or cognitive activity (wife denies)  Assessed for further home safety concerns (wife states none at this time)  Scheduled CCM RN Telephone Follow Up call with wife [name]  Patient Self Care Activities:   Wife verbalizes understanding of today's education/instructions provided  . Patient is currently UNABLE TO independently self-administers medications (wife is administering) . Patient is currently NOT engaging in any activities and or socialization  Please see past updates related to this goal by clicking on the "Past Updates" button in the selected goal     . COMPLETED: "He is out of all his medications"       Wife stated:  Current Barriers:  [name] Knowledge deficits related to knowing how to best navigate [facility] when medication refills are needed . Lack of caregiver support  Nurse Case Manager Clinical Goal(s):  [name] Over the next 14 days, patient will have received all of his medications  refills.   Over the next 30 days, wife will verbalize increased knowledge related to process to request med refills via [facility] mail order  Interventions:  . Completed CCM RN Telephone Follow UP with wife [name]  Assessed for patient adherence to taking his meds as prescribed    Assessed for understanding and comfort level with refilling medications when supply is low  Assessed for questions and or concerns related to patient's medication regimen  Goal Met-no further issues identified related to patient's medication management  Patient Self Care Activities:   Wife verbalize understanding of today's education/instructions provided  . Patient is currently UNABLE TO independently self-administers medications (wife is administering) . Patient is now adhering to taking his medications as prescribed (wife states he cooperating better with taking meds)  Please see past updates related to this goal by clicking on the "Past Updates" button in the selected goal     . "He needs to have his eyes checked and needs to see the dentist"   Not on track    Current Barriers:  [name] Knowledge Deficit related to appropriate providers for Ophthalmology and Dental exams . Impaired cognitive ability and memory related to Dementia   Nurse Case Manager Clinical Goal(s):  [name] Over the next 90 days, patient will attend all scheduled medical appointments: patient will have new patient appointments scheduled with Ophthalmoloy and Dentist-10/03/18-Goal date revised to 90 days due to delays with getting appointments made due to COVID-19  Interventions:   [place] UP   Provided wife  with information related to Optometry and Dental services offered through the [place] at the [place] location  Discussed current health plan change from traditional Medicare to [facility]  Discussed new benefits for patient regarding dental and vision care  Instructed  wife to locate the list of providers given by healthplan  Instructed wife to schedule patient new patient appointments with each of the Specialist needed as appointments are being scheduled as far out as June or July due to COVID-19  Educated wife on potential complication of abscess or blood infection from untreated tooth decay  Telephone CCM follow-up scheduled with wife [name]   Patient Self Care Activities:   Wife verbalizes understanding of today's education/instructions provided . Patient is currently UNABLE TO independently schedule and attend MD appointments independently (wife scheduling appts and is providing transportation)  Please see past updates related to this goal by clicking on the "Past Updates" button in the selected goal        The patient verbalized understanding of instructions provided today and declined a print copy of patient instruction materials.   Telephone follow up appointment with CCM team member scheduled for: 7-14 days  Barb Merino, [facility] Care Management Coordinator [place] [facility]  Direct Phone: [phone]

## 2018-10-03 NOTE — Chronic Care Management (AMB) (Signed)
Chronic Care Management   Follow Up Note   10/02/2018 Name: TADAO EMIG MRN: [id number] DOB: 1945/08/27  Referred by: [name], FNP Reason for referral : Chronic Care Management (CCM RN Telephone Follow Up )   AMIT LEECE is a 73 y.o. year old male who is a primary care patient of [name], [place]. The CCM team was consulted for assistance with chronic disease management and care coordination needs.    Review of patient status, including review of consultants reports, relevant laboratory and other test results, and collaboration with appropriate care team members and the patient's provider was performed as part of comprehensive patient evaluation and provision of chronic care management services.    I spoke with Mrs. Standley today to follow up Mr. Jeansonne's progress with in home physical and cognitive therapies.   Goals Addressed    . "He doesn't get up and move around or talk to me"   On track    Current Barriers:   Impaired cognitive ability and memory loss related to Dementia  Lack of physical mobility and or exercise secondary to Dementia  Nurse Case Manager Clinical Goal(s):  [name] Over the next 90 days, patient will work with in home PT and ST  to address needs related to impaired physical mobility and cognitive/memory issues 10/03/18-Target goal date revised to 90 days due to delays starting therapy due to COVID-19  Interventions:   Completed CCM RN Telephone Follow Up with wife Wilkes Potvin  Assessed for patient's adherence to following his HEP as prescribed by PT/OT/ST  Assessed for effectiveness from therapy such as increased physical activity and increased cognitive stimulation (wife states the patient is very engaged and he is both physically active and more socially active since therapy has started)  Assessed for other concerns related to patients physical mobility and or cognitive activity (wife denies)  Assessed for further home safety concerns (wife states  none at this time)  Scheduled CCM RN Telephone Follow Up call with wife [name]  Patient Self Care Activities:   Wife verbalizes understanding of today's education/instructions provided  . Patient is currently UNABLE TO independently self-administers medications (wife is administering) . Patient is currently NOT engaging in any activities and or socialization  Please see past updates related to this goal by clicking on the "Past Updates" button in the selected goal     . COMPLETED: "He is out of all his medications"       Wife stated:  Current Barriers:  [name] Knowledge deficits related to knowing how to best navigate [facility] when medication refills are needed . Lack of caregiver support  Nurse Case Manager Clinical Goal(s):  [name] Over the next 14 days, patient will have received all of his medications refills.   Over the next 30 days, wife will verbalize increased knowledge related to process to request med refills via [employer] [facility] mail order  Interventions:  . Completed CCM RN Telephone Follow UP with wife [name]  Assessed for patient adherence to taking his meds as prescribed    Assessed for understanding and comfort level with refilling medications when supply is low  Assessed for questions and or concerns related to patient's medication regimen  Goal Met-no further issues identified related to patient's medication management  Patient Self Care Activities:   Wife verbalize understanding of today's education/instructions provided  . Patient is currently UNABLE TO independently self-administers medications (wife is administering) . Patient is now adhering to taking his medications as prescribed (wife states he  cooperating better with taking meds)  Please see past updates related to this goal by clicking on the "Past Updates" button in the selected goal      . "He needs to have his eyes checked and needs to see the dentist"   Not on track    Current Barriers:  [name]  Knowledge Deficit related to appropriate providers for Ophthalmology and Dental exams . Impaired cognitive ability and memory related to Dementia   Nurse Case Manager Clinical Goal(s):  [name] Over the next 90 days, patient will attend all scheduled medical appointments: patient will have new patient appointments scheduled with Ophthalmoloy and Dentist-10/03/18-Goal date revised to 90 days due to delays with getting appointments made due to COVID-19  Interventions:   [place] UP   Provided wife with information related to Optometry and Dental services offered through the [place] at the [place] location  Discussed current health plan change from traditional Medicare to [facility]  Discussed new benefits for patient regarding dental and vision care  Instructed wife to locate the list of providers given by healthplan  Instructed wife to schedule patient new patient appointments with each of the Specialist needed as appointments are being scheduled as far out as June or July due to COVID-19  Educated wife on potential complication of abscess or blood infection from untreated tooth decay  Telephone CCM follow-up scheduled with wife [name]   Patient Self Care Activities:   Wife verbalizes understanding of today's education/instructions provided . Patient is currently UNABLE TO independently schedule and attend MD appointments independently (wife scheduling appts and is providing transportation)  Please see past updates related to this goal by clicking on the "Past Updates" button in the selected goal         Telephone follow up appointment with CCM team member scheduled for: 7-14 days  Barb Merino, [facility] Care Management Coordinator [place] [facility]  Direct Phone: [phone]

## 2018-10-04 ENCOUNTER — Telehealth: Payer: Self-pay

## 2018-10-09 ENCOUNTER — Ambulatory Visit: Payer: Self-pay

## 2018-10-09 ENCOUNTER — Telehealth: Payer: Self-pay

## 2018-10-09 DIAGNOSIS — E782 Mixed hyperlipidemia: Secondary | ICD-10-CM

## 2018-10-09 DIAGNOSIS — I10 Essential (primary) hypertension: Secondary | ICD-10-CM

## 2018-10-09 DIAGNOSIS — E559 Vitamin D deficiency, unspecified: Secondary | ICD-10-CM

## 2018-10-09 DIAGNOSIS — F0391 Unspecified dementia with behavioral disturbance: Secondary | ICD-10-CM

## 2018-10-09 NOTE — Chronic Care Management (AMB) (Signed)
  Chronic Care Management   Outreach Note  10/09/2018 Name: [name] MRN: [id number] DOB: Jan 01, 1946  Referred by: [name], FNP Reason for referral : Care Coordination   An unsuccessful telephone outreach was attempted today. The patient was referred to the case management team by for assistance with chronic care management and care coordination.   Follow Up Plan: A HIPPA compliant phone message was left for the patient providing contact information and requesting a return call.  The CM team will reach out to the patient again over the next 7 days.   [name], BSW, CDP [facility] / [facility] Care Management Social Worker [phone]  Total time spent performing care coordination and/or care management activities with the patient by phone or face to face = 3 minutes.

## 2018-10-10 ENCOUNTER — Telehealth: Payer: Self-pay

## 2018-10-10 ENCOUNTER — Ambulatory Visit (INDEPENDENT_AMBULATORY_CARE_PROVIDER_SITE_OTHER): Payer: Medicare Other

## 2018-10-10 DIAGNOSIS — I10 Essential (primary) hypertension: Secondary | ICD-10-CM | POA: Diagnosis not present

## 2018-10-10 DIAGNOSIS — F0391 Unspecified dementia with behavioral disturbance: Secondary | ICD-10-CM

## 2018-10-10 DIAGNOSIS — E782 Mixed hyperlipidemia: Secondary | ICD-10-CM

## 2018-10-10 DIAGNOSIS — E559 Vitamin D deficiency, unspecified: Secondary | ICD-10-CM

## 2018-10-10 NOTE — Patient Instructions (Addendum)
Social Worker Visit Information  Goals we discussed today:  Goals Addressed            This Visit's Progress   . "I need more help"   Not on track    Spouse stated Current Barriers:  . Limited social support . Level of care concerns . Family and relationship dysfunction . Memory Deficits . Inability to perform IADL's independently  Clinical Social Work Clinical Goal(s):  [name] Over the next 60 days, client will work with SW to address concerns related to level of care concerns  Interventions: . Collaboration with Hiram Comber, SW with [facility] . Received an update that the patient is wearing the same outfit multiple days, episodes on incontinence, and in denial of care needs . CCM SW reviewed previous CCM interventions and resources outreached in response to patient goal to obtain more assistance . Determined the patient would benefit from long term care placement. Unfortunately, the patients spouse/caregiver continues to report desires to keep the patient in the home . Scheduled outreach to follow up with the patient and Mrs. [name] regarding status of aide and attendance application as well as long term plan for the patients care needs  Patient Self Care Activities:  . Currently UNABLE TO independently perform iADl's or ADl's safely without assistance  Please see past updates related to this goal by clicking on the "Past Updates" button in the selected goal          Materials Provided: No. Patient not reached.  Follow Up Plan: SW will follow up with patient by phone over the next 5 days   [name], [place], CDP [facility] / [place] Management Social Worker [phone]

## 2018-10-10 NOTE — Chronic Care Management (AMB) (Signed)
  Chronic Care Management    Clinical Social Work Follow Up Note  10/10/2018 Name: [name] MRN: [id number] DOB: [birth year]  [name] is a 73 y.o. year old male who is a primary care patient of [name], [place]. The CCM team was consulted for assistance with [employer] and Level of Care Concerns.   Review of patient status, including review of consultants reports, other relevant assessments, and collaboration with appropriate care team members and the patient's provider was performed as part of comprehensive patient evaluation and provision of chronic care management services.     I received an inbound call from the patients home health social worker with [facility] to discuss concerns of patients caregiver needs.  Goals Addressed            This Visit's Progress   . "I need more help"   Not on track    Spouse stated Current Barriers:  . Limited social support . Level of care concerns . Family and relationship dysfunction . Memory Deficits . Inability to perform IADL's independently  Clinical Social Work Clinical Goal(s):  [name] Over the next 60 days, client will work with SW to address concerns related to level of care concerns  Interventions: . Collaboration with [name], SW with [facility] . Received an update that the patient is wearing the same outfit multiple days, episodes on incontinence, and in denial of care needs . CCM SW reviewed previous CCM interventions and resources outreached in response to patient goal to obtain more assistance . Determined the patient would benefit from long term care placement. Unfortunately, the patients spouse/caregiver continues to report desires to keep the patient in the home . Scheduled outreach to follow up with the patient and Mrs. [name] regarding status of aide and attendance application as well as long term plan for the patients care needs  Patient Self Care Activities:  . Currently UNABLE TO  independently perform iADl's or ADl's safely without assistance  Please see past updates related to this goal by clicking on the "Past Updates" button in the selected goal          Follow Up Plan: SW will follow up with patient by phone over the next 5 days   [name], [place], CDP [facility] / [facility] Care Management Social Worker [phone]  Total time spent performing care coordination and/or care management activities with the patient by phone or face to face = 27 minutes.

## 2018-10-11 ENCOUNTER — Ambulatory Visit: Payer: Self-pay

## 2018-10-11 DIAGNOSIS — F0391 Unspecified dementia with behavioral disturbance: Secondary | ICD-10-CM | POA: Diagnosis not present

## 2018-10-11 DIAGNOSIS — E782 Mixed hyperlipidemia: Secondary | ICD-10-CM | POA: Diagnosis not present

## 2018-10-11 DIAGNOSIS — E559 Vitamin D deficiency, unspecified: Secondary | ICD-10-CM

## 2018-10-11 DIAGNOSIS — I10 Essential (primary) hypertension: Secondary | ICD-10-CM

## 2018-10-11 NOTE — Chronic Care Management (AMB) (Signed)
  Chronic Care Management    Clinical Social Work Follow Up Note  10/11/2018 Name: BENTLEY FISSEL MRN: [id number] DOB: 1946/03/19  EXZAVIER RUDERMAN is a 73 y.o. year old male who is a primary care patient of [name], [place]. The CCM team was consulted for assistance with [employer].   Review of patient status, including review of consultants reports, other relevant assessments, and collaboration with appropriate care team members and the patient's provider was performed as part of comprehensive patient evaluation and provision of chronic care management services.     Goals Addressed            This Visit's Progress   . "I need more help"       Spouse stated Current Barriers:  . Limited social support . Level of care concerns . Family and relationship dysfunction . Memory Deficits . Inability to perform IADL's independently  Clinical Social Work Clinical Goal(s):  [name] Over the next 60 days, client will work with SW to address concerns related to level of care concerns  Interventions: . Collaboration with Hiram Comber, SW with [facility] . Provided with update from home health reporting the patients son, Jahking Lesser has been in contact with home health SW to discuss patient care needs. Home Health SW intends to mail [place] information on [place] benefits as well as caregiver resources . CCM SW informed Elonda Husky is readily available to support the patient care needs and is open to call from [place] . Inbound call from patients spouse Jahid Weida who reports she has not yet picked up completed aide and attendance application from provider office . Encouraged Mrs. Langan to contacted the providers office to confirm procedure to pick up document . Received verbal permission to speak with Raimundo Corbit [phone]) regarding patient care needs   Patient Self Care Activities:  . Currently UNABLE TO independently perform iADl's or ADl's safely without assistance  Please see past  updates related to this goal by clicking on the "Past Updates" button in the selected goal        Follow Up Plan: SW will follow up with the patients son Noam Karaffa over the next 7 days.   [name], BSW, CDP [facility] / [facility] Care Management Social Worker [phone]  Total time spent performing care coordination and/or care management activities with the patient by phone or face to face = 27 minutes.

## 2018-10-11 NOTE — Patient Instructions (Signed)
Social Worker Visit Information  Goals we discussed today:  Goals Addressed            This Visit's Progress   . "I need more help"       Spouse stated Current Barriers:  . Limited social support . Level of care concerns . Family and relationship dysfunction . Memory Deficits . Inability to perform IADL's independently  Clinical Social Work Clinical Goal(s):  [name] Over the next 60 days, client will work with SW to address concerns related to level of care concerns  Interventions: . Collaboration with Hiram Comber, SW with [facility] . Provided with update from home health reporting the patients son, [name] has been in contact with home health SW to discuss patient care needs. Home Health SW intends to mail [place] information on [place] benefits as well as caregiver resources . CCM SW informed Elonda Husky is readily available to support the patient care needs and is open to call from [name] . Inbound call from patients spouse [name] who reports she has not yet picked up completed aide and attendance application from provider office . Encouraged [name] to contacted the providers office to confirm procedure to pick up document . Received verbal permission to speak with Brandt Chaney [phone]) regarding patient care needs   Patient Self Care Activities:  . Currently UNABLE TO independently perform iADl's or ADl's safely without assistance  Please see past updates related to this goal by clicking on the "Past Updates" button in the selected goal          Materials Provided: Verbal education about VA application provided by phone  Follow Up Plan: SW will follow up with the patients son over the next 7 days   [name], BSW, CDP [facility] / [place] Management Social Worker [phone]

## 2018-10-12 ENCOUNTER — Telehealth: Payer: Self-pay

## 2018-10-15 ENCOUNTER — Ambulatory Visit: Payer: Self-pay

## 2018-10-15 ENCOUNTER — Telehealth: Payer: Self-pay

## 2018-10-15 ENCOUNTER — Ambulatory Visit: Payer: Medicare Other

## 2018-10-15 DIAGNOSIS — E782 Mixed hyperlipidemia: Secondary | ICD-10-CM

## 2018-10-15 DIAGNOSIS — I10 Essential (primary) hypertension: Secondary | ICD-10-CM | POA: Diagnosis not present

## 2018-10-15 DIAGNOSIS — F0391 Unspecified dementia with behavioral disturbance: Secondary | ICD-10-CM

## 2018-10-15 DIAGNOSIS — E559 Vitamin D deficiency, unspecified: Secondary | ICD-10-CM

## 2018-10-15 NOTE — Patient Instructions (Signed)
Social Worker Visit Information  Goals we discussed today:  Goals Addressed            This Visit's Progress     Patient Stated   . COMPLETED: "He stays up all night and sleeps during the day" (pt-stated)       Current Barriers:  . Alzheimer's Dementia . Impaired Sleep Pattern secondary to Alzheimer's   Nurse Case Manager Clinical Goal(s):  [name] Over the next 30 days, patient's wife will work with the CCM team to address needs related to patient wandering at night and will verbalize having a good understanding about how to best manage this condition.  Interventions:  . Telephonic follow up by CCM SW to the patients son Jodeci Roarty . Educated Thoma Paulsen on the disease process of dementia and the normalcy for a person living with dementia to fall asleep when not engaged . Discussed options to assist the patient with sleep at night including increased activity/engagement during the day, and/or adding a sleep aide in the evening . Josie Saunders these options have been discussed with the patients caregiver/spouse Mrs Seres in the past . Determined the patients spouse/caregiver continues to be against placement of the patient or adding a sleep aide. SW will complete goal as education has been provided.  Patient Self Care Activities:   Wife verbalizes understanding of today's education/instructions provided  Patient is currently UNABLE TO independently perform self care (wife is his primary caregiver)   Please see past updates related to this goal by clicking on the "Past Updates" button in the selected goal       . COMPLETED: "I need housing repairs" (pt-stated)       Current Barriers:  . Financial constraints . Memory Deficits  Clinical Social Work Clinical Goal(s):  [name] Over the next 30 days, client will work with SW to address concerns related to water leaks and HVAC repairs.   Interventions: . CCM SW closing goal as the patient does not qualify for patient assistance  programs due to income.  Patient Self Care Activities:  . Performs ADL's independently   Please see past updates related to this goal by clicking on the "Past Updates" button in the selected goal        Other   . "I need more help"   On track    Spouse stated Current Barriers:  . Limited social support . Level of care concerns . Family and relationship dysfunction . Memory Deficits . Inability to perform IADL's independently  Clinical Social Work Clinical Goal(s):  [name] Over the next 60 days, client will work with SW to address concerns related to level of care concerns  Interventions:  Telephonic outreach to patients son Issaac Shipper [phone])   Reviewed patient care needs   Discussed caregiver burden on Mrs Lafond given her own health  Determined Elonda Husky is trying to be supportive and is interested in becoming the patients POA to alleviate some stress on Mrs Davitt. It is also reported Elonda Husky plans to come to [place] and stay with the patient to allow a weekend away for Mrs Cordarryl Monrreal on the process in applying for aide and attendance and the length of time it may take for approval  Informed Elonda Husky of other care options such as adult day programs and [facility]  Encouraged Elonda Husky to speak with the patients spouse/caregiver Mrs Vangorder about caregiver burden and caregiver options for the patient  Inbound call from Mrs. [name] to review aide  and attendance application steps  Collaboration with provider office to determine the patients aide and attendance application is still awaiting pick up at the front desk  Advised Mrs Sedor to pick this application up as soon as possible to complete with signatures and submit to the [place] office  Inbound call from Mrs. Francis who confirms application has been picked up  Reviewed parts of application that must be completed by the patient and/or Mrs Brock prior to submission  Encouraged Mrs Keo to contact CCM SW is further  assistance is needed  Patient Self Care Activities:  . Currently UNABLE TO independently perform iADl's or ADl's safely without assistance  Please see past updates related to this goal by clicking on the "Past Updates" button in the selected goal          Materials Provided: Verbal education about VA benefits and dementia provided by phone  Follow Up Plan: SW will follow up with patient by phone over the next 2-3 weeks  [name], [place], CDP [facility] / [place] Management Social Worker [phone]

## 2018-10-15 NOTE — Chronic Care Management (AMB) (Signed)
Chronic Care Management    Clinical Social Work Follow Up Note  10/15/2018 Name: Eugene Eugene MRN: [id number] DOB: [birth year]  Eugene Eugene is a 73 y.o. year old male who is a primary care patient of Eugene Eugene, Eugene Eugene. The CCM team was consulted for assistance with Eugene Eugene and Eugene Eugene.   Review of patient status, including review of consultants reports, other relevant assessments, and collaboration with appropriate care team members and the patient's provider was performed as part of comprehensive patient evaluation and provision of chronic care management services.     Care coordination call to both the patients son Eugene Eugene) and spouse Eugene Eugene) to assist with below stated goals.  Goals Addressed            This Visit's Progress     Patient Stated   . COMPLETED: "He stays up all night and sleeps during the day" (pt-stated)       Current Barriers:  . Alzheimer's Dementia . Impaired Sleep Pattern secondary to Alzheimer's   Nurse Case Manager Clinical Goal(s):  [name] Over the next 30 days, patient's wife will work with the CCM team to address needs related to patient wandering at night and will verbalize having a good understanding about how to best manage this condition.  Interventions:  . Telephonic follow up by CCM SW to the patients son Eugene Eugene . Educated Eugene Eugene on the disease process of dementia and the normalcy for a person living with dementia to fall asleep when not engaged . Eugene options to assist the patient with sleep at night including increased activity/engagement during the day, and/or adding a sleep aide in the evening . Eugene Eugene with the patients caregiver/spouse Eugene Eugene in the past . Determined the patients spouse/caregiver continues to be against placement of the patient or adding a sleep aide. SW will complete goal as education has been provided.  Patient Self Care  Activities:   Wife verbalizes understanding of today's education/instructions provided  Patient is currently UNABLE TO independently perform self care (wife is his primary caregiver)   Please see past updates related to this goal by clicking on the "Past Updates" button in the selected goal     . COMPLETED: "I need housing repairs" (pt-stated)       Current Barriers:  . Financial constraints . Memory Deficits  Clinical Social Work Clinical Goal(s):  [name] Over the next 30 days, client will work with SW to address Eugene related to water leaks and HVAC repairs.   Interventions: . CCM SW closing goal as the patient does not qualify for patient assistance programs due to income.  Patient Self Care Activities:  . Performs ADL's independently   Please see past updates related to this goal by clicking on the "Past Updates" button in the selected goal        Other   . "I need more help"   On track    Spouse stated Current Barriers:  . Limited social support . Eugene Eugene . Family and relationship dysfunction . Memory Deficits . Inability to perform IADL's independently  Clinical Social Work Clinical Goal(s):  [name] Over the next 60 days, client will work with SW to address Eugene related to Eugene Eugene  Interventions:  Telephonic outreach to patients son Eugene Eugene Eugene Eugene)   Reviewed patient care needs   Eugene caregiver burden on Eugene Eugene given her own health  Determined Eugene Eugene  is trying to be supportive and is interested in becoming the patients POA to alleviate some stress on Eugene Eugene. It is also reported Eugene Eugene plans to come to Eugene Eugene and stay with the patient to allow a weekend away for Eugene Eugene Eugene on the process in applying for aide and attendance and the length of time it may take for approval  Informed Eugene Eugene of other care options such as adult day programs and [facility]  Encouraged Eugene Eugene to speak with the  patients spouse/caregiver Eugene Eugene about caregiver burden and caregiver options for the patient  Inbound call from Eugene. Eugene Eugene to review aide and attendance application steps  Collaboration with provider Warren to determine the patients aide and attendance application is still awaiting pick up at the front desk  Advised Eugene Eugene to pick this application up as soon as possible to complete with signatures and submit to the Eugene Eugene  Inbound call from Eugene Eugene who confirms application has been picked up  Reviewed parts of application that must be completed by the patient and/or Eugene Eugene prior to submission  Encouraged Eugene Eugene to contact CCM SW is further assistance is needed  Patient Self Care Activities:  . Currently UNABLE TO independently perform iADl's or ADl's safely without assistance  Please see past updates related to this goal by clicking on the "Past Updates" button in the selected goal         Follow Up Plan: SW will follow up with the patient and his spouse over the next 2-3 weeks.   Eugene Eugene, BSW, CDP [facility] Care Management Social Worker [phone]  Total time spent performing care coordination and/or care management activities with the patient by phone or face to face = 29 minutes.

## 2018-10-15 NOTE — Chronic Care Management (AMB) (Signed)
   Chronic Care Management   Outreach Note  10/15/2018 Name: [name] MRN: [id number] DOB: 1945-07-07  Referred by: [name], FNP Reason for referral : Chronic Care Management (CCM RN Telephone Follow Up)   An unsuccessful telephone outreach was attempted today. The patient was referred to the case management team by [name] FNP for assistance with chronic care management and care coordination.    Follow Up Plan: The CCM team will reach out to the patient again over the next 7-10 days.   Barb Merino, RN,CCM Care Management Coordinator [place] [facility]  Direct Phone: [phone]

## 2018-10-16 ENCOUNTER — Ambulatory Visit: Payer: Self-pay

## 2018-10-16 DIAGNOSIS — F0391 Unspecified dementia with behavioral disturbance: Secondary | ICD-10-CM | POA: Diagnosis not present

## 2018-10-16 DIAGNOSIS — E782 Mixed hyperlipidemia: Secondary | ICD-10-CM

## 2018-10-16 DIAGNOSIS — R269 Unspecified abnormalities of gait and mobility: Secondary | ICD-10-CM

## 2018-10-16 DIAGNOSIS — I10 Essential (primary) hypertension: Secondary | ICD-10-CM

## 2018-10-17 NOTE — Chronic Care Management (AMB) (Signed)
  Chronic Care Management   Follow Up Note   10/16/2018 Name: [name] MRN: [id number] DOB: August 28, 1945  Referred by: [name], FNP Reason for referral : Chronic Care Management (CCM RN Telephone Follow Up)   [name] is a 73 y.o. year old male who is a primary care patient of [name], [place]. The CCM team was consulted for assistance with chronic disease management and care coordination needs.    Review of patient status, including review of consultants reports, relevant laboratory and other test results, and collaboration with appropriate care team members and the patient's provider was performed as part of comprehensive patient evaluation and provision of chronic care management services.   I spoke with [name] by telephone today for a f/u on [name] HHS.    Goals Addressed    . COMPLETED: "He doesn't get up and move around or talk to me"       Current Barriers:   Impaired cognitive ability and memory loss related to Dementia  Lack of physical mobility and or exercise secondary to Dementia  Nurse Case Manager Clinical Goal(s):  [name] Over the next 90 days, patient will work with in home PT and ST  to address needs related to impaired physical mobility and cognitive/memory issues 10/03/18-Target goal date revised to 90 days due to delays starting therapy due to COVID-19-Goal Met  CCM RN CM Interventions:  Completed on 10/16/18: with wife [name]  Assessed for patient's adherence to following his HEP as prescribed by PT/OT/ST-wife reports [name] is adhering with her assistance  Confirmed patient has completed all home health services-wife is very pleased with how the services went for [name]-she feels he has increased stamina, endurance and seems to be more socially motivated  Assessed for other concerns related to patients physical mobility and or cognitive activity (wife denies)  Assessed for further home safety concerns (wife reports having no  further home safety concerns at this time)  Patient Self Care Activities:   Wife verbalizes understanding of today's education/instructions provided  . Patient is currently UNABLE TO independently self-administers medications (wife is administering) . Patient is currently NOT engaging in any activities and or socialization  Please see past updates related to this goal by clicking on the "Past Updates" button in the selected goal         The CM team will reach out to the patient again over the next 3-4 weeks.   Eugene Merino, RN,CCM Care Management Coordinator [place] [facility]  Direct Phone: [phone]

## 2018-10-17 NOTE — Patient Instructions (Addendum)
Visit Information  Goals Addressed    . COMPLETED: "He doesn't get up and move around or talk to me"       Current Barriers:   Impaired cognitive ability and memory loss related to Dementia  Lack of physical mobility and or exercise secondary to Dementia  Nurse Case Manager Clinical Goal(s):  [name] Over the next 90 days, patient will work with in home PT and ST  to address needs related to impaired physical mobility and cognitive/memory issues 10/03/18-Target goal date revised to 90 days due to delays starting therapy due to COVID-19-Goal Met  CCM RN CM Interventions:  Completed on 10/16/18: with wife Corydon Schweiss  Assessed for patient's adherence to following his HEP as prescribed by PT/OT/ST-wife reports Mr. Donath is adhering with her assistance  Confirmed patient has completed all home health services-wife is very pleased with how the services went for Mr. Delawder-she feels he has increased stamina, endurance and seems to be more socially motivated  Assessed for other concerns related to patients physical mobility and or cognitive activity (wife denies)  Assessed for further home safety concerns (wife reports having no further home safety concerns at this time)  Patient Self Care Activities:   Wife verbalizes understanding of today's education/instructions provided  . Patient is currently UNABLE TO independently self-administers medications (wife is administering) . Patient is currently NOT engaging in any activities and or socialization  Please see past updates related to this goal by clicking on the "Past Updates" button in the selected goal        The patient verbalized understanding of instructions provided today and declined a print copy of patient instruction materials.   The CCM team will reach out to the patient again over the next 3-4 weeks.   Barb Merino, RN,CCM Care Management Coordinator [place] [facility]  Direct Phone:  [phone]

## 2018-10-25 ENCOUNTER — Telehealth: Payer: Self-pay

## 2018-10-30 ENCOUNTER — Telehealth: Payer: Self-pay

## 2018-10-30 ENCOUNTER — Ambulatory Visit: Payer: Self-pay

## 2018-10-30 DIAGNOSIS — E559 Vitamin D deficiency, unspecified: Secondary | ICD-10-CM

## 2018-10-30 DIAGNOSIS — I10 Essential (primary) hypertension: Secondary | ICD-10-CM

## 2018-10-30 DIAGNOSIS — F0391 Unspecified dementia with behavioral disturbance: Secondary | ICD-10-CM

## 2018-10-30 NOTE — Chronic Care Management (AMB) (Signed)
  Chronic Care Management   Outreach Note  10/30/2018 Name: TAARIQ LEITZ MRN: [id number] DOB: [birth year]  Referred by: [name], FNP Reason for referral : Care Coordination   An unsuccessful telephone outreach was attempted today. The patient was referred to the case management team by for assistance with chronic care management and care coordination.   Follow Up Plan: A HIPPA compliant phone message was left for the patient providing contact information and requesting a return call.  The care management team will reach out to the patient again over the next 7-10 days.   [name], BSW, CDP Social Worker, Certified Dementia Practitioner [place] [facility] [place] [facility] [phone]  Total time spent performing care coordination and/or care management activities with the patient by phone or face to face = 4 minutes.

## 2018-11-01 ENCOUNTER — Ambulatory Visit: Payer: Self-pay

## 2018-11-01 DIAGNOSIS — F0391 Unspecified dementia with behavioral disturbance: Secondary | ICD-10-CM

## 2018-11-01 NOTE — Chronic Care Management (AMB) (Signed)
  Chronic Care Management    Clinical Social Work Follow Up Note  11/01/2018 Name: [name] MRN: [id number] DOB: November 05, 1945  [name] is a 73 y.o. year old male who is a primary care patient of [name], [place]. The CCM team was consulted for assistance with [employer] and Level of Care Concerns.   Review of patient status, including review of consultants reports, other relevant assessments, and collaboration with appropriate care team members and the patient's provider was performed as part of comprehensive patient evaluation and provision of chronic care management services.    I received an in bound call from the patients wife/caregiver [name].    Goals Addressed            This Visit's Progress   . "I need more help"   On track    Spouse stated Current Barriers:  . Limited social support . Level of care concerns . Family and relationship dysfunction . Memory Deficits . Inability to perform IADL's independently  Clinical Social Work Clinical Goal(s):  [name] Over the next 60 days, client will work with SW to address concerns related to level of care concerns  Interventions:  Inbound call from the patients spouse who confirmed submitting [place] and Attendance application via mail  Encouraged Mrs. Liby to keep an eye on the mail for information from the [place] regarding next steps in application process  Scheduled follow up call to assess progress within the next 4 weeks.  Patient Self Care Activities:  . Currently UNABLE TO independently perform iADl's or ADl's safely without assistance  Please see past updates related to this goal by clicking on the "Past Updates" button in the selected goal          Follow Up Plan: SW will follow up with patient by phone over the next 4 weeks   [name], BSW, CDP Social Worker, Certified Dementia Practitioner [place] [facility] [place] [facility] [phone]  Total time spent performing care coordination  and/or care management activities with the patient by phone or face to face = 9 minutes.

## 2018-11-01 NOTE — Patient Instructions (Signed)
Social Worker Visit Information  Goals we discussed today:  Goals Addressed            This Visit's Progress   . "I need more help"   On track    Spouse stated Current Barriers:  . Limited social support . Level of care concerns . Family and relationship dysfunction . Memory Deficits . Inability to perform IADL's independently  Clinical Social Work Clinical Goal(s):  [name] Over the next 60 days, client will work with SW to address concerns related to level of care concerns  Interventions:  Inbound call from the patients spouse who confirmed submitting [place] and Attendance application via mail  Encouraged Mrs. Kutzer to keep an eye on the mail for information from the [place] regarding next steps in application process  Scheduled follow up call to assess progress within the next 4 weeks.  Patient Self Care Activities:  . Currently UNABLE TO independently perform iADl's or ADl's safely without assistance  Please see past updates related to this goal by clicking on the "Past Updates" button in the selected goal           Follow Up Plan: SW will follow up with patient by phone over the next 4 weeks  [name], BSW, CDP Social Worker, Certified Dementia Practitioner [place] [facility] [place] [facility] [phone]

## 2018-11-06 ENCOUNTER — Telehealth: Payer: Self-pay

## 2018-11-09 ENCOUNTER — Ambulatory Visit: Payer: Medicare Other | Admitting: Nurse Practitioner

## 2018-11-14 ENCOUNTER — Ambulatory Visit: Payer: Medicare Other | Admitting: Nurse Practitioner

## 2018-11-20 ENCOUNTER — Other Ambulatory Visit: Payer: Self-pay | Admitting: Nurse Practitioner

## 2018-11-27 ENCOUNTER — Ambulatory Visit (INDEPENDENT_AMBULATORY_CARE_PROVIDER_SITE_OTHER): Payer: Medicare Other

## 2018-11-27 DIAGNOSIS — E782 Mixed hyperlipidemia: Secondary | ICD-10-CM | POA: Diagnosis not present

## 2018-11-27 DIAGNOSIS — I10 Essential (primary) hypertension: Secondary | ICD-10-CM | POA: Diagnosis not present

## 2018-11-27 DIAGNOSIS — F0391 Unspecified dementia with behavioral disturbance: Secondary | ICD-10-CM

## 2018-11-27 NOTE — Patient Instructions (Signed)
Visit Information  Goals Addressed    . "He needs to have his eyes checked and needs to see the dentist"   On track    Current Barriers:  [name] Knowledge Deficit related to appropriate providers for Ophthalmology and Dental exams . Impaired cognitive ability and memory related to Dementia  Nurse Case Manager Clinical Goal(s):  [name] Over the next 120 days, patient will attend all scheduled medical appointments: patient will have new patient appointments scheduled with Ophthalmoloy and Dentist-10/17/18-Goal date revised to 120 days due to delays with getting appointments made due to COVID-19  CCM RN CM Interventions:  11/27/18 completed call with wife [name]    Reviewed and discussed with wife that Dental and Optometry services are offered through the [place] at the [place] location  Encouraged wife to schedule these new patient appointments when ready and offered assistance in doing so if needed - Mrs. Sockwell verbalizes understanding and states she plans to schedule these appointments once completing the [place] packet she has recently received pertaining to Mr. Barth's VA benefits  Discussed plans with patient for ongoing care management follow up and provided patient with direct contact information for care management team  Patient Self Care Activities:   Wife verbalizes understanding of today's education/instructions provided . Patient is currently UNABLE TO independently schedule and attend MD appointments independently (wife scheduling appts and is providing transportation)  Please see past updates related to this goal by clicking on the "Past Updates" button in the selected goal     . "I need more help"       Spouse stated Current Barriers:  . Limited social support . Level of care concerns . Family and relationship dysfunction . Memory Deficits . Inability to perform IADL's independently  Clinical Social Work Clinical Goal(s):  [name] Over the next 60 days, client will work with SW to  address concerns related to level of care concerns  Interventions:  Inbound call from the patients spouse who confirmed submitting [place] and Attendance application via mail  Encouraged Mrs. Bacchi to keep an eye on the mail for information from the [place] regarding next steps in application process  Scheduled follow up call to assess progress within the next 4 weeks.  CCM RN CM Interventions 11/27/18 completed call with wife [name]  . Received inbound call from Mrs. Bignell stating she needs assistance with completing the VA packet she received pertaining to Mr. Bostelman's VA benefits - she is requesting to speak with [name] to assist . Discussed that Tillie Rung has a follow up call scheduled with her for this week on Thursday - Mrs. Sasso stated this will be a good time for Tillie Rung to call her and she will be available to take her call in hopes to have [name]'s help with guidance on how to complete the [place] packet . Notified Tillie Rung of Mrs. Merrow's call and request for assistance . Discussed plans with patient for ongoing care management follow up and provided patient with direct contact information for care management team  Patient Self Care Activities:  . Currently UNABLE TO independently perform iADl's or ADl's safely without assistance  Please see past updates related to this goal by clicking on the "Past Updates" button in the selected goal       The patient verbalized understanding of instructions provided today and declined a print copy of patient instruction materials.   The care management team will reach out to the patient again over the next 2-3 weeks.   Chequita Mofield,  RN, BSN, [place] Management Coordinator [place] [facility]  Direct Phone: [phone]

## 2018-11-27 NOTE — Chronic Care Management (AMB) (Signed)
Chronic Care Management   Follow Up Note   11/27/2018 Name: [name] MRN: [id number] DOB: 08-Dec-1945  Referred by: Eugene Brine, FNP Reason for referral : Chronic Care Management (CCM RNCM Telephone Follow Up )   [name] is a 73 y.o. year old male who is a primary care patient of [name], [name]. The CCM team was consulted for assistance with chronic disease management and care coordination needs.    Review of patient status, including review of consultants reports, relevant laboratory and other test results, and collaboration with appropriate care team members and the patient's provider was performed as part of comprehensive patient evaluation and provision of chronic care management services.    I received an inbound call from [name] today to request assistance with [name] VA packet.   Goals Addressed    . "He needs to have his eyes checked and needs to see the dentist"   On track    Current Barriers:  [name] Knowledge Deficit related to appropriate providers for Ophthalmology and Dental exams . Impaired cognitive ability and memory related to Dementia  Nurse Case Manager Clinical Goal(s):  [name] Over the next 120 days, patient will attend all scheduled medical appointments: patient will have new patient appointments scheduled with Ophthalmoloy and Dentist-10/17/18-Goal date revised to 120 days due to delays with getting appointments made due to COVID-19  CCM RN CM Interventions:  11/27/18 completed call with wife [name]    Reviewed and discussed with wife that Dental and Optometry services are offered through the [place] at the [place] location  Encouraged wife to schedule these new patient appointments when ready and offered assistance in doing so if needed - [name] verbalizes understanding and states she plans to schedule these appointments once completing the [place] packet she has recently received pertaining to [name]'s VA benefits  Discussed plans with  patient for ongoing care management follow up and provided patient with direct contact information for care management team  Patient Self Care Activities:   Wife verbalizes understanding of today's education/instructions provided . Patient is currently UNABLE TO independently schedule and attend MD appointments independently (wife scheduling appts and is providing transportation)  Please see past updates related to this goal by clicking on the "Past Updates" button in the selected goal     . "I need more help"       Spouse stated Current Barriers:  . Limited social support . Level of care concerns . Family and relationship dysfunction . Memory Deficits . Inability to perform IADL's independently  Clinical Social Work Clinical Goal(s):  [name] Over the next 60 days, client will work with SW to address concerns related to level of care concerns  Interventions:  Inbound call from the patients spouse who confirmed submitting [place] and Attendance application via mail  Encouraged [name] to keep an eye on the mail for information from the [place] regarding next steps in application process  Scheduled follow up call to assess progress within the next 4 weeks.  CCM RN CM Interventions 11/27/18 completed call with wife [name]  . Received inbound call from [name] stating she needs assistance with completing the VA packet she received pertaining to [name]'s VA benefits - she is requesting to speak with [name] to assist . Discussed that [name] has a follow up call scheduled with her for this week on Thursday - [name] stated this will be a good time for [name] to call her and she  will be available to take her call in hopes to have [name]'s help with guidance on how to complete the [place] packet . Notified [name] of [name]'s call and request for assistance . Discussed plans with patient for ongoing care management follow up and provided patient with direct contact information for  care management team  Patient Self Care Activities:  . Currently UNABLE TO independently perform iADl's or ADl's safely without assistance  Please see past updates related to this goal by clicking on the "Past Updates" button in the selected goal        The care management team will reach out to the patient again over the next 2-3 weeks.   Eugene Merino, RN, BSN, CCM Care Management Coordinator [place] [facility]  Direct Phone: [phone]

## 2018-11-29 ENCOUNTER — Ambulatory Visit: Payer: Medicare Other

## 2018-11-29 DIAGNOSIS — F0391 Unspecified dementia with behavioral disturbance: Secondary | ICD-10-CM

## 2018-11-29 NOTE — Patient Instructions (Signed)
Social Worker Visit Information  Goals we discussed today:  Goals Addressed            This Visit's Progress   . "I need more help"       Spouse stated Current Barriers:  . Limited social support . Level of care concerns . Family and relationship dysfunction . Memory Deficits . Inability to perform IADL's independently  Clinical Social Work Clinical Goal(s):  [name] Over the next 60 days, client will work with SW to address concerns related to level of care concerns . 11/29/18- Over the next 15 days patient's wife will contact a VA Benefits Specialist to obtain assistance with next steps for patient to receive aide and attendance  CCM SW Interventions: Completed 11/29/18 with Tonia Ghent  Inbound call from the patients spouse who reports receiving forms in the mail from the [place] for her to complete regarding patients aide and attendance application   "I want to make an appointment with you so you can look at these and explain to me how to fill them out"  Explained to Mrs Balkcom CCM SW is still currently working from home and not currently seeing patients face to face  Advised Mrs Platner of the importance in contacting a VA Benefits Specialist who is more knowledgeable of the application process and can provide more help with specific VA benefit questions  Attempted to provide Mrs Eckstein with the contact number to the local [facility] specialist office   Informed by Mrs Pickup she has a contact number that was provided with the form. Mrs Ferber further reports her son, Elonda Husky plans to assist with form completion during his next visit to the patients home  Encouraged Mrs. Lindblad to contact the [place] representative in order to obtain information regarding paperwork as well as to request the patient be assigned a VA caseworker who can be her main point of contact regarding future VA benefits questions  CCM RN CM Interventions 11/27/18 completed call with wife [name]  . Received inbound  call from Mrs. Pilson stating she needs assistance with completing the VA packet she received pertaining to Mr. Olesen's VA benefits - she is requesting to speak with [name] to assist . Discussed that Tillie Rung has a follow up call scheduled with her for this week on Thursday - Mrs. Banik stated this will be a good time for Tillie Rung to call her and she will be available to take her call in hopes to have Spruha Weight's help with guidance on how to complete the [place] packet . Notified Tillie Rung of Mrs. Debski's call and request for assistance . Discussed plans with patient for ongoing care management follow up and provided patient with direct contact information for care management team  Patient Self Care Activities:  . Currently UNABLE TO independently perform iADl's or ADl's safely without assistance  Please see past updates related to this goal by clicking on the "Past Updates" button in the selected goal          Materials Provided: Verbal education about VA benefits provided by phone  Follow Up Plan: SW will follow up with patient by phone over the next 4 weeks   [name], BSW, CDP Social Worker, Certified Dementia Practitioner [place] [facility] [place] [facility] [phone]

## 2018-11-29 NOTE — Chronic Care Management (AMB) (Signed)
  Chronic Care Management   Social Work Follow Up Note  11/29/2018 Name: [name] MRN: [id number] DOB: [birth year]  [name] is a 73 y.o. year old male who is a primary care patient of [name], [place]. The CCM team was consulted for assistance with [employer] and Caregiver Stress.   Review of patient status, including review of consultants reports, other relevant assessments, and collaboration with appropriate care team members and the patient's provider was performed as part of comprehensive patient evaluation and provision of chronic care management services.     Goals Addressed            This Visit's Progress   . "I need more help"       Spouse stated Current Barriers:  . Limited social support . Level of care concerns . Family and relationship dysfunction . Memory Deficits . Inability to perform IADL's independently  Clinical Social Work Clinical Goal(s):  [name] Kitchen Over the next 60 days, client will work with SW to address concerns related to level of care concerns . 11/29/18- Over the next 15 days patient's wife will contact a VA Benefits Specialist to obtain assistance with next steps for patient to receive aide and attendance  CCM SW Interventions: Completed 11/29/18 with [name]  Inbound call from the patients spouse who reports receiving forms in the mail from the [place] for her to complete regarding patients aide and attendance application   "I want to make an appointment with you so you can look at these and explain to me how to fill them out"  Explained to Eugene [name] CCM SW is still currently working from home and not currently seeing patients face to face  Advised [name] of the importance in contacting a VA Benefits Specialist who is more knowledgeable of the application process and can provide more help with specific VA benefit questions  Attempted to provide [name] with the contact number to the local [facility] specialist office    Informed by [name] she has a contact number that was provided with the form. [name] further reports her son, [name] plans to assist with form completion during his next visit to the patients home  Encouraged Eugene. Warren to contact the [name] representative in order to obtain information regarding paperwork as well as to request the patient be assigned a VA caseworker who can be her main point of contact regarding future VA benefits questions  Patient Self Care Activities:  . Currently UNABLE TO independently perform iADl's or ADl's safely without assistance  Please see past updates related to this goal by clicking on the "Past Updates" button in the selected goal          Follow Up Plan: SW will outreach the patients spouse [name] over the next 3-4 weeks.   [name], [name], CDP Social Worker, Certified Dementia Practitioner [place] [facility] [place] [facility] [phone]  Total time spent performing care coordination and/or care management activities with the patient by phone or face to face = 15 minutes.

## 2018-12-18 ENCOUNTER — Telehealth: Payer: Self-pay

## 2018-12-19 ENCOUNTER — Ambulatory Visit (INDEPENDENT_AMBULATORY_CARE_PROVIDER_SITE_OTHER): Payer: Medicare Other

## 2018-12-19 DIAGNOSIS — I10 Essential (primary) hypertension: Secondary | ICD-10-CM | POA: Diagnosis not present

## 2018-12-19 DIAGNOSIS — R7303 Prediabetes: Secondary | ICD-10-CM

## 2018-12-19 DIAGNOSIS — F0391 Unspecified dementia with behavioral disturbance: Secondary | ICD-10-CM

## 2018-12-19 NOTE — Chronic Care Management (AMB) (Signed)
  Chronic Care Management   Outreach Note  12/19/2018 Name: [name] MRN: [id number] DOB: [birth year]  Referred by: [name], FNP Reason for referral : Chronic Care Management (CCM RNCM Telephone Follow up )   An unsuccessful telephone outreach was attempted today. The patient was referred to the case management team by [name] FNP for assistance with chronic care management and care coordination.    Follow Up Plan: The care management team will reach out to the patient again over the next 7-10 days.      Barb Merino, RN, BSN, CCM Care Management Coordinator [place] [facility]  Direct Phone: [phone]

## 2018-12-25 ENCOUNTER — Ambulatory Visit: Payer: Self-pay

## 2018-12-25 ENCOUNTER — Telehealth: Payer: Self-pay

## 2018-12-25 DIAGNOSIS — F0391 Unspecified dementia with behavioral disturbance: Secondary | ICD-10-CM

## 2018-12-25 DIAGNOSIS — I10 Essential (primary) hypertension: Secondary | ICD-10-CM

## 2018-12-25 NOTE — Chronic Care Management (AMB) (Signed)
  Chronic Care Management   Social Work Follow Up Note  12/25/2018 Name: [name] MRN: [id number] DOB: 11-20-1945  [name] is a 73 y.o. year old male who is a primary care patient of [name], [place]. The CCM team was consulted for assistance with [employer].   Unsuccessful outbound call to assess progression of patient stated goals. CCM SW left a HIPAA compliant voice message requesting a return call.  Follow Up Plan: SW will follow up with patient by phone over the next 10 days.   [name], BSW, CDP Social Worker, Certified Dementia Practitioner [place] [facility] [place] [facility] [phone]  Total time spent performing care coordination and/or care management activities with the patient by phone or face to face = 5 minutes.

## 2018-12-26 ENCOUNTER — Telehealth: Payer: Self-pay

## 2019-01-02 ENCOUNTER — Telehealth: Payer: Self-pay

## 2019-01-03 ENCOUNTER — Telehealth: Payer: Self-pay

## 2019-01-03 ENCOUNTER — Ambulatory Visit: Payer: Self-pay

## 2019-01-03 DIAGNOSIS — F0391 Unspecified dementia with behavioral disturbance: Secondary | ICD-10-CM

## 2019-01-03 NOTE — Chronic Care Management (AMB) (Signed)
  Chronic Care Management   Social Work Note  01/03/2019 Name: [name] MRN: [id number] DOB: June 02, 1945  CCM SW placed a second outbound call to the patients wife to follow up on progression of patient SW goals. CCM SW left a HIPAA compliant voice message requesting a return call.  Follow Up Plan: SW will follow up with patient by phone over the next two weeks.  [name], BSW, CDP Social Worker, Certified Dementia Practitioner [place] [facility] [place] [facility] [phone]

## 2019-01-15 ENCOUNTER — Ambulatory Visit: Payer: Medicare Other

## 2019-01-15 DIAGNOSIS — F0391 Unspecified dementia with behavioral disturbance: Secondary | ICD-10-CM

## 2019-01-15 DIAGNOSIS — I10 Essential (primary) hypertension: Secondary | ICD-10-CM

## 2019-01-15 NOTE — Patient Instructions (Signed)
Social Worker Visit Information  Goals we discussed today:  Goals Addressed            This Visit's Progress   . "I need more help"   Not on track    Spouse stated Current Barriers:  . Limited social support . Level of care concerns . Family and relationship dysfunction . Memory Deficits . Inability to perform IADL's independently  Clinical Social Work Clinical Goal(s):  [name] Over the next 60 days, client will work with SW to address concerns related to level of care concerns . 11/29/18- Over the next 15 days patient's wife will contact a VA Benefits Specialist to obtain assistance with next steps for patient to receive aide and attendance Not Met  . 01/15/2019- Over the next 60 days, the patients spouse will work with CCM SW to explore resources to assist with patient care needs  CCM SW Interventions: Completed 01/15/19 with [place] call to the patients wife, Krishawn Vanderweele to assess progression of patient goal  Determined Mrs. Sizelove has yet to complete [employer] and Attendance application on behalf of the patient. Mrs. Lefeber does report the patient will begin receiving mobile meals "next week"  Assessed for possibility of patients son, Elonda Husky to assist with completion. Mrs. Cavitt reports she is not speaking with Elonda Husky right now and can not rely on him for assistance  Collaboration with CCM RN Case Manager Glenard Haring [place] to discuss goal progression and possible solutions for application completion  Scheduled outbound call to Mrs. Mcwhirter in the next two days to discuss opportunity for home health SW orders to assist with completion  Patient Self Care Activities:  . Currently UNABLE TO independently perform iADl's or ADl's safely without assistance  Please see past updates related to this goal by clicking on the "Past Updates" button in the selected goal         Follow Up Plan: SW will follow up with patient by phone over the next two days.   [name], BSW,  CDP Social Worker, Certified Dementia Practitioner [place] [facility] [place] [facility] [phone]

## 2019-01-15 NOTE — Chronic Care Management (AMB) (Signed)
  Chronic Care Management   Social Work Follow Up Note  01/15/2019 Name: [name] MRN: [id number] DOB: 1946-03-09  [name] is a 73 y.o. year old male who is a primary care patient of [name], [name]. The CCM team was consulted for assistance with [employer] .   Review of patient status, including review of consultants reports, other relevant assessments, and collaboration with appropriate care team members and the patient's provider was performed as part of comprehensive patient evaluation and provision of chronic care management services.     Goals Addressed            This Visit's Progress   . "I need more help"   Not on track    Spouse stated Current Barriers:  . Limited social support . Level of care concerns . Family and relationship dysfunction . Memory Deficits . Inability to perform IADL's independently  Clinical Social Work Clinical Goal(s):  [name] Over the next 60 days, client will work with SW to address concerns related to level of care concerns . 11/29/18- Over the next 15 days patient's wife will contact a VA Benefits Specialist to obtain assistance with next steps for patient to receive aide and attendance Not Met  . 01/15/2019- Over the next 60 days, the patients spouse will work with CCM SW to explore resources to assist with patient care needs  CCM SW Interventions: Completed 01/15/19 with [place] call to the patients wife, [name] to assess progression of patient goal  Determined Mrs. Lamp has yet to complete [name] and Attendance application on behalf of the patient. Mrs. Schneck does report the patient will begin receiving mobile meals "next week"  Assessed for possibility of patients son, [name] to assist with completion. Mrs. Ladnier reports she is not speaking with [name] right now and can not rely on him for assistance  Collaboration with CCM RN Case Manager [name] to discuss goal progression and possible solutions  for application completion  Scheduled outbound call to Mrs. Barrientes in the next two days to discuss opportunity for home health SW orders to assist with completion  Patient Self Care Activities:  . Currently UNABLE TO independently perform iADl's or ADl's safely without assistance  Please see past updates related to this goal by clicking on the "Past Updates" button in the selected goal          Follow Up Plan: SW will follow up with patient by phone over the next two days.   [name], BSW, CDP Social Worker, Certified Dementia Practitioner [place] [facility] [place] [facility] [phone]  Total time spent performing care coordination and/or care management activities with the patient by phone or face to face = 23 minutes.

## 2019-01-16 ENCOUNTER — Ambulatory Visit: Payer: Medicare Other

## 2019-01-16 DIAGNOSIS — I10 Essential (primary) hypertension: Secondary | ICD-10-CM

## 2019-01-16 DIAGNOSIS — F0391 Unspecified dementia with behavioral disturbance: Secondary | ICD-10-CM

## 2019-01-16 NOTE — Chronic Care Management (AMB) (Signed)
  Chronic Care Management   Social Work Follow Up Note  01/16/2019 Name: [name] MRN: [id number] DOB: 1946-05-25  [name] is a 73 y.o. year old male who is a primary care patient of [name], [place]. The CCM team was consulted for assistance with [employer] .   Review of patient status, including review of consultants reports, other relevant assessments, and collaboration with appropriate care team members and the patient's provider was performed as part of comprehensive patient evaluation and provision of chronic care management services.      Goals Addressed            This Visit's Progress   . "I need more help"       Spouse stated Current Barriers:  . Limited social support . Level of care concerns . Family and relationship dysfunction . Memory Deficits . Inability to perform IADL's independently  Clinical Social Work Clinical Goal(s):  [name] Over the next 60 days, client will work with SW to address concerns related to level of care concerns . 11/29/18- Over the next 15 days patient's wife will contact a VA Benefits Specialist to obtain assistance with next steps for patient to receive aide and attendance Not Met  . 01/15/2019- Over the next 60 days, the patients spouse will work with CCM SW to explore resources to assist with patient care needs  CCM SW Interventions: Completed 01/16/19 with Tonia Ghent  Outbound call to the patients wife, [name] to assess interest in home health SW referral for assistance with aide and attendance application  Determined Mrs. Leib has located necessary forms to complete the application and plans to work on this "tonight"  Encouraged Mrs. Arline to contact CCM SW if referral is desired at a later time  Patient Self Care Activities:  . Currently UNABLE TO independently perform iADl's or ADl's safely without assistance  Please see past updates related to this goal by clicking on the "Past Updates" button in the  selected goal          Follow Up Plan: SW will follow up with patient by phone over the next four weeks.   [name], BSW, CDP Social Worker, Certified Dementia Practitioner [place] [facility] [place] [facility] [phone]  Total time spent performing care coordination and/or care management activities with the patient by phone or face to face = 8 minutes.

## 2019-01-16 NOTE — Patient Instructions (Signed)
Social Worker Visit Information  Goals we discussed today:  Goals Addressed            This Visit's Progress   . "I need more help"       Spouse stated Current Barriers:  . Limited social support . Level of care concerns . Family and relationship dysfunction . Memory Deficits . Inability to perform IADL's independently  Clinical Social Work Clinical Goal(s):  [name] Over the next 60 days, client will work with SW to address concerns related to level of care concerns . 11/29/18- Over the next 15 days patient's wife will contact a VA Benefits Specialist to obtain assistance with next steps for patient to receive aide and attendance Not Met  . 01/15/2019- Over the next 60 days, the patients spouse will work with CCM SW to explore resources to assist with patient care needs  CCM SW Interventions: Completed 01/16/19 with Tonia Ghent  Outbound call to the patients wife, [name] to assess interest in home health SW referral for assistance with aide and attendance application  Determined Mrs. Caba has located necessary forms to complete the application and plans to work on this "tonight"  Encouraged Mrs. Schicker to contact CCM SW if referral is desired at a later time  Patient Self Care Activities:  . Currently UNABLE TO independently perform iADl's or ADl's safely without assistance  Please see past updates related to this goal by clicking on the "Past Updates" button in the selected goal          Materials Provided: No: Patient declined  Follow Up Plan: SW will follow up with patient by phone over the next month   [name], BSW, CDP Social Worker, Certified Dementia Practitioner [place] [facility] [place] [facility] [phone]

## 2019-01-24 ENCOUNTER — Ambulatory Visit: Payer: Self-pay

## 2019-01-24 ENCOUNTER — Telehealth: Payer: Medicare Other

## 2019-01-24 DIAGNOSIS — R7303 Prediabetes: Secondary | ICD-10-CM

## 2019-01-24 DIAGNOSIS — I10 Essential (primary) hypertension: Secondary | ICD-10-CM

## 2019-01-24 DIAGNOSIS — F0391 Unspecified dementia with behavioral disturbance: Secondary | ICD-10-CM

## 2019-01-24 NOTE — Chronic Care Management (AMB) (Signed)
  Chronic Care Management   Outreach Note  01/24/2019 Name: JIHAAD HAGG MRN: [id number] DOB: [birth year]  Referred by: [name], FNP Reason for referral : Chronic Care Management (CCM RNCM Telephone Follow up)   An unsuccessful telephone outreach was attempted today. The patient was referred to the case management team by [name] FNP for assistance with chronic care management and care coordination.   Follow Up Plan: A HIPPA compliant phone message was left for Mrs. Tennenbaum providing contact information and requesting a return call. A Telephone follow up appointment with care management team member scheduled for: 02/15/19  Barb Merino, RN, BSN, CCM Care Management Coordinator [place] [facility]  Direct Phone: [phone]

## 2019-01-25 ENCOUNTER — Ambulatory Visit (INDEPENDENT_AMBULATORY_CARE_PROVIDER_SITE_OTHER): Payer: Medicare Other

## 2019-01-25 DIAGNOSIS — R7303 Prediabetes: Secondary | ICD-10-CM

## 2019-01-25 DIAGNOSIS — F0391 Unspecified dementia with behavioral disturbance: Secondary | ICD-10-CM

## 2019-01-25 DIAGNOSIS — I10 Essential (primary) hypertension: Secondary | ICD-10-CM

## 2019-01-25 NOTE — Chronic Care Management (AMB) (Signed)
Chronic Care Management   Follow Up Note   01/25/2019 Name: [name] MRN: [id number] DOB: [birth year]  Referred by: [name], FNP Reason for referral : Chronic Care Management (CCM RNCM Telephone Follow up)   [name] is a 73 y.o. year old male who is a primary care patient of [name], [place]. The CCM team was consulted for assistance with chronic disease management and care coordination needs.    Review of patient status, including review of consultants reports, relevant laboratory and other test results, and collaboration with appropriate care team members and the patient's provider was performed as part of comprehensive patient evaluation and provision of chronic care management services.    SDOH (Social Determinants of Health) screening performed today: None. See Care Plan for related entries.   I spoke with [name] today regarding [name]'s dementia and the continued need for caregiver assistance.   Outpatient Encounter Medications as of 01/25/2019  Medication Sig  . amLODipine (NORVASC) 5 MG tablet Take 1 tablet (5 mg total) by mouth daily. Patient currently not taking it due to not having anymore  . aspirin EC 81 MG tablet Take 81 mg by mouth daily.  [name] escitalopram (LEXAPRO) 5 MG tablet TAKE 1 TABLET DAILY  . Memantine HCl-Donepezil HCl (NAMZARIC) 28-10 MG CP24 Take 1 capsule by mouth daily.  . pravastatin (PRAVACHOL) 20 MG tablet Take 1 tablet (20 mg total) by mouth daily.  . rivastigmine (EXELON) 4.6 mg/24hr Place 1 patch (4.6 mg total) onto the skin daily.  . Vitamin D, Ergocalciferol, (DRISDOL) 1.25 MG (50000 UT) CAPS capsule Take 1 capsule (50,000 Units total) by mouth every 7 (seven) days.   No facility-administered encounter medications on file as of 01/25/2019.      Goals Addressed    . "I need more help"       Spouse stated Current Barriers:  . Limited social support . Level of care concerns . Family and relationship dysfunction . Memory  Deficits . Inability to perform IADL's independently  Clinical Social Work Clinical Goal(s):  [name] Over the next 60 days, client will work with SW to address concerns related to level of care concerns . 11/29/18- Over the next 15 days patient's wife will contact a VA Benefits Specialist to obtain assistance with next steps for patient to receive aide and attendance Not Met  . 01/15/2019- Over the next 60 days, the patients spouse will work with CCM SW to explore resources to assist with patient care needs  CCM RN CM Interventions 08/28//20 completed call with wife [name]  . Received inbound call from [name] stating she needs assistance with completing the VA packet she received pertaining to [name]'s VA benefits - she is agreeable to receiving in home assistance if available; discussed having a home health LCSW assist 1:1 if this service is available  . Discussed [name] is having some depression and needs assistance with light house keeping and bill paying as well as caregiver respite care . Discussed plans with patient for ongoing care management follow up and provided patient with direct contact information for care management team  Patient Self Care Activities:  . Currently UNABLE TO independently perform iADl's or ADl's safely without assistance  Please see past updates related to this goal by clicking on the "Past Updates" button in the selected goal          Telephone follow up appointment with care management team member scheduled for: 02/15/19   Glenard Haring [place],  RN, BSN, [place] Management Coordinator [place] [facility]  Direct Phone: [phone]

## 2019-01-29 NOTE — Patient Instructions (Signed)
Visit Information  Goals Addressed    . "I need more help"       Spouse stated Current Barriers:  . Limited social support . Level of care concerns . Family and relationship dysfunction . Memory Deficits . Inability to perform IADL's independently  Clinical Social Work Clinical Goal(s):  [name] Over the next 60 days, client will work with SW to address concerns related to level of care concerns . 11/29/18- Over the next 15 days patient's wife will contact a VA Benefits Specialist to obtain assistance with next steps for patient to receive aide and attendance Not Met  . 01/15/2019- Over the next 60 days, the patients spouse will work with CCM SW to explore resources to assist with patient care needs  CCM RN CM Interventions 08/28//20 completed call with wife [name]  . Received inbound call from [name] stating she needs assistance with completing the VA packet she received pertaining to [name]'s VA benefits - she is agreeable to receiving in home assistance if available; discussed having a home health LCSW assist 1:1 if this service is available  . Discussed [name] is having some depression and needs assistance with light house keeping and bill paying as well as caregiver respite care . Discussed plans with patient for ongoing care management follow up and provided patient with direct contact information for care management team  Patient Self Care Activities:  . Currently UNABLE TO independently perform iADl's or ADl's safely without assistance  Please see past updates related to this goal by clicking on the "Past Updates" button in the selected goal         The patient verbalized understanding of instructions provided today and declined a print copy of patient instruction materials.   Telephone follow up appointment with care management team member scheduled for: 02/15/19  Eugene Merino, RN, BSN, CCM Care Management Coordinator [place] [facility]  Direct Phone: [phone]

## 2019-02-11 ENCOUNTER — Ambulatory Visit: Payer: Medicare Other

## 2019-02-11 DIAGNOSIS — F0391 Unspecified dementia with behavioral disturbance: Secondary | ICD-10-CM

## 2019-02-11 DIAGNOSIS — R7303 Prediabetes: Secondary | ICD-10-CM

## 2019-02-11 DIAGNOSIS — I10 Essential (primary) hypertension: Secondary | ICD-10-CM

## 2019-02-11 NOTE — Patient Instructions (Signed)
Social Worker Visit Information  Goals we discussed today:  Goals Addressed            This Visit's Progress     Patient Stated   . COMPLETED: "I need a back up transportation plan" (pt-stated)       Current Barriers:  [name] Memory Deficits - the patient has dementia and does not drive . Limited social support - the patient relies on his wife to provide transportation. The patients wife is battling cancer and concerned the patient may need alternate transportation resources during her treatment.  Clinical Social Work Clinical Goal(s):  [name] Over the next 30  days, client will work with SW to address concerns related to transportation barriers  Interventions:  Confirmed the patients application has been received by Darrell Jewel with [employer]  Telephonic follow up with the patients wife, Geronimo Diliberto, to update on the [employer] application process changes due to COVID 72  Advised Mrs Minotti the patient would receive a letter in the mail in the coming weeks indicating whether he is approved for the [employer] program  Patient Self Care Activities:  . Performs ADL's independently . Currently unable to independently drive self. Patient relies on wife for transportation needs who is currently diagnosed with cancer    Please see past updates related to this goal by clicking on the "Past Updates" button in the selected goal        Other   . "I need more help"       Spouse stated Current Barriers:  . Limited social support . Level of care concerns . Family and relationship dysfunction . Memory Deficits . Inability to perform IADL's independently  Clinical Social Work Clinical Goal(s):  [name] Over the next 60 days, client will work with SW to address concerns related to level of care concerns . 11/29/18- Over the next 15 days patient's wife will contact a VA Benefits Specialist to obtain assistance with next steps for patient to receive aide and attendance Not Met  . 01/15/2019- Over the next 60 days,  the patients spouse will work with CCM SW to explore resources to assist with patient care needs  CCM RN CM Interventions 08/28//20 completed call with wife [name]  . Received inbound call from Mrs. Nickolson stating she needs assistance with completing the VA packet she received pertaining to Mr. Harps's VA benefits - she is agreeable to receiving in home assistance if available; discussed having a home health LCSW assist 1:1 if this service is available  . Discussed Mrs. Rybacki is having some depression and needs assistance with light house keeping and bill paying as well as caregiver respite care . Discussed plans with patient for ongoing care management follow up and provided patient with direct contact information for care management team  CCM SW Interventions Completed 02/11/2019 with Tonia Ghent . Collaboration with Leafy Half with [facility] regarding patient referral for caregiver resources . Informed by Mrs. Scott she would outreach the patients spouse over the next 1-2 days to discuss resource options . Follow up call placed to the patients wife and primary caregiver Deante Blough who reports continued frustration with [name] paperwork . Informed Mrs. Rijos to expect a call from Leafy Half who would disucss multiple resources to assist with caregiver burden as well as paperwork completion . Encouraged Mrs. Wickham to contact SW as needed . Scheduled follow up call over the next two weeks to assess status of patient referral to [employer]  [facility] [place]  Patient Self Care Activities:  . Currently UNABLE TO independently perform iADl's or ADl's safely without assistance  Please see past updates related to this goal by clicking on the "Past Updates" button in the selected goal          Follow Up Plan: SW will follow up with patient by phone over the next two weeks.  [name], BSW, CDP Social Worker, Certified Dementia  Practitioner [place] [facility] [place] [facility] [phone]

## 2019-02-11 NOTE — Chronic Care Management (AMB) (Signed)
Chronic Care Management    Social Work Follow Up Note  02/11/2019 Name: [name] MRN: [id number] DOB: [birth year]  [name] is a 73 y.o. year old male who is a primary care patient of [name], [place]. The CCM team was consulted for assistance with [employer] .   Review of patient status, including review of consultants reports, other relevant assessments, and collaboration with appropriate care team members and the patient's provider was performed as part of comprehensive patient evaluation and provision of chronic care management services.    Outpatient Encounter Medications as of 02/11/2019  Medication Sig  . amLODipine (NORVASC) 5 MG tablet Take 1 tablet (5 mg total) by mouth daily. Patient currently not taking it due to not having anymore  . aspirin EC 81 MG tablet Take 81 mg by mouth daily.  [name] escitalopram (LEXAPRO) 5 MG tablet TAKE 1 TABLET DAILY  . Memantine HCl-Donepezil HCl (NAMZARIC) 28-10 MG CP24 Take 1 capsule by mouth daily.  . pravastatin (PRAVACHOL) 20 MG tablet Take 1 tablet (20 mg total) by mouth daily.  . rivastigmine (EXELON) 4.6 mg/24hr Place 1 patch (4.6 mg total) onto the skin daily.  . Vitamin D, Ergocalciferol, (DRISDOL) 1.25 MG (50000 UT) CAPS capsule Take 1 capsule (50,000 Units total) by mouth every 7 (seven) days.   No facility-administered encounter medications on file as of 02/11/2019.      Goals Addressed            This Visit's Progress     Patient Stated   . COMPLETED: "I need a back up transportation plan" (pt-stated)  Patient no longer safe to independently leave the home. Goal being marked as completed at this time.       Current Barriers:  [name] Memory Deficits - the patient has dementia and does not drive . Limited social support - the patient relies on his wife to provide transportation. The patients wife is battling cancer and concerned the patient may need alternate transportation resources during her treatment.  Clinical  Social Work Clinical Goal(s):  [name] Over the next 30  days, client will work with SW to address concerns related to transportation barriers  Interventions:  Confirmed the patients application has been received by [name] with [employer]  Telephonic follow up with the patients wife, [name], to update on the [employer] application process changes due to COVID 73  Advised [name] the patient would receive a letter in the mail in the coming weeks indicating whether he is approved for the [employer] program  Patient Self Care Activities:  . Performs ADL's independently . Currently unable to independently drive self. Patient relies on wife for transportation needs who is currently diagnosed with cancer    Please see past updates related to this goal by clicking on the "Past Updates" button in the selected goal        Other   . "I need more help"       Spouse stated Current Barriers:  . Limited social support . Level of care concerns . Family and relationship dysfunction . Memory Deficits . Inability to perform IADL's independently  Clinical Social Work Clinical Goal(s):  [name] Over the next 60 days, client will work with SW to address concerns related to level of care concerns . 11/29/18- Over the next 15 days patient's wife will contact a VA Benefits Specialist to obtain assistance with next steps for patient to receive aide and attendance Not Met  . 01/15/2019- Over the  next 60 days, the patients spouse will work with CCM SW to explore resources to assist with patient care needs   CCM SW Interventions Completed 02/11/2019 with [name] . Collaboration with [name] with [facility] regarding patient referral for caregiver resources . Informed by Eugene. Warren she would outreach the patients spouse over the next 1-2 days to discuss resource options . Follow up call placed to the patients wife and primary caregiver [name] who reports continued frustration with  [name] paperwork . Informed [name] to expect a call from [name] who would disucss multiple resources to assist with caregiver burden as well as paperwork completion . Encouraged [name] to contact SW as needed . Scheduled follow up call over the next two weeks to assess status of patient referral to [facility]  Patient Self Care Activities:  . Currently UNABLE TO independently perform iADl's or ADl's safely without assistance  Please see past updates related to this goal by clicking on the "Past Updates" button in the selected goal          Follow Up Plan: SW will follow up with patient by phone over the next two weeks.   [name], BSW, CDP Social Worker, Certified Dementia Practitioner [place] [facility] [facility] [phone]  Total time spent performing care coordination and/or care management activities with the patient by phone or face to face = 15 minutes.

## 2019-02-15 ENCOUNTER — Telehealth: Payer: Self-pay

## 2019-02-18 ENCOUNTER — Telehealth: Payer: Self-pay

## 2019-02-25 ENCOUNTER — Ambulatory Visit (INDEPENDENT_AMBULATORY_CARE_PROVIDER_SITE_OTHER): Payer: Medicare Other

## 2019-02-25 DIAGNOSIS — F0391 Unspecified dementia with behavioral disturbance: Secondary | ICD-10-CM

## 2019-02-25 NOTE — Patient Instructions (Signed)
Social Worker Visit Information  Goals we discussed today:  Goals Addressed            This Visit's Progress   . "I need more help"       Spouse stated Current Barriers:  . Limited social support . Level of care concerns . Family and relationship dysfunction . Memory Deficits . Inability to perform IADL's independently  Clinical Social Work Clinical Goal(s):  [name] Over the next 60 days, client will work with SW to address concerns related to level of care concerns . 11/29/18- Over the next 15 days patient's wife will contact a VA Benefits Specialist to obtain assistance with next steps for patient to receive aide and attendance Not Met  . 01/15/2019- Over the next 60 days, the patients spouse will work with CCM SW to explore resources to assist with patient care needs  CCM SW Interventions Completed with [name] . Outbound call to the patients spouse who confirmed she has been in contact with Mrs. Scott from [employer] "She is going to check on some things and get back to me" . Collaboration via secure e-mail with Mrs Nicki Reaper to inquire follow up plan with the patient. . Scheduled follow up call to assess outcome of referral  Patient Self Care Activities:  . Currently UNABLE TO independently perform iADl's or ADl's safely without assistance  Please see past updates related to this goal by clicking on the "Past Updates" button in the selected goal         Follow Up Plan: SW will follow up with patient by phone over the next two weeks.   [name], BSW, CDP Social Worker, Certified Dementia Practitioner [place] [facility] [place] [facility] [phone]

## 2019-02-25 NOTE — Chronic Care Management (AMB) (Signed)
  Chronic Care Management   Social Work Follow Up Note  02/25/2019 Name: KHALE NIGH MRN: [id number] DOB: 04-Nov-1945  JAC ROMULUS is a 73 y.o. year old male who is a primary care patient of [name], [place]. The CCM team was consulted for assistance with [employer] .   Review of patient status, including review of consultants reports, other relevant assessments, and collaboration with appropriate care team members and the patient's provider was performed as part of comprehensive patient evaluation and provision of chronic care management services.     Outpatient Encounter Medications as of 02/25/2019  Medication Sig  . amLODipine (NORVASC) 5 MG tablet Take 1 tablet (5 mg total) by mouth daily. Patient currently not taking it due to not having anymore  . aspirin EC 81 MG tablet Take 81 mg by mouth daily.  [name] escitalopram (LEXAPRO) 5 MG tablet TAKE 1 TABLET DAILY  . Memantine HCl-Donepezil HCl (NAMZARIC) 28-10 MG CP24 Take 1 capsule by mouth daily.  . pravastatin (PRAVACHOL) 20 MG tablet Take 1 tablet (20 mg total) by mouth daily.  . rivastigmine (EXELON) 4.6 mg/24hr Place 1 patch (4.6 mg total) onto the skin daily.  . Vitamin D, Ergocalciferol, (DRISDOL) 1.25 MG (50000 UT) CAPS capsule Take 1 capsule (50,000 Units total) by mouth every 7 (seven) days.   No facility-administered encounter medications on file as of 02/25/2019.      Goals Addressed            This Visit's Progress   . "I need more help"       Spouse stated Current Barriers:  . Limited social support . Level of care concerns . Family and relationship dysfunction . Memory Deficits . Inability to perform IADL's independently  Clinical Social Work Clinical Goal(s):  [name] Over the next 60 days, client will work with SW to address concerns related to level of care concerns . 11/29/18- Over the next 15 days patient's wife will contact a VA Benefits Specialist to obtain assistance with next steps for patient to  receive aide and attendance Not Met  . 01/15/2019- Over the next 60 days, the patients spouse will work with CCM SW to explore resources to assist with patient care needs  CCM SW Interventions Completed with Tonia Ghent . Outbound call to the patients spouse who confirmed she has been in contact with Mrs. Scott from [employer] "She is going to check on some things and get back to me" . Collaboration via secure e-mail with Mrs Nicki Reaper to inquire follow up plan with the patient. . Scheduled follow up call to assess outcome of referral  Patient Self Care Activities:  . Currently UNABLE TO independently perform iADl's or ADl's safely without assistance  Please see past updates related to this goal by clicking on the "Past Updates" button in the selected goal          Follow Up Plan: SW will follow up with patient by phone over the next two weeks.   [name], BSW, CDP Social Worker, Certified Dementia Practitioner [place] [facility] [place] [facility] [phone]  Total time spent performing care coordination and/or care management activities with the patient by phone or face to face = 5 minutes.

## 2019-03-07 ENCOUNTER — Telehealth: Payer: Self-pay

## 2019-03-07 ENCOUNTER — Ambulatory Visit: Payer: Self-pay

## 2019-03-07 DIAGNOSIS — R7303 Prediabetes: Secondary | ICD-10-CM

## 2019-03-07 DIAGNOSIS — F0391 Unspecified dementia with behavioral disturbance: Secondary | ICD-10-CM

## 2019-03-07 DIAGNOSIS — I10 Essential (primary) hypertension: Secondary | ICD-10-CM

## 2019-03-07 NOTE — Chronic Care Management (AMB) (Signed)
  Chronic Care Management   Outreach Note  03/07/2019 Name: [name] MRN: [id number] DOB: 1945-06-14  Referred by: [name], FNP Reason for referral : Chronic Care Management (CCM RNCM Telephone Follow up)   An unsuccessful telephone outreach was attempted today. The patient was referred to the case management team by [name] FNP for assistance with care management and care coordination.   Follow Up Plan: Telephone follow up appointment with care management team member scheduled for: 03/15/19   Barb Merino, RN, BSN, CCM Care Management Coordinator [place] [facility]  Direct Phone: [phone]

## 2019-03-13 ENCOUNTER — Ambulatory Visit: Payer: Self-pay

## 2019-03-13 ENCOUNTER — Telehealth: Payer: Self-pay

## 2019-03-13 DIAGNOSIS — F0391 Unspecified dementia with behavioral disturbance: Secondary | ICD-10-CM

## 2019-03-13 NOTE — Chronic Care Management (AMB) (Signed)
  Chronic Care Management   Social Work Note  03/13/2019 Name: [name] MRN: [id number] DOB: 1945-11-27  SW placed an unsuccessful outbound call to the patients spouse to assess progression of patient goals. HIPAA compliant voice message left requesting a return call.  Follow Up Plan: SW will follow up with patient by phone over the next two weeks.  [name], BSW, CDP Social Worker, Certified Dementia Practitioner [place] [facility] [place] [facility] [phone]

## 2019-03-15 ENCOUNTER — Ambulatory Visit (INDEPENDENT_AMBULATORY_CARE_PROVIDER_SITE_OTHER): Payer: Medicare Other

## 2019-03-15 ENCOUNTER — Telehealth: Payer: Medicare Other

## 2019-03-15 ENCOUNTER — Other Ambulatory Visit: Payer: Self-pay

## 2019-03-15 DIAGNOSIS — F0391 Unspecified dementia with behavioral disturbance: Secondary | ICD-10-CM

## 2019-03-15 DIAGNOSIS — R7303 Prediabetes: Secondary | ICD-10-CM

## 2019-03-15 DIAGNOSIS — I10 Essential (primary) hypertension: Secondary | ICD-10-CM | POA: Diagnosis not present

## 2019-03-19 NOTE — Patient Instructions (Signed)
Visit Information  Goals Addressed    . "He needs to have his eyes checked and needs to see the dentist"       Current Barriers:  [name] Knowledge Deficit related to appropriate providers for Ophthalmology and Dental exams . Impaired cognitive ability and memory related to Dementia   Nurse Case Manager Clinical Goal(s):  [name] Over the next 120 days, patient will attend all scheduled medical appointments: patient will have new patient appointments scheduled with Ophthalmoloy and Dentist-10/17/18-Goal date revised to 120 days due to delays with getting appointments made due to COVID-19 Goal Not Met . New - 03/15/19- Over the next 90 days, patient will have completed an eye and dental exam  CCM RN CM Interventions:  03/15/19 completed call with wife [name]   . Evaluation of current treatment plan related to dental and eye care needed and patient's adherence to plan as established by provider. . Advised patient to consider allowing Dr. Baird Cancer to send referrals for Eye and Dental Care - wife will consider; Reiterated importance of receiving good oral hygiene to help reduce the risk for Periodontal disease and inflammation that can contribute to coronary artery disease; discussed the importance of patient being able to properly chew his food without the risk for choking; discussed the importance of routine eye exams to help with early detection of certain eye conditions that may lead to blindness and that can occur with age  . Discussed plans with patient for ongoing care management follow up and provided patient with direct contact information for care management team  Patient Self Care Activities:   Wife verbalizes understanding of today's education/instructions provided . Patient is currently UNABLE TO independently schedule and attend MD appointments independently (wife scheduling appts and is providing transportation)  Please see past updates related to this goal by clicking on the "Past Updates"  button in the selected goal       . "I need more help"       Spouse stated Current Barriers:  . Limited social support . Level of care concerns . Family and relationship dysfunction . Memory Deficits . Inability to perform IADL's independently  Clinical Social Work Clinical Goal(s):  [name] Over the next 60 days, client will work with SW to address concerns related to level of care concerns  Goal Met . 11/29/18- Over the next 15 days patient's wife will contact a [place] Benefits Specialist to obtain assistance with next steps for patient to receive aide and attendance Not Met  . New - 01/15/2019- Over the next 60 days, the patients spouse will work with CCM SW to explore resources to assist with patient care needs  CCM RN CM Interventions 03/15/19 completed call with wife [name]  CCM RN CM Interventions:  03/15/19 call completed with wife [name]  . Evaluation of current treatment plan related to long term home plan for caregiver assistance and patient's adherence to plan as established by provider. . Discussed plans with patient for ongoing care management follow up and provided patient with direct contact information for care management team . Discussed Mrs. Lehenbauer has not completed the [place] application concerning Mr. Nobile's VA benefits for caregiver assistance  . Provided Mrs. [name] with the contact information for the [employer], Ricki Miller [phone] . Discussed the representative can assist her telephonically with completing the application . Discussed Mrs. Klopf will consider contacting the [facility] office for assistance with the application   Patient Self Care Activities:  . Currently UNABLE  TO independently perform iADl's or ADl's safely without assistance  Please see past updates related to this goal by clicking on the "Past Updates" button in the selected goal         The patient verbalized understanding of instructions provided today and  declined a print copy of patient instruction materials.   Telephone follow up appointment with care management team member scheduled for: 04/12/19  Barb Merino, RN, BSN, CCM Care Management Coordinator [place] [facility]  Direct Phone: [phone]

## 2019-03-19 NOTE — Chronic Care Management (AMB) (Signed)
Chronic Care Management   Follow Up Note   03/18/2019 Name: [name] MRN: [id number] DOB: 1946-02-21  Referred by: [name], FNP Reason for referral : Chronic Care Management (CCM RNCM Telephone Follow up)   [name] is a 73 y.o. year old male who is a primary care patient of [name], [place]. The CCM team was consulted for assistance with chronic disease management and care coordination needs.    Review of patient status, including review of consultants reports, relevant laboratory and other test results, and collaboration with appropriate care team members and the patient's provider was performed as part of comprehensive patient evaluation and provision of chronic care management services.    SDOH (Social Determinants of Health) screening performed today: None. See Care Plan for related entries.   Advanced Directives Status: N See Care Plan and [place] application for related entries.  I spoke with [name] by telephone today for a CCM follow up.   Outpatient Encounter Medications as of 03/15/2019  Medication Sig  . amLODipine (NORVASC) 5 MG tablet Take 1 tablet (5 mg total) by mouth daily. Patient currently not taking it due to not having anymore  . aspirin EC 81 MG tablet Take 81 mg by mouth daily.  [name] escitalopram (LEXAPRO) 5 MG tablet TAKE 1 TABLET DAILY  . Memantine HCl-Donepezil HCl (NAMZARIC) 28-10 MG CP24 Take 1 capsule by mouth daily.  . pravastatin (PRAVACHOL) 20 MG tablet Take 1 tablet (20 mg total) by mouth daily.  . rivastigmine (EXELON) 4.6 mg/24hr Place 1 patch (4.6 mg total) onto the skin daily.  . Vitamin D, Ergocalciferol, (DRISDOL) 1.25 MG (50000 UT) CAPS capsule Take 1 capsule (50,000 Units total) by mouth every 7 (seven) days.   No facility-administered encounter medications on file as of 03/15/2019.      Goals Addressed    . "He needs to have his eyes checked and needs to see the dentist"       Current Barriers:  [name] Knowledge Deficit  related to appropriate providers for Ophthalmology and Dental exams . Impaired cognitive ability and memory related to Dementia   Nurse Case Manager Clinical Goal(s):  [name] Over the next 120 days, patient will attend all scheduled medical appointments: patient will have new patient appointments scheduled with Ophthalmoloy and Dentist-10/17/18-Goal date revised to 120 days due to delays with getting appointments made due to COVID-19 Goal Not Met . New - 03/15/19- Over the next 90 days, patient will have completed an eye and dental exam  CCM RN CM Interventions:  03/15/19 completed call with wife [name]   . Evaluation of current treatment plan related to dental and eye care needed and patient's adherence to plan as established by provider. . Advised patient to consider allowing Dr. Baird Cancer to send referrals for Eye and Dental Care - wife will consider; Reiterated importance of receiving good oral hygiene to help reduce the risk for Periodontal disease and inflammation that can contribute to coronary artery disease; discussed the importance of patient being able to properly chew his food without the risk for choking; discussed the importance of routine eye exams to help with early detection of certain eye conditions that may lead to blindness and that can occur with age  . Discussed plans with patient for ongoing care management follow up and provided patient with direct contact information for care management team  Patient Self Care Activities:   Wife verbalizes understanding of today's education/instructions provided . Patient is currently UNABLE TO independently schedule  and attend MD appointments independently (wife scheduling appts and is providing transportation)  Please see past updates related to this goal by clicking on the "Past Updates" button in the selected goal       . "I need more help"       Spouse stated Current Barriers:  . Limited social support . Level of care concerns .  Family and relationship dysfunction . Memory Deficits . Inability to perform IADL's independently  Clinical Social Work Clinical Goal(s):  [name] Over the next 60 days, client will work with SW to address concerns related to level of care concerns  Goal Met . 11/29/18- Over the next 15 days patient's wife will contact a [place] Benefits Specialist to obtain assistance with next steps for patient to receive aide and attendance Not Met  . New - 01/15/2019- Over the next 60 days, the patients spouse will work with CCM SW to explore resources to assist with patient care needs  CCM RN CM Interventions 03/15/19 completed call with wife [name]  CCM RN CM Interventions:  03/15/19 call completed with wife [name]  . Evaluation of current treatment plan related to long term home plan for caregiver assistance and patient's adherence to plan as established by provider. . Discussed plans with patient for ongoing care management follow up and provided patient with direct contact information for care management team . Discussed [name] has not completed the [place] application concerning [name]'s VA benefits for caregiver assistance  . Provided [name] with the contact information for the [employer], Ricki Miller [phone] . Discussed the representative can assist her telephonically with completing the application . Discussed [name] will consider contacting the [facility] office for assistance with the application   Patient Self Care Activities:  . Currently UNABLE TO independently perform iADl's or ADl's safely without assistance  Please see past updates related to this goal by clicking on the "Past Updates" button in the selected goal          Telephone follow up appointment with care management team member scheduled for: 04/12/19   Barb Merino, RN, BSN, CCM Care Management Coordinator [place] [facility]  Direct Phone:  [phone]

## 2019-03-26 ENCOUNTER — Telehealth: Payer: Self-pay

## 2019-03-26 ENCOUNTER — Ambulatory Visit: Payer: Self-pay

## 2019-03-26 DIAGNOSIS — I10 Essential (primary) hypertension: Secondary | ICD-10-CM

## 2019-03-26 DIAGNOSIS — F0391 Unspecified dementia with behavioral disturbance: Secondary | ICD-10-CM

## 2019-03-26 NOTE — Chronic Care Management (AMB) (Signed)
  Chronic Care Management   Outreach Note  03/26/2019 Name: TICE MCMEANS MRN: [id number] DOB: December 07, 1945  Referred by: [name], FNP Reason for referral : Care Coordination  SW placed an outreach call to the patient spouse, Robertanthony Sullender, to follow up on progression of patient goals. Mrs. Rabon reports she is currently unable to complete call due to being at the grocery store. Mrs. Tisdel reports she will call SW back at a later time.   Follow Up Plan: SW will follow up with Mrs. Neenan over the next two weeks pending no return call is received.  [name], BSW, CDP Social Worker, Certified Dementia Practitioner [place] [facility] [place] [facility] [phone]

## 2019-04-09 ENCOUNTER — Telehealth: Payer: Self-pay

## 2019-04-11 ENCOUNTER — Ambulatory Visit: Payer: Medicare Other

## 2019-04-11 DIAGNOSIS — F0391 Unspecified dementia with behavioral disturbance: Secondary | ICD-10-CM

## 2019-04-11 NOTE — Chronic Care Management (AMB) (Signed)
  Chronic Care Management   Outreach Note  04/11/2019 Name: [name] MRN: [id number] DOB: 10-Nov-1945  Referred by: [name], FNP Reason for referral : Care Coordination   SW placed a successful outbound call to the patients spouse/caregiver Domanique Luviano to assess progression of patient stated goal. Unfortunately, Mrs. Ghilardi is not feeling well today and unable to complete today's call.   Follow Up Plan: SW will follow up on patient goal progression over the next month.  [name], BSW, CDP Social Worker, Certified Dementia Practitioner [place] [facility] [facility] [phone]

## 2019-04-12 ENCOUNTER — Ambulatory Visit: Payer: Self-pay

## 2019-04-12 ENCOUNTER — Other Ambulatory Visit: Payer: Self-pay | Admitting: Nurse Practitioner

## 2019-04-12 ENCOUNTER — Telehealth: Payer: Self-pay

## 2019-04-12 DIAGNOSIS — I10 Essential (primary) hypertension: Secondary | ICD-10-CM

## 2019-04-12 DIAGNOSIS — F0391 Unspecified dementia with behavioral disturbance: Secondary | ICD-10-CM

## 2019-04-12 NOTE — Chronic Care Management (AMB) (Signed)
  Chronic Care Management   Outreach Note  04/12/2019 Name: [name] MRN: [id number] DOB: 07-08-1945  Referred by: [name], FNP Reason for referral : Chronic Care Management (CCM RNCM Telephone Follow up )   An unsuccessful telephone outreach was attempted today. The patient was referred to the case management team by [name] FNP for assistance with care management and care coordination.   Follow Up Plan: Telephone follow up appointment with care management team member scheduled for: 04/15/19  Barb Merino, RN, BSN, CCM Care Management Coordinator [place] [facility]  Direct Phone: [phone]

## 2019-04-15 ENCOUNTER — Telehealth: Payer: Self-pay

## 2019-04-15 NOTE — Chronic Care Management (AMB) (Addendum)
Chronic Care Management   Follow Up Note   04/12/2019 Name: [name] MRN: [id number] DOB: Jul 02, 1945  Referred by: [name], FNP Reason for referral : Chronic Care Management (CCM RNCM Telephone Follow up )   [name] is a 73 y.o. year old male who is a primary care patient of [name], [place]. The CCM team was consulted for assistance with chronic disease management and care coordination needs.    Review of patient status, including review of consultants reports, relevant laboratory and other test results, and collaboration with appropriate care team members and the patient's provider was performed as part of comprehensive patient evaluation and provision of chronic care management services.    SDOH (Social Determinants of Health) screening performed today: Stress. See Care Plan for related entries.   Inbound return call received from Mrs. [name] for a CCM RN CM update on Mr. Kaigler progression toward meeting his CCM goals.   Outpatient Encounter Medications as of 04/12/2019  Medication Sig  . amLODipine (NORVASC) 5 MG tablet Take 1 tablet (5 mg total) by mouth daily. Patient currently not taking it due to not having anymore  . aspirin EC 81 MG tablet Take 81 mg by mouth daily.  [name] escitalopram (LEXAPRO) 5 MG tablet TAKE 1 TABLET DAILY  . Memantine HCl-Donepezil HCl (NAMZARIC) 28-10 MG CP24 Take 1 capsule by mouth daily.  . pravastatin (PRAVACHOL) 20 MG tablet Take 1 tablet (20 mg total) by mouth daily.  . rivastigmine (EXELON) 4.6 mg/24hr Place 1 patch (4.6 mg total) onto the skin daily.  . Vitamin D, Ergocalciferol, (DRISDOL) 1.25 MG (50000 UT) CAPS capsule TAKE 1 CAPSULE TWICE A WEEK   No facility-administered encounter medications on file as of 04/12/2019.      Goals Addressed    . "He needs to have his eyes checked and needs to see the dentist"       Current Barriers:  [name] Knowledge Deficit related to appropriate providers for Ophthalmology and Dental exams  . Impaired cognitive ability and memory related to Dementia   Nurse Case Manager Clinical Goal(s):  [name] Over the next 120 days, patient will attend all scheduled medical appointments: patient will have new patient appointments scheduled with Ophthalmoloy and Dentist-10/17/18-Goal date revised to 120 days due to delays with getting appointments made due to COVID-19 Goal Not Met . New - 03/15/19- Over the next 90 days, patient will have completed an eye and dental exam  CCM RN CM Interventions:  04/12/19 completed call with wife [name]   . Evaluation of current treatment plan related to dental and eye care needed and patient's adherence to plan as established by provider. . Advised patient to consider allowing CCM RN CM collaborate with the [facility] in [place] to obtain the referral and VA eligibility criteria for patient to be able to f/u with the [facility] hospital for Dental and Vision care; Mrs. Manville is agreeable and would like further assistance with learning more about this process  . Discussed plans with patient for ongoing care management follow up and provided patient with direct contact information for care management team  Patient Self Care Activities:   Wife verbalizes understanding of today's education/instructions provided . Patient is currently UNABLE TO independently schedule and attend MD appointments independently (wife scheduling appts and is providing transportation)  Please see past updates related to this goal by clicking on the "Past Updates" button in the selected goal       . "I need more help"  Spouse stated Current Barriers:  . Limited social support . Level of care concerns . Family and relationship dysfunction . Memory Deficits . Inability to perform IADL's independently  Clinical Social Work Clinical Goal(s):  [name] Over the next 60 days, client will work with SW to address concerns related to level of care concerns  Goal Met . 11/29/18- Over the next 15  days patient's wife will contact a [place] Benefits Specialist to obtain assistance with next steps for patient to receive aide and attendance Not Met  . New - 04/12/2019- Over the next 60 days, the patients spouse will work with CCM SW to explore resources to assist with patient care needs  CCM RN CM Interventions:  04/12/19 completed call with wife Krrish Freund  . Evaluation of completion of VA application to initiate VA benefits for patient and spouse . Discussed Mrs. Brazzel has not completed the [place] application nor has she connected with the in home SW for assistance . Discussed she does not have any idea how to properly complete the [place] paperwork . Discussed plans for the CCM RN and Mrs. Brucato to plan a joint call to the [employer] administration in order to ask for assistance with completion of the [place] applications for benefit eligibility . Discussed plans with patient for ongoing care management follow up and provided patient with direct contact information for care management team  Patient Self Care Activities:  . Currently UNABLE TO independently perform iADl's or ADl's safely without assistance  Please see past updates related to this goal by clicking on the "Past Updates" button in the selected goal          Telephone follow up appointment with care management team member scheduled for: 04/16/19   Barb Merino, RN, BSN, CCM Care Management Coordinator [place] [facility]  Direct Phone: [phone]

## 2019-04-15 NOTE — Patient Instructions (Addendum)
Visit Information  Goals Addressed    . "He needs to have his eyes checked and needs to see the dentist"       Current Barriers:  [name] Knowledge Deficit related to appropriate providers for Ophthalmology and Dental exams . Impaired cognitive ability and memory related to Dementia   Nurse Case Manager Clinical Goal(s):  [name] Over the next 120 days, patient will attend all scheduled medical appointments: patient will have new patient appointments scheduled with Ophthalmoloy and Dentist-10/17/18-Goal date revised to 120 days due to delays with getting appointments made due to COVID-19 Goal Not Met . New - 03/15/19- Over the next 90 days, patient will have completed an eye and dental exam  CCM RN CM Interventions:  04/12/19 completed call with wife [name]   . Evaluation of current treatment plan related to dental and eye care needed and patient's adherence to plan as established by provider. . Advised patient to consider allowing CCM RN CM collaborate with the [facility] in [place] to obtain the referral and VA eligibility criteria for patient to be able to f/u with the [facility] hospital for Dental and Vision care; Mrs. Lagrand is agreeable and would like further assistance with learning more about this process  . Discussed plans with patient for ongoing care management follow up and provided patient with direct contact information for care management team  Patient Self Care Activities:   Wife verbalizes understanding of today's education/instructions provided . Patient is currently UNABLE TO independently schedule and attend MD appointments independently (wife scheduling appts and is providing transportation)  Please see past updates related to this goal by clicking on the "Past Updates" button in the selected goal       . "I need more help"       Spouse stated Current Barriers:  . Limited social support . Level of care concerns . Family and relationship dysfunction . Memory  Deficits . Inability to perform IADL's independently  Clinical Social Work Clinical Goal(s):  [name] Over the next 60 days, client will work with SW to address concerns related to level of care concerns  Goal Met . 11/29/18- Over the next 15 days patient's wife will contact a [place] Benefits Specialist to obtain assistance with next steps for patient to receive aide and attendance Not Met  . New - 04/12/2019- Over the next 60 days, the patients spouse will work with CCM SW to explore resources to assist with patient care needs  CCM RN CM Interventions:  04/12/19 completed call with wife Kalonji Zurawski  . Evaluation of completion of VA application to initiate VA benefits for patient and spouse . Discussed Mrs. Boeckman has not completed the [place] application nor has she connected with the in home SW for assistance . Discussed she does not have any idea how to properly complete the [place] paperwork . Discussed plans for the CCM RN and Mrs. Gutzmer to plan a joint call to the [employer] administration in order to ask for assistance with completion of the [place] applications for benefit eligibility . Discussed plans with patient for ongoing care management follow up and provided patient with direct contact information for care management team  Patient Self Care Activities:  . Currently UNABLE TO independently perform iADl's or ADl's safely without assistance  Please see past updates related to this goal by clicking on the "Past Updates" button in the selected goal         The patient verbalized understanding of instructions provided today and declined  a print copy of patient instruction materials.   Telephone follow up appointment with care management team member scheduled for: 04/16/19  Barb Merino, RN, BSN, CCM Care Management Coordinator [place] [facility]  Direct Phone: [phone]

## 2019-04-16 ENCOUNTER — Other Ambulatory Visit: Payer: Self-pay

## 2019-04-16 ENCOUNTER — Ambulatory Visit (INDEPENDENT_AMBULATORY_CARE_PROVIDER_SITE_OTHER): Payer: Medicare Other

## 2019-04-16 ENCOUNTER — Telehealth: Payer: Medicare Other

## 2019-04-16 DIAGNOSIS — I10 Essential (primary) hypertension: Secondary | ICD-10-CM

## 2019-04-16 DIAGNOSIS — F0391 Unspecified dementia with behavioral disturbance: Secondary | ICD-10-CM | POA: Diagnosis not present

## 2019-04-16 DIAGNOSIS — R7303 Prediabetes: Secondary | ICD-10-CM

## 2019-04-16 NOTE — Patient Instructions (Signed)
Visit Information  Goals Addressed    . "He needs to have his eyes checked and needs to see the dentist"       Current Barriers:  [name] Knowledge Deficit related to appropriate providers for Ophthalmology and Dental exams . Impaired cognitive ability and memory related to Dementia   Nurse Case Manager Clinical Goal(s):  [name] Over the next 120 days, patient will attend all scheduled medical appointments: patient will have new patient appointments scheduled with Ophthalmoloy and Dentist-10/17/18-Goal date revised to 120 days due to delays with getting appointments made due to COVID-19 Goal Not Met . New - 03/15/19- Over the next 90 days, patient will have completed an eye and dental exam  CCM RN CM Interventions:  04/16/19 Placed outbound call to the [facility] in [place]  . Spoke with [place] for information concerning VA protocol for referrals within the [facility] hospital for Dental and Vision Care . Discussed the [place] patient will have to become established at the [facility] hospital with a [place] primary care physician . Discussed the VA patient/spouse can call the [place] hospital at [phone] to schedule the new patient appointment . Discussed the patient will be referred for Dental and or Eye care within the [facility] hospital via the [place] PCP  04/16/19 completed call with wife [name]   . Evaluation of current treatment plan related to dental and eye care needed and patient's adherence to plan as established by provider. . Advised patient to contact the [facility] in [place] at [phone] to schedule Mr. Kushnir a new patient appointment with a VA primary care physician; explained this will be required in order for him to get referred to the [place] and Eye Departments for treatment through the [facility]; explained the patient can continue to use his current PCP provider [name], FNP even though established with the [facility] hospital; Mrs. kemonte ullman understanding and recorded the phone number, she plans to  call for an appointment after Thanksgiving . Discussed plans with patient for ongoing care management follow up and provided patient with direct contact information for care management team  Patient Self Care Activities:   Wife verbalizes understanding of today's education/instructions provided . Patient is currently UNABLE TO independently schedule and attend MD appointments independently (wife scheduling appts and is providing transportation)  Please see past updates related to this goal by clicking on the "Past Updates" button in the selected goal       . "I need more help"       Spouse stated Current Barriers:  . Limited social support . Level of care concerns . Family and relationship dysfunction . Memory Deficits . Inability to perform IADL's independently  Clinical Social Work Clinical Goal(s):  [name] Over the next 60 days, client will work with SW to address concerns related to level of care concerns  Goal Met . 11/29/18- Over the next 15 days patient's wife will contact a [place] Benefits Specialist to obtain assistance with next steps for patient to receive aide and attendance Not Met  . New - 04/12/2019- Over the next 60 days, the patients spouse will work with CCM SW to explore resources to assist with patient care needs  CCM RN CM Interventions:  . 04/16/19 Placed outbound call to the [place] office  . Attempted to reach [place], voice message provided instructions for assistance as needed . Referred to previous e-mail forwarded from [place] to find the [employer] and [employer] .  Message stated the [place] patient can receive assistance with all VA applications by calling [phone] to reach Ricki Miller or [phone] to reach Leisa Lenz or [phone] to reach Georga Bora  . Placed outbound call to Mrs. Sainsbury, provided her with information to explain the procedure in which she will be able to get assistance with  completing the VA application  . Discussed providing Mrs. [name] with the list of necessary documentation needed prior to contacting the [employer] for completion of the [employer] and Benefit application . Discussed and agreed to a call back tomorrow at which time Mrs. Housel will be prepared to record the information needed in order to prepare for the call to assist with completion of the application . Discussed plans with patient for ongoing care management follow up and provided patient with direct contact information for care management team  Patient Self Care Activities:  . Currently UNABLE TO independently perform iADl's or ADl's safely without assistance  Please see past updates related to this goal by clicking on the "Past Updates" button in the selected goal         The patient verbalized understanding of instructions provided today and declined a print copy of patient instruction materials.   Telephone follow up appointment with care management team member scheduled for: 05/17/19  Barb Merino, RN, BSN, CCM Care Management Coordinator [place] [facility]  Direct Phone: [phone]

## 2019-04-16 NOTE — Chronic Care Management (AMB) (Signed)
Chronic Care Management   Follow Up Note   04/16/2019 Name: [name] MRN: [id number] DOB: [birth year]  Referred by: Eugene Brine, FNP Reason for referral : Chronic Care Management (CCM RNCM Telephone Outreach )   [name] is a 73 y.o. year old male who is a primary care patient of [name], [name]. The CCM team was consulted for assistance with chronic disease management and care coordination needs.    Review of patient status, including review of consultants reports, relevant laboratory and other test results, and collaboration with appropriate care team members and the patient's provider was performed as part of comprehensive patient evaluation and provision of chronic care management services.    Placed outbound [place] telephone call to patient's spouse [name] to discuss assistance needed for completion of patient's VA benefit application and VA health care establishment for Dental and Eye Care.  Outpatient Encounter Medications as of 04/16/2019  Medication Sig  . amLODipine (NORVASC) 5 MG tablet Take 1 tablet (5 mg total) by mouth daily. Patient currently not taking it due to not having anymore  . aspirin EC 81 MG tablet Take 81 mg by mouth daily.  [name] escitalopram (LEXAPRO) 5 MG tablet TAKE 1 TABLET DAILY  . Memantine HCl-Donepezil HCl (NAMZARIC) 28-10 MG CP24 Take 1 capsule by mouth daily.  . pravastatin (PRAVACHOL) 20 MG tablet Take 1 tablet (20 mg total) by mouth daily.  . rivastigmine (EXELON) 4.6 mg/24hr Place 1 patch (4.6 mg total) onto the skin daily.  . Vitamin D, Ergocalciferol, (DRISDOL) 1.25 MG (50000 UT) CAPS capsule TAKE 1 CAPSULE TWICE A WEEK   No facility-administered encounter medications on file as of 04/16/2019.      Goals Addressed    . "He needs to have his eyes checked and needs to see the dentist"       Current Barriers:  [name] Knowledge Deficit related to appropriate providers for Ophthalmology and Dental exams . Impaired cognitive  ability and memory related to Dementia   Nurse Case Manager Clinical Goal(s):  [name] Over the next 120 days, patient will attend all scheduled medical appointments: patient will have new patient appointments scheduled with Ophthalmoloy and Dentist-10/17/18-Goal date revised to 120 days due to delays with getting appointments made due to COVID-19 Goal Not Met . New - 03/15/19- Over the next 90 days, patient will have completed an eye and dental exam  CCM RN CM Interventions:  04/16/19 Placed outbound call to the [facility] in [place]  . Spoke with [name] for information concerning VA protocol for referrals within the [facility] hospital for Dental and Vision Care . Discussed the [place] patient will have to become established at the [facility] hospital with a [place] primary care physician . Discussed the VA patient/spouse can call the [place] hospital at [phone] to schedule the new patient appointment . Discussed the patient will be referred for Dental and or Eye care within the [facility] hospital via the [place] PCP  04/16/19 completed call with wife [name]   . Evaluation of current treatment plan related to dental and eye care needed and patient's adherence to plan as established by provider. . Advised patient to contact the [facility] in [place] at [phone] to schedule Mr. Warren a new patient appointment with a VA primary care physician; explained this will be required in order for him to get referred to the [place] [facility] for treatment through the [facility]; explained the patient can continue to use his  current PCP provider Eugene Brine, FNP even though established with the [facility] hospital; [name] understanding and recorded the phone number, she plans to call for an appointment after Thanksgiving . Discussed plans with patient for ongoing care management follow up and provided patient with direct contact information for care management team  Patient Self Care Activities:   Wife verbalizes  understanding of today's education/instructions provided . Patient is currently UNABLE TO independently schedule and attend MD appointments independently (wife scheduling appts and is providing transportation)  Please see past updates related to this goal by clicking on the "Past Updates" button in the selected goal       . "I need more help"       Spouse stated Current Barriers:  . Limited social support . Level of care concerns . Family and relationship dysfunction . Memory Deficits . Inability to perform IADL's independently  Clinical Social Work Clinical Goal(s):  [name] Over the next 60 days, client will work with SW to address concerns related to level of care concerns  Goal Met . 11/29/18- Over the next 15 days patient's wife will contact a [place] Benefits Specialist to obtain assistance with next steps for patient to receive aide and attendance Not Met  . New - 04/12/2019- Over the next 60 days, the patients spouse will work with CCM SW to explore resources to assist with patient care needs  CCM RN CM Interventions:  . 04/16/19 Placed outbound call to the [place] office  . Attempted to reach [place], voice message provided instructions for assistance as needed . Referred to previous e-mail forwarded from [place] to find the [employer] and Benefit Checklist . Message stated the [place] patient can receive assistance with all VA applications by calling [phone] to reach [name] or [phone] to reach [name] or [phone] to reach [name]  . Placed outbound call to [name], provided her with information to explain the procedure in which she will be able to get assistance with completing the VA application  . Discussed providing Mrs. [name] with the list of necessary documentation needed prior to contacting the [employer] for completion of the [employer] and Benefit application . Discussed and agreed  to a call back tomorrow at which time [name] will be prepared to record the information needed in order to prepare for the call to assist with completion of the application . Discussed plans with patient for ongoing care management follow up and provided patient with direct contact information for care management team  Patient Self Care Activities:  . Currently UNABLE TO independently perform iADl's or ADl's safely without assistance  Please see past updates related to this goal by clicking on the "Past Updates" button in the selected goal          Telephone follow up appointment with care management team member scheduled for: 05/17/19   Barb Merino, RN, BSN, CCM Care Management Coordinator [place] [facility]  Direct Phone: [phone]

## 2019-04-17 ENCOUNTER — Telehealth: Payer: Medicare Other

## 2019-04-17 ENCOUNTER — Ambulatory Visit: Payer: Self-pay

## 2019-04-17 DIAGNOSIS — I10 Essential (primary) hypertension: Secondary | ICD-10-CM

## 2019-04-17 DIAGNOSIS — R7303 Prediabetes: Secondary | ICD-10-CM

## 2019-04-17 DIAGNOSIS — F0391 Unspecified dementia with behavioral disturbance: Secondary | ICD-10-CM

## 2019-04-18 NOTE — Chronic Care Management (AMB) (Signed)
Chronic Care Management   Follow Up Note   04/17/2019 Name: MAYNOR MWANGI MRN: [id number] DOB: 1945-08-01  Referred by: [name], FNP Reason for referral : Chronic Care Management (CCM RNCM Telephone Follow up )   ROHIN KREJCI is a 73 y.o. year old male who is a primary care patient of [name], [place]. The CCM team was consulted for assistance with chronic disease management and care coordination needs.    Review of patient status, including review of consultants reports, relevant laboratory and other test results, and collaboration with appropriate care team members and the patient's provider was performed as part of comprehensive patient evaluation and provision of chronic care management services.    SDOH (Social Determinants of Health) screening performed today: None. See Care Plan for related entries.   Outbound call placed to the [employer] to determine patient eligibility for VA benefits.  Outpatient Encounter Medications as of 04/17/2019  Medication Sig  . amLODipine (NORVASC) 5 MG tablet Take 1 tablet (5 mg total) by mouth daily. Patient currently not taking it due to not having anymore  . aspirin EC 81 MG tablet Take 81 mg by mouth daily.  [name] escitalopram (LEXAPRO) 5 MG tablet TAKE 1 TABLET DAILY  . Memantine HCl-Donepezil HCl (NAMZARIC) 28-10 MG CP24 Take 1 capsule by mouth daily.  . pravastatin (PRAVACHOL) 20 MG tablet Take 1 tablet (20 mg total) by mouth daily.  . rivastigmine (EXELON) 4.6 mg/24hr Place 1 patch (4.6 mg total) onto the skin daily.  . Vitamin D, Ergocalciferol, (DRISDOL) 1.25 MG (50000 UT) CAPS capsule TAKE 1 CAPSULE TWICE A WEEK   No facility-administered encounter medications on file as of 04/17/2019.      Goals Addressed    . "I need more help"       Spouse stated Current Barriers:  . Limited social support . Level of care concerns . Family and relationship dysfunction . Memory Deficits . Inability to perform IADL's  independently  Clinical Social Work Clinical Goal(s):  [name] Over the next 60 days, client will work with SW to address concerns related to level of care concerns  Goal Met . 11/29/18- Over the next 15 days patient's wife will contact a [place] Benefits Specialist to obtain assistance with next steps for patient to receive aide and attendance Not Met  . New - 04/12/2019- Over the next 60 days, the patients spouse will work with CCM SW to explore resources to assist with patient care needs  CCM RN CM Interventions:  04/17/19 Placed outbound call to the [employer] office  . Placed outbound call to the [place], spoke with Georga Bora who advised of the income based criteria and protocol for initiation of [place] and Aid and Attendance benefits . Established Mr. Urbas will not be eligible for a Pension and or other benefits such as Aid and Attendance if his total family income exceeds $16,000 annually after deducting medical bills, social security premiums and cost of medications . Reviewed and discussed the documents needed if the patient plans to pursue completing the application process for these benefits  04/17/19 Call completed with spouse Rowin Bayron  . Informed Mrs. Mccarley of the information received from the [employer] related to the income criteria and established procedures for applying for [employer] and [employer] and [employer] . Reviewed and discussed that Mr.& Mrs. Wigley annual income exceeds the income criteria allowable for such VA benefits .  Discussed Mrs. Bjelland would like to have an option for respite care when needed, however, she states she is satisfied with how things are going at the present time . Discussed that Mrs. Dugar would consider asking her sons to help as a last resort due to relationship strain among the family . Sent in basket message to embedded BSW [name] with an update regarding Mr.  Mastrianni's ineligibility for VA benefits at this time  . Discussed plans with patient for ongoing care management follow up and provided patient with direct contact information for care management team  Patient Self Care Activities:  . Currently UNABLE TO independently perform iADl's or ADl's safely without assistance  Please see past updates related to this goal by clicking on the "Past Updates" button in the selected goal         Telephone follow up appointment with care management team member scheduled for: 05/20/19   Barb Merino, RN, BSN, CCM Care Management Coordinator [place] [facility]  Direct Phone: [phone]

## 2019-04-18 NOTE — Patient Instructions (Signed)
Visit Information  Goals Addressed    . "I need more help"       Spouse stated Current Barriers:  . Limited social support . Level of care concerns . Family and relationship dysfunction . Memory Deficits . Inability to perform IADL's independently  Clinical Social Work Clinical Goal(s):  [name] Over the next 60 days, client will work with SW to address concerns related to level of care concerns  Goal Met . 11/29/18- Over the next 15 days patient's wife will contact a [place] Benefits Specialist to obtain assistance with next steps for patient to receive aide and attendance Not Met  . New - 04/12/2019- Over the next 60 days, the patients spouse will work with CCM SW to explore resources to assist with patient care needs  CCM RN CM Interventions:  04/17/19 Placed outbound call to the [employer] office  . Placed outbound call to the [place], spoke with Georga Bora who advised of the income based criteria and protocol for initiation of [place] and Aid and Attendance benefits . Established Mr. Crescenzo will not be eligible for a Pension and or other benefits such as Aid and Attendance if his total family income exceeds $16,000 annually after deducting medical bills, social security premiums and cost of medications . Reviewed and discussed the documents needed if the patient plans to pursue completing the application process for these benefits  04/17/19 Call completed with spouse Quartez Lagos  . Informed Mrs. Blackburn of the information received from the [employer] related to the income criteria and established procedures for applying for [employer] and [employer] and [employer] . Reviewed and discussed that Mr.& Mrs. Bentz annual income exceeds the income criteria allowable for such VA benefits . Discussed Mrs. Mazurowski would like to have an option for respite care when needed, however, she states she is satisfied with how things are  going at the present time . Discussed that Mrs. Sleeth would consider asking her sons to help as a last resort due to relationship strain among the family . Sent in basket message to embedded BSW [name] with an update regarding Mr. Fuertes's ineligibility for VA benefits at this time  . Discussed plans with patient for ongoing care management follow up and provided patient with direct contact information for care management team  Patient Self Care Activities:  . Currently UNABLE TO independently perform iADl's or ADl's safely without assistance  Please see past updates related to this goal by clicking on the "Past Updates" button in the selected goal         The patient verbalized understanding of instructions provided today and declined a print copy of patient instruction materials.   Telephone follow up appointment with care management team member scheduled for: 05/20/19  Barb Merino, RN, BSN, CCM Care Management Coordinator [place] [facility]  Direct Phone: [phone]

## 2019-05-01 ENCOUNTER — Ambulatory Visit: Payer: Medicare Other

## 2019-05-01 DIAGNOSIS — I10 Essential (primary) hypertension: Secondary | ICD-10-CM

## 2019-05-01 DIAGNOSIS — K029 Dental caries, unspecified: Secondary | ICD-10-CM

## 2019-05-01 DIAGNOSIS — F0391 Unspecified dementia with behavioral disturbance: Secondary | ICD-10-CM

## 2019-05-02 NOTE — Chronic Care Management (AMB) (Signed)
Chronic Care Management   Social Work Follow Up Note  05/02/2019 Name: [name] MRN: [id number] DOB: March 01, 1946  [name] is a 73 y.o. year old male who is a primary care patient of [name], [place]. The CCM team was consulted for assistance with care coordination.   Review of patient status, including review of consultants reports, other relevant assessments, and collaboration with appropriate care team members and the patient's provider was performed as part of comprehensive patient evaluation and provision of chronic care management services.    SW placed an outbound call to the patients caregiver/spouse [name] to follow up on progression of patient goal.  Advanced Directives Status: None on file. Patient has been given documents and received education on steps for completion.  Outpatient Encounter Medications as of 05/01/2019  Medication Sig  . amLODipine (NORVASC) 5 MG tablet Take 1 tablet (5 mg total) by mouth daily. Patient currently not taking it due to not having anymore  . aspirin EC 81 MG tablet Take 81 mg by mouth daily.  [name] escitalopram (LEXAPRO) 5 MG tablet TAKE 1 TABLET DAILY  . Memantine HCl-Donepezil HCl (NAMZARIC) 28-10 MG CP24 Take 1 capsule by mouth daily.  . pravastatin (PRAVACHOL) 20 MG tablet Take 1 tablet (20 mg total) by mouth daily.  . rivastigmine (EXELON) 4.6 mg/24hr Place 1 patch (4.6 mg total) onto the skin daily.  . Vitamin D, Ergocalciferol, (DRISDOL) 1.25 MG (50000 UT) CAPS capsule TAKE 1 CAPSULE TWICE A WEEK   No facility-administered encounter medications on file as of 05/01/2019.      Goals Addressed            This Visit's Progress   . "He needs to have his eyes checked and needs to see the dentist"   Not on track    Current Barriers:  [name] Knowledge Deficit related to appropriate providers for Ophthalmology and Dental exams . Impaired cognitive ability and memory related to Dementia   Nurse Case Manager Clinical Goal(s):  [name]  Over the next 120 days, patient will attend all scheduled medical appointments: patient will have new patient appointments scheduled with Ophthalmoloy and Dentist-10/17/18-Goal date revised to 120 days due to delays with getting appointments made due to COVID-19 Goal Not Met . New - 03/15/19- Over the next 90 days, patient will have completed an eye and dental exam  CCM SW Interventions: Completed 05/01/2019 with wife [name] . Outbound call to patient spouse and caregiver to assess progression of patient goal . Reviewed recent RN Case Manager interventions with [name] indicating the patient plans to establish care at local [facility] to receive dental care . Determined [name] has yet to schedule appointment on behalf of the patient but reports plans to do so after Christmas . Discussed recent in home visit with [facility] representative to complete Medicare open enrollment o "He is going to have dental and vision insurance that will help in January" . Encouraged [name] to arrange dental visit for the patient as soon as possible due to prolonged concern with patient dental hygiene . Collaboration with RN Case Manager to provide update on today's call  Patient Self Care Activities:   Wife verbalizes understanding of today's education/instructions provided . Patient is currently UNABLE TO independently schedule and attend MD appointments independently (wife scheduling appts and is providing transportation)  Please see past updates related to this goal by clicking on the "Past Updates" button in the selected goal       .  Assist with long term care coordination needs by conducting quarterly SDOH screen       Current Barriers:  . On going care coordination challenges due to dementia . Limited social support   Social Work Clinical Goal(s):  [name] Over the next 180 days the patient will work with SW to conduct ongoing SDOH screenings to identify future challenges and care management needs   Interventions: . Outbound call to the patients spouse and primary caregiver [name] . Assessed for current SDOH challenges. None identified at this time . Discussed plan for ongoing SW outreach to assess for challenges related to patients dementia and ability to manage chronic conditions . Collaboration with RN Case Manager to communicate long term follow up plan . Scheduled follow up call to the patient over the next 3 months . Encouraged [name] to contact SW of needs arise prior to the next scheduled call  Patient Self Care Activities:  . Patient verbalizes understanding of plan to receive long-term SW follow up . Unable to independently perform iADL's. Relies on spouse, [name], to assist with daily care needs  Initial goal documentation         Follow Up Plan: SW will follow up with patient by phone over the next 3 months.   [name], BSW, CDP Social Worker, Certified Dementia Practitioner [place] [facility] [place] [facility] [phone]  Total time spent performing care coordination and/or care management activities with the patient by phone or face to face = 12 minutes.

## 2019-05-02 NOTE — Patient Instructions (Signed)
Social Worker Visit Information  Goals we discussed today:  Goals Addressed            This Visit's Progress   . "He needs to have his eyes checked and needs to see the dentist"   Not on track    Current Barriers:  [name] Knowledge Deficit related to appropriate providers for Ophthalmology and Dental exams . Impaired cognitive ability and memory related to Dementia   Nurse Case Manager Clinical Goal(s):  [name] Over the next 120 days, patient will attend all scheduled medical appointments: patient will have new patient appointments scheduled with Ophthalmoloy and Dentist-10/17/18-Goal date revised to 120 days due to delays with getting appointments made due to COVID-19 Goal Not Met . New - 03/15/19- Over the next 90 days, patient will have completed an eye and dental exam  CCM RN CM Interventions:  04/16/19 Placed outbound call to the [facility] in [place]  . Spoke with [name] for information concerning VA protocol for referrals within the [facility] hospital for Dental and Vision Care . Discussed the [name] patient will have to become established at the [facility] hospital with a [place] primary care physician . Discussed the VA patient/spouse can call the [place] hospital at [phone] to schedule the new patient appointment . Discussed the patient will be referred for Dental and or Eye care within the [facility] hospital via the [place] PCP  04/16/19 completed call with wife [name]   . Evaluation of current treatment plan related to dental and eye care needed and patient's adherence to plan as established by provider. . Advised patient to contact the [facility] in [place] at [phone] to schedule [name] a new patient appointment with a [facility] primary care physician; explained this will be required in order for him to get referred to the [place] [facility] for treatment through the [facility]; explained the patient can continue to use his current PCP provider [name], FNP even though established with the  [facility]; Eugene. [name] understanding and recorded the phone number, she plans to call for an appointment after Thanksgiving . Discussed plans with patient for ongoing care management follow up and provided patient with direct contact information for care management team  CCM SW Interventions: Completed 05/01/2019 with wife [name] . Outbound call to patient spouse and caregiver to assess progression of patient goal . Reviewed recent RN Case Manager interventions with [name] indicating the patient plans to establish care at local [name] to receive dental care . Determined [name] has yet to schedule appointment on behalf of the patient but reports plans to do so after Christmas . Discussed recent in home visit with [facility] representative to complete Medicare open enrollment o "He is going to have dental and vision insurance that will help in January" . Encouraged [name] to arrange dental visit for the patient as soon as possible due to prolonged concern with patient dental hygiene . Collaboration with RN Case Manager to provide update on today's call  Patient Self Care Activities:   Wife verbalizes understanding of today's education/instructions provided . Patient is currently UNABLE TO independently schedule and attend MD appointments independently (wife scheduling appts and is providing transportation)  Please see past updates related to this goal by clicking on the "Past Updates" button in the selected goal       . Assist with long term care coordination needs by conducting quarterly SDOH screen       Current Barriers:  .  On going care coordination challenges due to dementia . Limited social support   Social Work Clinical Goal(s):  [name] Over the next 180 days the patient will work with SW to conduct ongoing SDOH screenings to identify future challenges and care management needs  Interventions: . Outbound call to the patients spouse and primary caregiver  [name] . Assessed for current SDOH challenges. None identified at this time . Discussed plan for ongoing SW outreach to assess for challenges related to patients dementia and ability to manage chronic conditions . Collaboration with RN Case Manager to communicate long term follow up plan . Scheduled follow up call to the patient over the next 3 months . Encouraged Eugene Matassa to contact SW of needs arise prior to the next scheduled call  Patient Self Care Activities:  . Patient verbalizes understanding of plan to receive long-term SW follow up . Unable to independently perform iADL's. Relies on spouse, [name], to assist with daily care needs  Initial goal documentation         Follow Up Plan: SW will follow up with patient by phone over the next 3 months.  [name], BSW, CDP Social Worker, Certified Dementia Practitioner [place] [facility] [place] [facility] [phone]

## 2019-05-20 ENCOUNTER — Telehealth: Payer: Self-pay

## 2019-05-21 ENCOUNTER — Ambulatory Visit (INDEPENDENT_AMBULATORY_CARE_PROVIDER_SITE_OTHER): Payer: Medicare Other

## 2019-05-21 DIAGNOSIS — I10 Essential (primary) hypertension: Secondary | ICD-10-CM | POA: Diagnosis not present

## 2019-05-21 DIAGNOSIS — F0391 Unspecified dementia with behavioral disturbance: Secondary | ICD-10-CM

## 2019-05-21 DIAGNOSIS — R7303 Prediabetes: Secondary | ICD-10-CM

## 2019-05-22 NOTE — Patient Instructions (Signed)
Visit Information  Goals Addressed    . "He needs to have his eyes checked and needs to see the dentist"       Current Barriers:  [name] Knowledge Deficit related to appropriate providers for Ophthalmology and Dental exams . Impaired cognitive ability and memory related to Dementia . Chronic Disease Management support and education needs related to Essential Hypertension, Dementia with behavioral disturbance, pre-diabetes   Nurse Case Manager Clinical Goal(s):  [name] Over the next 120 days, patient will attend all scheduled medical appointments: patient will have new patient appointments scheduled with Ophthalmoloy and Dentist-10/17/18-Goal date revised to 120 days due to delays with getting appointments made due to COVID-19 Goal Not Met . New - 03/15/19- Over the next 90 days, patient will have completed an eye and dental exam  CCM RN CM Interventions:  05/21/19 call completed with Mrs. Mcconnell . Evaluation of current treatment plan related to dental and eye care needed and patient's adherence to plan as established by provider . Discussed Mrs. Shawn plans to contact the [facility] in [place] at [phone] to schedule Mr. Melucci a new patient appointment with a [facility] primary care physician; explained this will be required in order for him to get referred to the [place] [facility] for treatment through the [facility] after the first of the year . Discussed she has also completed the Medicare re-enrollment for herself and Mr. Attwood and was provided the new benefits that include dental and vision care . Discussed plans with patient for ongoing care management follow up and provided patient with direct contact information for care management team  CCM SW Interventions: Completed 05/01/2019 with wife Elizjah Noblet . Outbound call to patient spouse and caregiver to assess progression of patient goal . Reviewed recent RN Case Manager interventions with Mrs Shidler indicating the patient plans to establish  care at local [place] [facility] to receive dental care . Determined Mrs Laminack has yet to schedule appointment on behalf of the patient but reports plans to do so after Christmas . Discussed recent in home visit with [facility] representative to complete Medicare open enrollment o "He is going to have dental and vision insurance that will help in January" . Encouraged Mrs Stults to arrange dental visit for the patient as soon as possible due to prolonged concern with patient dental hygiene . Collaboration with RN Case Manager to provide update on today's call  Patient Self Care Activities:   Wife verbalizes understanding of today's education/instructions provided . Patient is currently UNABLE TO independently schedule and attend MD appointments independently (wife scheduling appts and is providing transportation)  Please see past updates related to this goal by clicking on the "Past Updates" button in the selected goal      . "I need more help"       Spouse stated Current Barriers:  . Chronic Disease Management support and education needs related to Essential Hypertension, Dementia with behavioral disturbance, pre-diabetes . Limited social support . Level of care concerns . Family and relationship dysfunction . Memory Deficits . Inability to perform IADL's independently  Clinical Social Work Clinical Goal(s):  [name] Over the next 60 days, client will work with SW to address concerns related to level of care concerns  Goal Met . 11/29/18- Over the next 15 days patient's wife will contact a [place] Benefits Specialist to obtain assistance with next steps for patient to receive aide and attendance Not Met  . New - 04/12/2019- Over the next 60 days, the patients  spouse will work with CCM SW to explore resources to assist with patient care needs  CCM RN CM Interventions:  05/21/19 call completed with wife Kaitlin Ardito . Determined Mrs. Bos continues to be the primary and sole caregiver for Mr.  Harr . Discussed that she would call upon her sons for emergency needs only if they were to arise in such a case that Mrs. Sloan  . Was unable to provide care for Mr. Guizar . Encouraged Mrs. Paszkiewicz to balance her activity with rest and to keep the CCM team well informed of ongoing caregiver burnout and or other caregiver assistance that may arise . Discussed plans with patient for ongoing care management follow up and provided patient with direct contact information for care management team  Patient Self Care Activities:  . Currently UNABLE TO independently perform iADl's or ADl's safely without assistance  Please see past updates related to this goal by clicking on the "Past Updates" button in the selected goal         The patient verbalized understanding of instructions provided today and declined a print copy of patient instruction materials.   Telephone follow up appointment with care management team member scheduled for:07/08/19  Barb Merino, RN, BSN, CCM Care Management Coordinator [place] [facility]  Direct Phone: [phone]

## 2019-05-22 NOTE — Chronic Care Management (AMB) (Signed)
Chronic Care Management   Follow Up Note   05/21/2019 Name: [name] MRN: [id number] DOB: Nov 12, 1945  Referred by: [name], FNP Reason for referral : Chronic Care Management (CCM RNCM Telephone Follow up )   [name] is a 73 y.o. year old male who is a primary care patient of [name], [place]. The CCM team was consulted for assistance with chronic disease management and care coordination needs.    Review of patient status, including review of consultants reports, relevant laboratory and other test results, and collaboration with appropriate care team members and the patient's provider was performed as part of comprehensive patient evaluation and provision of chronic care management services.    SDOH (Social Determinants of Health) screening performed today: None. See Care Plan for related entries.   Placed outbound call to Mrs. [name] for a CCM RN CM update on Mr. Flett and his care plan has been updated.    Outpatient Encounter Medications as of 05/21/2019  Medication Sig  . amLODipine (NORVASC) 5 MG tablet Take 1 tablet (5 mg total) by mouth daily. Patient currently not taking it due to not having anymore  . aspirin EC 81 MG tablet Take 81 mg by mouth daily.  [name] escitalopram (LEXAPRO) 5 MG tablet TAKE 1 TABLET DAILY  . Memantine HCl-Donepezil HCl (NAMZARIC) 28-10 MG CP24 Take 1 capsule by mouth daily.  . pravastatin (PRAVACHOL) 20 MG tablet Take 1 tablet (20 mg total) by mouth daily.  . rivastigmine (EXELON) 4.6 mg/24hr Place 1 patch (4.6 mg total) onto the skin daily.  . Vitamin D, Ergocalciferol, (DRISDOL) 1.25 MG (50000 UT) CAPS capsule TAKE 1 CAPSULE TWICE A WEEK   No facility-administered encounter medications on file as of 05/21/2019.     Goals Addressed    . "He needs to have his eyes checked and needs to see the dentist"       Current Barriers:  [name] Knowledge Deficit related to appropriate providers for Ophthalmology and Dental exams . Impaired cognitive  ability and memory related to Dementia . Chronic Disease Management support and education needs related to Essential Hypertension, Dementia with behavioral disturbance, pre-diabetes   Nurse Case Manager Clinical Goal(s):  [name] Over the next 120 days, patient will attend all scheduled medical appointments: patient will have new patient appointments scheduled with Ophthalmoloy and Dentist-10/17/18-Goal date revised to 120 days due to delays with getting appointments made due to COVID-19 Goal Not Met . New - 03/15/19- Over the next 90 days, patient will have completed an eye and dental exam  CCM RN CM Interventions:  05/21/19 call completed with Mrs. Gorden . Evaluation of current treatment plan related to dental and eye care needed and patient's adherence to plan as established by provider . Discussed Mrs. Saraceno plans to contact the [facility] in [place] at [phone] to schedule Mr. Brach a new patient appointment with a [facility] primary care physician; explained this will be required in order for him to get referred to the [place] [facility] for treatment through the [facility] after the first of the year . Discussed she has also completed the Medicare re-enrollment for herself and Mr. Studnicka and was provided the new benefits that include dental and vision care . Discussed plans with patient for ongoing care management follow up and provided patient with direct contact information for care management team  CCM SW Interventions: Completed 05/01/2019 with wife Nolen Lindamood . Outbound call to patient spouse and caregiver to assess progression of  patient goal . Reviewed recent RN Case Manager interventions with Mrs Prust indicating the patient plans to establish care at local [place] [facility] to receive dental care . Determined Mrs Stofko has yet to schedule appointment on behalf of the patient but reports plans to do so after Christmas . Discussed recent in home visit with [facility] representative to complete  Medicare open enrollment o "He is going to have dental and vision insurance that will help in January" . Encouraged Mrs Edenfield to arrange dental visit for the patient as soon as possible due to prolonged concern with patient dental hygiene . Collaboration with RN Case Manager to provide update on today's call  Patient Self Care Activities:   Wife verbalizes understanding of today's education/instructions provided . Patient is currently UNABLE TO independently schedule and attend MD appointments independently (wife scheduling appts and is providing transportation)  Please see past updates related to this goal by clicking on the "Past Updates" button in the selected goal      . "I need more help"       Spouse stated Current Barriers:  . Chronic Disease Management support and education needs related to Essential Hypertension, Dementia with behavioral disturbance, pre-diabetes . Limited social support . Level of care concerns . Family and relationship dysfunction . Memory Deficits . Inability to perform IADL's independently  Clinical Social Work Clinical Goal(s):  [name] Over the next 60 days, client will work with SW to address concerns related to level of care concerns  Goal Met . 11/29/18- Over the next 15 days patient's wife will contact a [place] Benefits Specialist to obtain assistance with next steps for patient to receive aide and attendance Not Met  . New - 04/12/2019- Over the next 60 days, the patients spouse will work with CCM SW to explore resources to assist with patient care needs  CCM RN CM Interventions:  05/21/19 call completed with wife Zeric Baranowski . Determined Mrs. Byrns continues to be the primary and sole caregiver for Mr. Velasques . Discussed that she would call upon her sons for emergency needs only if they were to arise in such a case that Mrs. Brister  . Was unable to provide care for Mr. Kington . Encouraged Mrs. Louth to balance her activity with rest and to keep the CCM team  well informed of ongoing caregiver burnout and or other caregiver assistance that may arise . Discussed plans with patient for ongoing care management follow up and provided patient with direct contact information for care management team  Patient Self Care Activities:  . Currently UNABLE TO independently perform iADl's or ADl's safely without assistance  Please see past updates related to this goal by clicking on the "Past Updates" button in the selected goal          Telephone follow up appointment with care management team member scheduled for: 07/08/19   Barb Merino, RN, BSN, CCM Care Management Coordinator [place] [facility]  Direct Phone: [phone]

## 2019-06-09 ENCOUNTER — Other Ambulatory Visit: Payer: Self-pay | Admitting: Nurse Practitioner

## 2019-06-09 DIAGNOSIS — I10 Essential (primary) hypertension: Secondary | ICD-10-CM

## 2019-06-25 ENCOUNTER — Ambulatory Visit: Payer: Self-pay

## 2019-06-25 DIAGNOSIS — I10 Essential (primary) hypertension: Secondary | ICD-10-CM

## 2019-06-25 DIAGNOSIS — R7303 Prediabetes: Secondary | ICD-10-CM

## 2019-06-25 DIAGNOSIS — F0391 Unspecified dementia with behavioral disturbance: Secondary | ICD-10-CM

## 2019-06-25 NOTE — Chronic Care Management (AMB) (Signed)
  Chronic Care Management   Outreach Note  06/25/2019 Name: [name] MRN: [id number] DOB: 1945/06/07  Referred by: [name], FNP Reason for referral : Chronic Care Management (CCM RNCM Telephone Follow up)   Received inbound call from wife [name] requesting a return phone call. An unsuccessful telephone outreach was attempted today. The patient was referred to the case management team by [name] FNP for assistance with care management and care coordination.   Follow Up Plan: A HIPPA compliant phone message was left for the patient providing contact information and requesting a return call.  Telephone follow up appointment with care management team member scheduled for: 07/08/19  Barb Merino, RN, BSN, CCM Care Management Coordinator [place] [facility]  Direct Phone: [phone]

## 2019-07-08 ENCOUNTER — Telehealth: Payer: Self-pay

## 2019-07-10 ENCOUNTER — Telehealth: Payer: Self-pay

## 2019-07-11 ENCOUNTER — Other Ambulatory Visit: Payer: Self-pay | Admitting: Nurse Practitioner

## 2019-07-11 DIAGNOSIS — F0391 Unspecified dementia with behavioral disturbance: Secondary | ICD-10-CM

## 2019-07-16 ENCOUNTER — Other Ambulatory Visit: Payer: Self-pay

## 2019-07-16 DIAGNOSIS — I10 Essential (primary) hypertension: Secondary | ICD-10-CM

## 2019-07-16 MED ORDER — AMLODIPINE BESYLATE 5 MG PO TABS
5.0000 mg | ORAL_TABLET | Freq: Every day | ORAL | 0 refills | Status: DC
Start: 2019-07-16 — End: 2020-04-10

## 2019-07-26 ENCOUNTER — Ambulatory Visit: Payer: Self-pay

## 2019-07-26 ENCOUNTER — Telehealth: Payer: Self-pay

## 2019-07-26 DIAGNOSIS — I10 Essential (primary) hypertension: Secondary | ICD-10-CM

## 2019-07-26 DIAGNOSIS — F0391 Unspecified dementia with behavioral disturbance: Secondary | ICD-10-CM

## 2019-07-26 NOTE — Chronic Care Management (AMB) (Signed)
  Chronic Care Management   Outreach Note  07/26/2019 Name: [name] MRN: [id number] DOB: November 01, 1945  Referred by: [name], FNP Reason for referral : Care Coordination   SW placed an unsuccessful outbound call to the patient's caregiver and spouse [name] to assist with care coordination needs. SW left a HIPAA compliant voice message requesting a return call.  Follow Up Plan: The care management team will reach out to the patient again over the next 30 days.   [name], BSW, CDP Social Worker, Certified Dementia Practitioner [place] [facility] [place] [facility] [phone]

## 2019-08-05 ENCOUNTER — Other Ambulatory Visit: Payer: Self-pay | Admitting: Nurse Practitioner

## 2019-08-13 ENCOUNTER — Telehealth: Payer: Self-pay

## 2019-08-13 ENCOUNTER — Ambulatory Visit: Payer: Self-pay

## 2019-08-13 DIAGNOSIS — F0391 Unspecified dementia with behavioral disturbance: Secondary | ICD-10-CM

## 2019-08-13 NOTE — Chronic Care Management (AMB) (Signed)
  Chronic Care Management   Outreach Note  08/13/2019 Name: [name] MRN: [id number] DOB: 1945-10-05  Referred by: [name], FNP Reason for referral : Care Coordination   SW placed an unsuccessful outbound call to the patient's spouse and caregiver to assist with care coordination needs. SW left a HIPAA compliant voice message requesting a return call.  Follow Up Plan: The care management team will reach out to the patient again over the next 14 days.   [name], BSW, CDP Social Worker, Certified Dementia Practitioner [place] [facility] [place] [facility] [phone]

## 2019-08-14 ENCOUNTER — Ambulatory Visit: Payer: Medicare Other

## 2019-08-14 ENCOUNTER — Ambulatory Visit: Payer: Medicare Other | Admitting: Nurse Practitioner

## 2019-08-16 ENCOUNTER — Telehealth: Payer: Self-pay

## 2019-08-22 ENCOUNTER — Telehealth: Payer: Self-pay

## 2019-08-22 ENCOUNTER — Ambulatory Visit: Payer: Self-pay

## 2019-08-22 DIAGNOSIS — F0391 Unspecified dementia with behavioral disturbance: Secondary | ICD-10-CM

## 2019-08-22 NOTE — Chronic Care Management (AMB) (Signed)
  Chronic Care Management   Outreach Note  08/22/2019 Name: [name] MRN: [id number] DOB: May 06, 1946  Referred by: [name], FNP Reason for referral : Care Coordination   SW placed a second unsuccessful outbound call to the patient's wife to assist with care coordination needs. SW left a HIPAA compliant voice message requesting a return call.  Follow Up Plan: The care management team will reach out to the patient again over the next 14 days.   [name], BSW, CDP Social Worker, Certified Dementia Practitioner [place] [facility] [place] [facility] [phone]

## 2019-08-28 ENCOUNTER — Ambulatory Visit: Payer: TRICARE For Life (TFL)

## 2019-08-28 ENCOUNTER — Telehealth: Payer: Self-pay

## 2019-08-28 ENCOUNTER — Ambulatory Visit: Payer: TRICARE For Life (TFL) | Admitting: Nurse Practitioner

## 2019-08-28 NOTE — Telephone Encounter (Signed)
This nurse attempted to call patient in regards to missing our scheduled appointment today. Phone just rang with no voicemail.

## 2019-08-29 ENCOUNTER — Ambulatory Visit: Payer: Self-pay

## 2019-08-29 DIAGNOSIS — F0391 Unspecified dementia with behavioral disturbance: Secondary | ICD-10-CM

## 2019-08-29 NOTE — Chronic Care Management (AMB) (Signed)
Chronic Care Management    Social Work Follow Up Note  08/29/2019 Name: BRODIN GELPI MRN: [id number] DOB: Mar 16, 1946  [name] is a 74 y.o. year old male who is a primary care patient of [name], [place]. The CCM team was consulted for assistance with care coordination.   Review of patient status, including review of consultants reports, other relevant assessments, and collaboration with appropriate care team members and the patient's provider was performed as part of comprehensive patient evaluation and provision of chronic care management services.    SDOH (Social Determinants of Health) assessments performed: Yes, no acute challenges noted at this time.    Outpatient Encounter Medications as of 08/29/2019  Medication Sig  . amLODipine (NORVASC) 5 MG tablet Take 1 tablet (5 mg total) by mouth daily.  [name] aspirin EC 81 MG tablet Take 81 mg by mouth daily.  [name] escitalopram (LEXAPRO) 5 MG tablet TAKE 1 TABLET DAILY  . Memantine HCl-Donepezil HCl (NAMZARIC) 28-10 MG CP24 Take 1 capsule by mouth daily.  . pravastatin (PRAVACHOL) 20 MG tablet TAKE 1 TABLET DAILY  . rivastigmine (EXELON) 4.6 mg/24hr APPLY ONCE DAILY TRANSDERMALLY  . Vitamin D, Ergocalciferol, (DRISDOL) 1.25 MG (50000 UT) CAPS capsule TAKE 1 CAPSULE TWICE A WEEK   No facility-administered encounter medications on file as of 08/29/2019.     Goals Addressed            This Visit's Progress   . COMPLETED: "He needs to have his eyes checked and needs to see the dentist"       Current Barriers:  [name] Knowledge Deficit related to appropriate providers for Ophthalmology and Dental exams . Impaired cognitive ability and memory related to Dementia . Chronic Disease Management support and education needs related to Essential Hypertension, Dementia with behavioral disturbance, pre-diabetes   Nurse Case Manager Clinical Goal(s):  [name] Over the next 120 days, patient will attend all scheduled medical appointments: patient will have  new patient appointments scheduled with Ophthalmoloy and Dentist-10/17/18-Goal date revised to 120 days due to delays with getting appointments made due to COVID-19 Goal Not Met . New - 03/15/19- Over the next 90 days, patient will have completed an eye and dental exam Goal not met  CCM RN CM Interventions:  05/21/19 call completed with [name] . Evaluation of current treatment plan related to dental and eye care needed and patient's adherence to plan as established by provider . Discussed [name] plans to contact the [facility] in [place] at [phone] to schedule [name] a new patient appointment with a [facility] primary [facility]; explained this will be required in order for him to get referred to the [place] [facility] for treatment through the [facility] after the first of the year . Discussed she has also completed the Medicare re-enrollment for herself and Mr. Blume and was provided the new benefits that include dental and vision care . Discussed plans with patient for ongoing care management follow up and provided patient with direct contact information for care management team  CCM SW Interventions: Completed 08/29/2019 with wife [name] . Outbound call to patient spouse and caregiver to assess progression of patient goal . Determined the patient has yet to be seen by the [place] clinic and does not have an upcoming appointment at this time . Reviewed the importance of scheduling this appointment to assist with patient ongoing dental care needs . SW to close goal at this time due to inability to progress  Patient Self Care Activities:   Wife verbalizes understanding of today's education/instructions provided . Patient is currently UNABLE TO independently schedule and attend MD appointments independently (wife scheduling appts and is providing transportation)  Please see past updates related to this goal by clicking on the "Past Updates" button in the selected goal        . Assist with long term care coordination needs by conducting quarterly SDOH screen       Current Barriers:  . On going care coordination challenges due to dementia . Limited social support   Social Work Clinical Goal(s):  [name] Over the next 180 days the patient will work with SW to conduct ongoing SDOH screenings to identify future challenges and care management needs  CCM SW Interventions: Completed 08/29/2019 with Tonia Ghent . Outbound call to the patients spouse and primary caregiver [name] . Assessed for current SDOH challenges. None identified at this time o Determined the patient is still receiving mobile meals and is happy with services . Discussed plan for ongoing SW outreach to assess for challenges related to patients dementia and ability to manage chronic conditions . Collaboration with RN Case Manager to communicate long term follow up plan . Scheduled follow up call to the patient over the next 3 months . Encouraged Mrs Warren to contact SW of needs arise prior to the next scheduled call  Patient Self Care Activities:  . Patient verbalizes understanding of plan to receive long-term SW follow up . Unable to independently perform iADL's. Relies on spouse, [name], to assist with daily care needs  Please see past updates related to this goal by clicking on the "Past Updates" button in the selected goal          Follow Up Plan: SW will follow up with patient by phone over the next three months   [name], BSW, CDP Social Worker, Certified Dementia Practitioner [place] [facility] [place] [facility] [phone]  Total time spent performing care coordination and/or care management activities with the patient by phone or face to face = 9 minutes.

## 2019-08-29 NOTE — Patient Instructions (Signed)
Social Worker Visit Information  Goals we discussed today:  Goals Addressed            This Visit's Progress   . COMPLETED: "He needs to have his eyes checked and needs to see the dentist"       Current Barriers:  [name] Knowledge Deficit related to appropriate providers for Ophthalmology and Dental exams . Impaired cognitive ability and memory related to Dementia . Chronic Disease Management support and education needs related to Essential Hypertension, Dementia with behavioral disturbance, pre-diabetes   Nurse Case Manager Clinical Goal(s):  [name] Over the next 120 days, patient will attend all scheduled medical appointments: patient will have new patient appointments scheduled with Ophthalmoloy and Dentist-10/17/18-Goal date revised to 120 days due to delays with getting appointments made due to COVID-19 Goal Not Met . New - 03/15/19- Over the next 90 days, patient will have completed an eye and dental exam Goal not met  CCM RN CM Interventions:  05/21/19 call completed with Mrs. Dark . Evaluation of current treatment plan related to dental and eye care needed and patient's adherence to plan as established by provider . Discussed Mrs. Wallman plans to contact the [facility] in [place] at [phone] to schedule Mr. Fawcett a new patient appointment with a [facility] primary [facility]; explained this will be required in order for him to get referred to the [place] [facility] for treatment through the [facility] after the first of the year . Discussed she has also completed the Medicare re-enrollment for herself and Mr. Helzer and was provided the new benefits that include dental and vision care . Discussed plans with patient for ongoing care management follow up and provided patient with direct contact information for care management team  CCM SW Interventions: Completed 08/29/2019 with wife Edword Cu . Outbound call to patient spouse and caregiver to assess progression of patient  goal . Determined the patient has yet to be seen by the [place] clinic and does not have an upcoming appointment at this time . Reviewed the importance of scheduling this appointment to assist with patient ongoing dental care needs . SW to close goal at this time due to inability to progress   Patient Self Care Activities:   Wife verbalizes understanding of today's education/instructions provided . Patient is currently UNABLE TO independently schedule and attend MD appointments independently (wife scheduling appts and is providing transportation)  Please see past updates related to this goal by clicking on the "Past Updates" button in the selected goal      . Assist with long term care coordination needs by conducting quarterly SDOH screen       Current Barriers:  . On going care coordination challenges due to dementia . Limited social support   Social Work Clinical Goal(s):  [name] Over the next 180 days the patient will work with SW to conduct ongoing SDOH screenings to identify future challenges and care management needs  CCM SW Interventions: Completed 08/29/2019 with Tonia Ghent . Outbound call to the patients spouse and primary caregiver Draden Cottingham . Assessed for current SDOH challenges. None identified at this time o Determined the patient is still receiving mobile meals and is happy with services . Discussed plan for ongoing SW outreach to assess for challenges related to patients dementia and ability to manage chronic conditions . Collaboration with RN Case Manager to communicate long term follow up plan . Scheduled follow up call to the patient over the next 3 months .  Encouraged Mrs Grattan to contact SW of needs arise prior to the next scheduled call  Patient Self Care Activities:  . Patient verbalizes understanding of plan to receive long-term SW follow up . Unable to independently perform iADL's. Relies on spouse, Traevion Poehler, to assist with daily care needs  Please  see past updates related to this goal by clicking on the "Past Updates" button in the selected goal          Follow Up Plan: SW will follow up with patient by phone over the next three months   [name], BSW, CDP Social Worker, Certified Dementia Practitioner [place] [facility] [place] [facility] [phone]

## 2019-09-10 ENCOUNTER — Telehealth: Payer: Self-pay | Admitting: Nurse Practitioner

## 2019-09-10 NOTE — Telephone Encounter (Signed)
I called both home and mobile number to reschedule AWV & OV.  No answer and no voicemail

## 2019-10-29 ENCOUNTER — Telehealth: Payer: Self-pay

## 2019-11-22 ENCOUNTER — Telehealth: Payer: Self-pay

## 2019-11-22 ENCOUNTER — Ambulatory Visit (INDEPENDENT_AMBULATORY_CARE_PROVIDER_SITE_OTHER): Payer: Medicare Other

## 2019-11-22 ENCOUNTER — Other Ambulatory Visit: Payer: Self-pay

## 2019-11-22 DIAGNOSIS — F0391 Unspecified dementia with behavioral disturbance: Secondary | ICD-10-CM | POA: Diagnosis not present

## 2019-11-22 DIAGNOSIS — R7303 Prediabetes: Secondary | ICD-10-CM

## 2019-11-22 DIAGNOSIS — I10 Essential (primary) hypertension: Secondary | ICD-10-CM

## 2019-11-26 NOTE — Chronic Care Management (AMB) (Signed)
Chronic Care Management   Follow Up Note   11/25/2019 Name: IZEL EISENHARDT MRN: [id number] DOB: May 29, 1946  Referred by: [name], FNP Reason for referral : Chronic Care Management (FU RN CM Call )   MERYL PONDER is a 74 y.o. year old male who is a primary care patient of [name], [place]. The CCM team was consulted for assistance with chronic disease management and care coordination needs.    Review of patient status, including review of consultants reports, relevant laboratory and other test results, and collaboration with appropriate care team members and the patient's provider was performed as part of comprehensive patient evaluation and provision of chronic care management services.    SDOH (Social Determinants of Health) assessments performed: Yes - Caregiver support needed  See Care Plan activities for detailed interventions related to [place])   Placed outbound CCM RN CM care plan follow up call to wife Yitzchak Kothari.    Outpatient Encounter Medications as of 11/22/2019  Medication Sig  . amLODipine (NORVASC) 5 MG tablet Take 1 tablet (5 mg total) by mouth daily.  [name] aspirin EC 81 MG tablet Take 81 mg by mouth daily.  [name] escitalopram (LEXAPRO) 5 MG tablet TAKE 1 TABLET DAILY  . Memantine HCl-Donepezil HCl (NAMZARIC) 28-10 MG CP24 Take 1 capsule by mouth daily.  . pravastatin (PRAVACHOL) 20 MG tablet TAKE 1 TABLET DAILY  . rivastigmine (EXELON) 4.6 mg/24hr APPLY ONCE DAILY TRANSDERMALLY  . Vitamin D, Ergocalciferol, (DRISDOL) 1.25 MG (50000 UT) CAPS capsule TAKE 1 CAPSULE TWICE A WEEK   No facility-administered encounter medications on file as of 11/22/2019.     Objective:  Lab Results  Component Value Date   HGBA1C 5.7 (H) 07/26/2018   Lab Results  Component Value Date   MICROALBUR 30 08/09/2018   LDLCALC 100 (H) 07/26/2018   CREATININE 1.15 07/26/2018   BP Readings from Last 3 Encounters:  08/09/18 132/90    Goals Addressed    . "I need more help"        Spouse stated Current Barriers:  . Chronic Disease Management support and education needs related to Essential Hypertension, Dementia with behavioral disturbance, pre-diabetes . Limited social support . Level of care concerns . Family and relationship dysfunction . Memory Deficits . Inability to perform IADL's independently  Clinical Social Work Clinical Goal(s):  [name] Over the next 60 days, client will work with SW to address concerns related to level of care concerns  Goal Met . 11/29/18- Over the next 15 days patient's wife will contact a [place] Benefits Specialist to obtain assistance with next steps for patient to receive aide and attendance Not Met  . New - 11/25/19 Over the next 60 days, the patients spouse will work with CCM SW to explore resources to assist with patient care needs  CCM RN CM Interventions:  11/25/19 call completed with wife Diogenes Whirley . Determined Mrs.Hansell continues to be the primary and sole caregiver for Mr.Balazs . Determined she feels as though she can no longer take care of him full time and is uncertain about what the future hold for the 2 of them . Discussed Mrs. Clugston feels isolated and feels that Mr. Mcinturff deliberately chooses not to engage in conversation or socialization with her, he does not wish to leave the home and he often refuses to take his medication when she reminds him a dose is due . Determined these circumstances have increased Mrs. Klang's depression and anxiety as well as having financial strain  and the inability to be able to afford to make several home repairs that are needed . Determined her adult sons are not available to assist and they do not offer any support to herself or Mr. Ozaki . Determined Mrs. Caraher has contacted a Cabin crew and has made the decision to list their home for sale which will occur as early as this week . Determined Mrs. Lunn is considering placing Mr. Grassia in an assistive living facility, she would like to speak with the  embedded BSW and would like to seek legal counsel . Provided Mrs. Mcomber the contact information for [employer] and encouraged her to call to schedule a free phone consultation ASAP . Collaborated with embedded BSW [name] regarding Mrs. Conger's concerns today and discussed Tillie Rung will reach out to offer assistance . Determined Mrs. Barney will f/u with PCP regarding her increased depression and anxiety in order to seek treatment, she is currently not receiving any Psychiatric counseling for this condition nor is she taking an anti-depressant . Sent in basket message to PCP provider [name], FNP with an update regarding the CCM RN CM call that was completed with Mrs. Faughnan today . Discussed plans with patient for ongoing care management follow up and provided patient with direct contact information for care management team  Patient Self Care Activities:  . Currently UNABLE TO independently perform iADl's or ADl's safely without assistance  Please see past updates related to this goal by clicking on the "Past Updates" button in the selected goal         Plan:   Telephone follow up appointment with care management team member scheduled for: 01/07/20  Barb Merino, RN, BSN, CCM Care Management Coordinator [place] [facility]  Direct Phone: [phone]

## 2019-11-26 NOTE — Patient Instructions (Signed)
Visit Information  Goals Addressed    . "I need more help"       Spouse stated Current Barriers:  . Chronic Disease Management support and education needs related to Essential Hypertension, Dementia with behavioral disturbance, pre-diabetes . Limited social support . Level of care concerns . Family and relationship dysfunction . Memory Deficits . Inability to perform IADL's independently  Clinical Social Work Clinical Goal(s):  [name] Over the next 60 days, client will work with SW to address concerns related to level of care concerns  Goal Met . 11/29/18- Over the next 15 days patient's wife will contact a [place] Benefits Specialist to obtain assistance with next steps for patient to receive aide and attendance Not Met  . New - 11/25/19 Over the next 60 days, the patients spouse will work with CCM SW to explore resources to assist with patient care needs  CCM RN CM Interventions:  11/25/19 call completed with wife Dayon Witt . Determined Mrs.Kinyon continues to be the primary and sole caregiver for Mr.Woodmansee . Determined she feels as though she can no longer take care of him full time and is uncertain about what the future hold for the 2 of them . Discussed Mrs. Peixoto feels isolated and feels that Mr. Ledet deliberately chooses not to engage in conversation or socialization with her, he does not wish to leave the home and he often refuses to take his medication when she reminds him a dose is due . Determined these circumstances have increased Mrs. Tellado's depression and anxiety as well as having financial strain and the inability to be able to afford to make several home repairs that are needed . Determined her adult sons are not available to assist and they do not offer any support to herself or Mr. Stankus . Determined Mrs. Tenbrink has contacted a Cabin crew and has made the decision to list their home for sale which will occur as early as this week . Determined Mrs. Marchant is considering placing Mr.  Hankerson in an assistive living facility, she would like to speak with the embedded BSW and would like to seek legal counsel . Provided Mrs. Iiams the contact information for [employer] and encouraged her to call to schedule a free phone consultation ASAP . Collaborated with embedded BSW [name] regarding Mrs. Ragle's concerns today and discussed Tillie Rung will reach out to offer assistance . Determined Mrs. Chisolm will f/u with PCP regarding her increased depression and anxiety in order to seek treatment, she is currently not receiving any Psychiatric counseling for this condition nor is she taking an anti-depressant . Sent in basket message to PCP provider [name], FNP with an update regarding the CCM RN CM call that was completed with Mrs. Iglesias today . Discussed plans with patient for ongoing care management follow up and provided patient with direct contact information for care management team  Patient Self Care Activities:  . Currently UNABLE TO independently perform iADl's or ADl's safely without assistance  Please see past updates related to this goal by clicking on the "Past Updates" button in the selected goal         Patient verbalizes understanding of instructions provided today.   Telephone follow up appointment with care management team member scheduled for: 01/07/20  Barb Merino, RN, BSN, CCM Care Management Coordinator [place] [facility]  Direct Phone: [phone]

## 2019-11-28 ENCOUNTER — Telehealth: Payer: Self-pay

## 2019-12-05 ENCOUNTER — Telehealth: Payer: Self-pay

## 2019-12-06 ENCOUNTER — Ambulatory Visit: Payer: Medicare Other

## 2019-12-06 DIAGNOSIS — I10 Essential (primary) hypertension: Secondary | ICD-10-CM

## 2019-12-06 DIAGNOSIS — F0391 Unspecified dementia with behavioral disturbance: Secondary | ICD-10-CM

## 2019-12-06 NOTE — Patient Instructions (Signed)
Social Worker Visit Information  Goals we discussed today:  Goals Addressed            This Visit's Progress   . "I need more help"   On track    Spouse stated Current Barriers:  . Chronic Disease Management support and education needs related to Essential Hypertension, Dementia with behavioral disturbance, pre-diabetes . Limited social support . Level of care concerns . Family and relationship dysfunction . Memory Deficits . Inability to perform IADL's independently  Clinical Social Work Clinical Goal(s):  [name] Over the next 60 days, client will work with SW to address concerns related to level of care concerns  Goal Met . 11/29/18- Over the next 15 days patient's wife will contact a [place] Benefits Specialist to obtain assistance with next steps for patient to receive aide and attendance Not Met  . New - 11/25/19 Over the next 60 days, the patients spouse will work with CCM SW to explore resources to assist with patient care needs  CCM RN CM Interventions:  11/25/19 call completed with wife Broughton Eppinger . Determined Mrs.Ostermiller continues to be the primary and sole caregiver for Mr.Robenson . Determined she feels as though she can no longer take care of him full time and is uncertain about what the future hold for the 2 of them . Discussed Mrs. Calico feels isolated and feels that Mr. Amend deliberately chooses not to engage in conversation or socialization with her, he does not wish to leave the home and he often refuses to take his medication when she reminds him a dose is due . Determined these circumstances have increased Mrs. Emmer's depression and anxiety as well as having financial strain and the inability to be able to afford to make several home repairs that are needed . Determined her adult sons are not available to assist and they do not offer any support to herself or Mr. Moralez . Determined Mrs. Zanetti has contacted a Cabin crew and has made the decision to list their home for sale which  will occur as early as this week . Determined Mrs. Tullier is considering placing Mr. Brawley in an assistive living facility, she would like to speak with the embedded BSW and would like to seek legal counsel . Provided Mrs. Silvio the contact information for [employer] and encouraged her to call to schedule a free phone consultation ASAP . Collaborated with embedded BSW [name] regarding Mrs. Kassing's concerns today and discussed Tillie Rung will reach out to offer assistance . Determined Mrs. Payne will f/u with PCP regarding her increased depression and anxiety in order to seek treatment, she is currently not receiving any Psychiatric counseling for this condition nor is she taking an anti-depressant . Sent in basket message to PCP provider [name], FNP with an update regarding the CCM RN CM call that was completed with Mrs. Norenberg today . Discussed plans with patient for ongoing care management follow up and provided patient with direct contact information for care management team  CCM SW Interventions: Completed 12/06/19 with Tonia Ghent . Successful outbound call placed to the patients spouse to assess progression of patient stated goal . Determined Mrs. Liszewski has yet to contact an Elder Law attorney due to concern she may not know what to discuss . Assisted Mrs. [name] in scheduling a telephonic appointment with Elder Law attorney Brynda Rim Thursday July 15 at 4:00 pm   Patient Self Care Activities:  . Currently UNABLE TO independently perform iADl's or ADl's safely  without assistance  Please see past updates related to this goal by clicking on the "Past Updates" button in the selected goal         Follow Up Plan: Appointment scheduled for SW follow up with client by phone on: July 15.   [name], BSW, CDP Social Worker, Certified Dementia Practitioner [place] / [place] [facility] [phone]

## 2019-12-06 NOTE — Chronic Care Management (AMB) (Signed)
  Chronic Care Management    Social Work Follow Up Note  12/06/2019 Name: SILER MAVIS MRN: [id number] DOB: [birth year]  NATHANAEL KRIST is a 74 y.o. year old male who is a primary care patient of [name], [place]. The CCM team was consulted for assistance with care coordination.   Review of patient status, including review of consultants reports, other relevant assessments, and collaboration with appropriate care team members and the patient's provider was performed as part of comprehensive patient evaluation and provision of chronic care management services.    SDOH (Social Determinants of Health) assessments performed: No    Outpatient Encounter Medications as of 12/06/2019  Medication Sig  . amLODipine (NORVASC) 5 MG tablet Take 1 tablet (5 mg total) by mouth daily.  [name] aspirin EC 81 MG tablet Take 81 mg by mouth daily.  [name] escitalopram (LEXAPRO) 5 MG tablet TAKE 1 TABLET DAILY  . Memantine HCl-Donepezil HCl (NAMZARIC) 28-10 MG CP24 Take 1 capsule by mouth daily.  . pravastatin (PRAVACHOL) 20 MG tablet TAKE 1 TABLET DAILY  . rivastigmine (EXELON) 4.6 mg/24hr APPLY ONCE DAILY TRANSDERMALLY  . Vitamin D, Ergocalciferol, (DRISDOL) 1.25 MG (50000 UT) CAPS capsule TAKE 1 CAPSULE TWICE A WEEK   No facility-administered encounter medications on file as of 12/06/2019.     Goals Addressed            This Visit's Progress   . "I need more help"   On track    Spouse stated Current Barriers:  . Chronic Disease Management support and education needs related to Essential Hypertension, Dementia with behavioral disturbance, pre-diabetes . Limited social support . Level of care concerns . Family and relationship dysfunction . Memory Deficits . Inability to perform IADL's independently  Clinical Social Work Clinical Goal(s):  [name] Over the next 60 days, client will work with SW to address concerns related to level of care concerns  Goal Met . 11/29/18- Over the next 15 days patient's wife will  contact a [place] Benefits Specialist to obtain assistance with next steps for patient to receive aide and attendance Not Met  . New - 11/25/19 Over the next 60 days, the patients spouse will work with CCM SW to explore resources to assist with patient care needs  CCM SW Interventions: Completed 12/06/19 with Tonia Ghent . Successful outbound call placed to the patients spouse to assess progression of patient stated goal . Determined Mrs. Vanrossum has yet to contact an Elder Law attorney due to concern she may not know what to discuss . Assisted Mrs. [name] in scheduling a telephonic appointment with Elder Law attorney Brynda Rim Thursday July 15 at 4:00 pm   Patient Self Care Activities:  . Currently UNABLE TO independently perform iADl's or ADl's safely without assistance  Please see past updates related to this goal by clicking on the "Past Updates" button in the selected goal          Follow Up Plan: Appointment scheduled for SW follow up with client by phone on: July 15.   [name], BSW, CDP Social Worker, Certified Dementia Practitioner [place] [facility] [place] [facility] [phone]  Total time spent performing care coordination and/or care management activities with the patient by phone or face to face = 15 minutes.

## 2019-12-12 ENCOUNTER — Ambulatory Visit (INDEPENDENT_AMBULATORY_CARE_PROVIDER_SITE_OTHER): Payer: Medicare Other

## 2019-12-12 DIAGNOSIS — I10 Essential (primary) hypertension: Secondary | ICD-10-CM | POA: Diagnosis not present

## 2019-12-12 DIAGNOSIS — F0391 Unspecified dementia with behavioral disturbance: Secondary | ICD-10-CM

## 2019-12-12 NOTE — Chronic Care Management (AMB) (Signed)
Chronic Care Management    Social Work Follow Up Note  12/12/2019 Name: ELIE LEPPO MRN: [id number] DOB: 10-22-1945  HAZEM KENNER is a 74 y.o. year old male who is a primary care patient of [name], [place]. The CCM team was consulted for assistance with care coordination.   Review of patient status, including review of consultants reports, other relevant assessments, and collaboration with appropriate care team members and the patient's provider was performed as part of comprehensive patient evaluation and provision of chronic care management services.    SDOH (Social Determinants of Health) assessments performed: No    Outpatient Encounter Medications as of 12/12/2019  Medication Sig  . amLODipine (NORVASC) 5 MG tablet Take 1 tablet (5 mg total) by mouth daily.  [name] aspirin EC 81 MG tablet Take 81 mg by mouth daily.  [name] escitalopram (LEXAPRO) 5 MG tablet TAKE 1 TABLET DAILY  . Memantine HCl-Donepezil HCl (NAMZARIC) 28-10 MG CP24 Take 1 capsule by mouth daily.  . pravastatin (PRAVACHOL) 20 MG tablet TAKE 1 TABLET DAILY  . rivastigmine (EXELON) 4.6 mg/24hr APPLY ONCE DAILY TRANSDERMALLY  . Vitamin D, Ergocalciferol, (DRISDOL) 1.25 MG (50000 UT) CAPS capsule TAKE 1 CAPSULE TWICE A WEEK   No facility-administered encounter medications on file as of 12/12/2019.     Goals Addressed            This Visit's Progress   . "I need more help"       Spouse stated Current Barriers:  . Chronic Disease Management support and education needs related to Essential Hypertension, Dementia with behavioral disturbance, pre-diabetes . Limited social support . Level of care concerns . Family and relationship dysfunction . Memory Deficits . Inability to perform IADL's independently  Clinical Social Work Clinical Goal(s):  [name] Over the next 60 days, client will work with SW to address concerns related to level of care concerns  Goal Met . 11/29/18- Over the next 15 days patient's wife will contact a  [place] Benefits Specialist to obtain assistance with next steps for patient to receive aide and attendance Not Met  . New - 11/25/19 Over the next 60 days, the patients spouse will work with CCM SW to explore resources to assist with patient care needs  CCM RN CM Interventions:  11/25/19 call completed with wife Efraim Vanallen . Determined Mrs.Tonkinson continues to be the primary and sole caregiver for Mr.Passarelli . Determined she feels as though she can no longer take care of him full time and is uncertain about what the future hold for the 2 of them . Discussed Mrs. Knueppel feels isolated and feels that Mr. On deliberately chooses not to engage in conversation or socialization with her, he does not wish to leave the home and he often refuses to take his medication when she reminds him a dose is due . Determined these circumstances have increased Mrs. Bullen's depression and anxiety as well as having financial strain and the inability to be able to afford to make several home repairs that are needed . Determined her adult sons are not available to assist and they do not offer any support to herself or Mr. Perezperez . Determined Mrs. Bauserman has contacted a Cabin crew and has made the decision to list their home for sale which will occur as early as this week . Determined Mrs. Bertoli is considering placing Mr. Wotton in an assistive living facility, she would like to speak with the embedded BSW and would like to seek legal counsel .  Provided Mrs. Wilensky the contact information for [employer] and encouraged her to call to schedule a free phone consultation ASAP . Collaborated with embedded BSW [name] regarding Mrs. Heacock's concerns today and discussed Tillie Rung will reach out to offer assistance . Determined Mrs. Kossman will f/u with PCP regarding her increased depression and anxiety in order to seek treatment, she is currently not receiving any Psychiatric counseling for this condition nor is she taking an  anti-depressant . Sent in basket message to PCP provider [name], FNP with an update regarding the CCM RN CM call that was completed with Mrs. Hindle today . Discussed plans with patient for ongoing care management follow up and provided patient with direct contact information for care management team  CCM SW Interventions: Completed 12/12/19 with Tonia Ghent . Successful outbound call placed to Mrs. [name] to determine outcome of call with Brynda Rim, Elder [place] . Determined Mrs. Jaros did not complete call with Mr. Toman due to recently closing on her home and needing to pack o Mrs. Mixson reports Mr. Omelia Blackwater encouraged her to contact him the week of the 19th . Encouraged Mrs. Betke to contact Mr. Omelia Blackwater as he requested to review estate needs and determine how to protect assets if/when the patient is placed o Mrs. Vacha reports she does not want to place patient at this time and would rather keep him home as long as possible . Scheduled follow up call over the next month  Completed 12/06/19 with Tonia Ghent . Successful outbound call placed to the patients spouse to assess progression of patient stated goal . Determined Mrs. Auld has yet to contact an Elder Law attorney due to concern she may not know what to discuss . Assisted Mrs. [name] in scheduling a telephonic appointment with Elder Law attorney Brynda Rim Thursday July 15 at 4:00 pm   Patient Self Care Activities:  . Currently UNABLE TO independently perform iADl's or ADl's safely without assistance  Please see past updates related to this goal by clicking on the "Past Updates" button in the selected goal      . COMPLETED: Assist with long term care coordination needs by conducting quarterly SDOH screen       Current Barriers:  . On going care coordination challenges due to dementia . Limited social support   Social Work Clinical Goal(s):  [name] Over the next 180 days the patient will work with SW to conduct ongoing  SDOH screenings to identify future challenges and care management needs  CCM SW Interventions: Completed 08/29/2019 with Tonia Ghent . Outbound call to the patients spouse and primary caregiver Raymone Pembroke . Assessed for current SDOH challenges. None identified at this time o Determined the patient is still receiving mobile meals and is happy with services . Discussed plan for ongoing SW outreach to assess for challenges related to patients dementia and ability to manage chronic conditions . Collaboration with RN Case Manager to communicate long term follow up plan . Scheduled follow up call to the patient over the next 3 months . Encouraged Mrs Schimming to contact SW of needs arise prior to the next scheduled call  Patient Self Care Activities:  . Patient verbalizes understanding of plan to receive long-term SW follow up . Unable to independently perform iADL's. Relies on spouse, Juwuan Sedita, to assist with daily care needs  Please see past updates related to this goal by clicking on the "Past Updates" button in the selected goal  Follow Up Plan: SW will follow up with patient by phone over the next month.   [name], BSW, CDP Social Worker, Certified Dementia Practitioner [place] [facility] [place] [facility] [phone]  Total time spent performing care coordination and/or care management activities with the patient by phone or face to face = 12 minutes.

## 2019-12-12 NOTE — Patient Instructions (Signed)
Social Worker Visit Information  Goals we discussed today:  Goals Addressed            This Visit's Progress   . "I need more help"       Spouse stated Current Barriers:  . Chronic Disease Management support and education needs related to Essential Hypertension, Dementia with behavioral disturbance, pre-diabetes . Limited social support . Level of care concerns . Family and relationship dysfunction . Memory Deficits . Inability to perform IADL's independently  Clinical Social Work Clinical Goal(s):  [name] Over the next 60 days, client will work with SW to address concerns related to level of care concerns  Goal Met . 11/29/18- Over the next 15 days patient's wife will contact a [place] Benefits Specialist to obtain assistance with next steps for patient to receive aide and attendance Not Met  . New - 11/25/19 Over the next 60 days, the patients spouse will work with CCM SW to explore resources to assist with patient care needs  CCM RN CM Interventions:  11/25/19 call completed with wife Zaquan Duffner . Determined Mrs.Thilges continues to be the primary and sole caregiver for Mr.Slusher . Determined she feels as though she can no longer take care of him full time and is uncertain about what the future hold for the 2 of them . Discussed Mrs. Zeoli feels isolated and feels that Mr. Toman deliberately chooses not to engage in conversation or socialization with her, he does not wish to leave the home and he often refuses to take his medication when she reminds him a dose is due . Determined these circumstances have increased Mrs. Braithwaite's depression and anxiety as well as having financial strain and the inability to be able to afford to make several home repairs that are needed . Determined her adult sons are not available to assist and they do not offer any support to herself or Mr. Schlotter . Determined Mrs. Ervine has contacted a Cabin crew and has made the decision to list their home for sale which will  occur as early as this week . Determined Mrs. Marinello is considering placing Mr. Markin in an assistive living facility, she would like to speak with the embedded BSW and would like to seek legal counsel . Provided Mrs. Bergen the contact information for [employer] and encouraged her to call to schedule a free phone consultation ASAP . Collaborated with embedded BSW [name] regarding Mrs. Tagle's concerns today and discussed Tillie Rung will reach out to offer assistance . Determined Mrs. Savarese will f/u with PCP regarding her increased depression and anxiety in order to seek treatment, she is currently not receiving any Psychiatric counseling for this condition nor is she taking an anti-depressant . Sent in basket message to PCP provider [name], FNP with an update regarding the CCM RN CM call that was completed with Mrs. Dimperio today . Discussed plans with patient for ongoing care management follow up and provided patient with direct contact information for care management team  CCM SW Interventions: Completed 12/12/19 with Tonia Ghent . Successful outbound call placed to Mrs. [name] to determine outcome of call with Brynda Rim, Elder [place] . Determined Mrs. Kirchgessner did not complete call with Mr. Toman due to recently closing on her home and needing to pack o Mrs. Stankowski reports Mr. Omelia Blackwater encouraged her to contact him the week of the 19th . Encouraged Mrs. Meany to contact Mr. Omelia Blackwater as he requested to review estate needs and determine how to protect  assets if/when the patient is placed o Mrs. Roy reports she does not want to place patient at this time and would rather keep him home as long as possible . Scheduled follow up call over the next month  Completed 12/06/19 with Tonia Ghent . Successful outbound call placed to the patients spouse to assess progression of patient stated goal . Determined Mrs. Ellerby has yet to contact an Elder Law attorney due to concern she may not know what  to discuss . Assisted Mrs. [name] in scheduling a telephonic appointment with Elder Law attorney Brynda Rim Thursday July 15 at 4:00 pm   Patient Self Care Activities:  . Currently UNABLE TO independently perform iADl's or ADl's safely without assistance  Please see past updates related to this goal by clicking on the "Past Updates" button in the selected goal      . COMPLETED: Assist with long term care coordination needs by conducting quarterly SDOH screen       Current Barriers:  . On going care coordination challenges due to dementia . Limited social support   Social Work Clinical Goal(s):  [name] Over the next 180 days the patient will work with SW to conduct ongoing SDOH screenings to identify future challenges and care management needs  CCM SW Interventions: Completed 08/29/2019 with Tonia Ghent . Outbound call to the patients spouse and primary caregiver Daaiel Starlin . Assessed for current SDOH challenges. None identified at this time o Determined the patient is still receiving mobile meals and is happy with services . Discussed plan for ongoing SW outreach to assess for challenges related to patients dementia and ability to manage chronic conditions . Collaboration with RN Case Manager to communicate long term follow up plan . Scheduled follow up call to the patient over the next 3 months . Encouraged Mrs Mcmanaman to contact SW of needs arise prior to the next scheduled call  Patient Self Care Activities:  . Patient verbalizes understanding of plan to receive long-term SW follow up . Unable to independently perform iADL's. Relies on spouse, Turhan Chill, to assist with daily care needs  Please see past updates related to this goal by clicking on the "Past Updates" button in the selected goal         Follow Up Plan: SW will follow up with patient by phone over the next month.   [name], BSW, CDP Social Worker, Certified Dementia Practitioner [place] [facility] [place]  [facility] [phone]

## 2019-12-31 ENCOUNTER — Telehealth: Payer: Medicare Other

## 2019-12-31 ENCOUNTER — Telehealth: Payer: Self-pay

## 2019-12-31 NOTE — Telephone Encounter (Signed)
  Chronic Care Management   Outreach Note  12/31/2019 Name: [name] MRN: [id number] DOB: 1946-01-13  Referred by: [name], FNP Reason for referral : Care Coordination   SW placed an unsuccessful outbound call to the patients spouse and caregiver to follow up on goal progression. SW left a HIPAA compliant voice message requesting a return call.  Follow Up Plan: The care management team will reach out to the patient again over the next 14 days.   [name], BSW, CDP Social Worker, Certified Dementia Practitioner [place] [facility] [place] [facility] [phone]

## 2020-01-07 ENCOUNTER — Telehealth: Payer: Medicare Other

## 2020-01-07 ENCOUNTER — Ambulatory Visit (INDEPENDENT_AMBULATORY_CARE_PROVIDER_SITE_OTHER): Payer: Medicare Other

## 2020-01-07 ENCOUNTER — Other Ambulatory Visit: Payer: Self-pay

## 2020-01-07 DIAGNOSIS — R7303 Prediabetes: Secondary | ICD-10-CM

## 2020-01-07 DIAGNOSIS — I10 Essential (primary) hypertension: Secondary | ICD-10-CM

## 2020-01-07 DIAGNOSIS — F0391 Unspecified dementia with behavioral disturbance: Secondary | ICD-10-CM

## 2020-01-09 ENCOUNTER — Ambulatory Visit: Payer: Medicare Other

## 2020-01-09 DIAGNOSIS — F0391 Unspecified dementia with behavioral disturbance: Secondary | ICD-10-CM

## 2020-01-09 NOTE — Patient Instructions (Signed)
Social Worker Visit Information  Goals we discussed today:  Goals Addressed            This Visit's Progress   . "I need more help"       Spouse stated Current Barriers:  . Chronic Disease Management support and education needs related to Essential Hypertension, Dementia with behavioral disturbance, pre-diabetes . Limited social support . Level of care concerns . Family and relationship dysfunction . Memory Deficits . Inability to perform IADL's independently  Clinical Social Work Clinical Goal(s):  [name] Over the next 60 days, client will work with SW to address concerns related to level of care concerns  Goal Met . 11/29/18- Over the next 15 days patient's wife will contact a [place] Benefits Specialist to obtain assistance with next steps for patient to receive aide and attendance Not Met  . New - 11/25/19 Over the next 60 days, the patients spouse will work with CCM SW to explore resources to assist with patient care needs  CCM RN CM Interventions:  11/25/19 call completed with wife Tadhg Eskew . Determined Mrs.Capistran continues to be the primary and sole caregiver for Mr.Bogue . Determined she feels as though she can no longer take care of him full time and is uncertain about what the future hold for the 2 of them . Discussed Mrs. Maslow feels isolated and feels that Mr. Anastasi deliberately chooses not to engage in conversation or socialization with her, he does not wish to leave the home and he often refuses to take his medication when she reminds him a dose is due . Determined these circumstances have increased Mrs. Tukes's depression and anxiety as well as having financial strain and the inability to be able to afford to make several home repairs that are needed . Determined her adult sons are not available to assist and they do not offer any support to herself or Mr. Darrough . Determined Mrs. Pettway has contacted a Cabin crew and has made the decision to list their home for sale which will occur  as early as this week . Determined Mrs. Streater is considering placing Mr. Poorman in an assistive living facility, she would like to speak with the embedded BSW and would like to seek legal counsel . Provided Mrs. Cohen the contact information for [employer] and encouraged her to call to schedule a free phone consultation ASAP . Collaborated with embedded BSW [name] regarding Mrs. Torok's concerns today and discussed Tillie Rung will reach out to offer assistance . Determined Mrs. Lindholm will f/u with PCP regarding her increased depression and anxiety in order to seek treatment, she is currently not receiving any Psychiatric counseling for this condition nor is she taking an anti-depressant . Sent in basket message to PCP provider [name], FNP with an update regarding the CCM RN CM call that was completed with Mrs. Durden today . Discussed plans with patient for ongoing care management follow up and provided patient with direct contact information for care management team  CCM SW Interventions: Completed 01/09/20 with Tonia Ghent . Successful outbound call placed to Mrs. Scipio to assess goal progression . Determined the patient and his spouse are currently residing in a hotel after selling their home . Discussed placement options such as ALF and Independent Living . Determined placement is not an interest at this time due to small living spaces and amount of furniture the family would have to downsize o Mrs. Sebo reports she continues to have support from her son and  is working with a Cabin crew to locate a home and/or apartment to meet she and the patient needs o Discussed Mrs. Morton plans to remain in the hotel for another 1-2 weeks while touring homes . Encouraged Mrs. Boxley to contact the embedded care management team as needed . Collaboration with Consulting civil engineer and patient primary provider regarding plan to remain in hotel until a new home is located . Scheduled follow up call over the  next three weeks to assess living arrangement   Patient Self Care Activities:  . Currently UNABLE TO independently perform iADl's or ADl's safely without assistance  Please see past updates related to this goal by clicking on the "Past Updates" button in the selected goal          Follow Up Plan: SW will follow up with patient by phone over the next three weeks.   [name], BSW, CDP Social Worker, Certified Dementia Practitioner [place] [facility] [place] [facility] [phone]

## 2020-01-09 NOTE — Chronic Care Management (AMB) (Signed)
Chronic Care Management    Social Work Follow Up Note  01/09/2020 Name: [name] MRN: [id number] DOB: 06-20-1945  [name] is a 74 y.o. year old male who is a primary care patient of [name], [place]. The CCM team was consulted for assistance with care coordination.   Review of patient status, including review of consultants reports, other relevant assessments, and collaboration with appropriate care team members and the patient's provider was performed as part of comprehensive patient evaluation and provision of chronic care management services.    SDOH (Social Determinants of Health) assessments performed: No    Outpatient Encounter Medications as of 01/09/2020  Medication Sig   amLODipine (NORVASC) 5 MG tablet Take 1 tablet (5 mg total) by mouth daily.   aspirin EC 81 MG tablet Take 81 mg by mouth daily.   escitalopram (LEXAPRO) 5 MG tablet TAKE 1 TABLET DAILY   Memantine HCl-Donepezil HCl (NAMZARIC) 28-10 MG CP24 Take 1 capsule by mouth daily.   pravastatin (PRAVACHOL) 20 MG tablet TAKE 1 TABLET DAILY   rivastigmine (EXELON) 4.6 mg/24hr APPLY ONCE DAILY TRANSDERMALLY   Vitamin D, Ergocalciferol, (DRISDOL) 1.25 MG (50000 UT) CAPS capsule TAKE 1 CAPSULE TWICE A WEEK   No facility-administered encounter medications on file as of 01/09/2020.     Goals Addressed            This Visit's Progress    "I need more help"       Spouse stated Current Barriers:   Chronic Disease Management support and education needs related to Essential Hypertension, Dementia with behavioral disturbance, pre-diabetes  Limited social support  Level of care concerns  Family and relationship dysfunction  Memory Deficits  Inability to perform IADL's independently  Clinical Social Work Clinical Goal(s):   Over the next 60 days, client will work with SW to address concerns related to level of care concerns  Goal Met  11/29/18- Over the next 15 days patient's wife will contact a  [facility] Benefits Specialist to obtain assistance with next steps for patient to receive aide and attendance Not Met   New - 11/25/19 Over the next 60 days, the patients spouse will work with CCM SW to explore resources to assist with patient care needs  CCM RN CM Interventions:  11/25/19 call completed with wife [name]  Determined [name] continues to be the primary and sole caregiver for [name]  Determined she feels as though she can no longer take care of him full time and is uncertain about what the future hold for the 2 of them  Discussed [name] feels isolated and feels that [name] deliberately chooses not to engage in conversation or socialization with her, he does not wish to leave the home and he often refuses to take his medication when she reminds him a dose is due  Determined these circumstances have increased [name]'s depression and anxiety as well as having financial strain and the inability to be able to afford to make several home repairs that are needed  Determined her adult sons are not available to assist and they do not offer any support to herself or [name]  Determined [name] has contacted a Cabin crew and has made the decision to list their home for sale which will occur as early as this week  Determined [name] is considering placing [name] in an assistive living facility, she would like to speak with the embedded BSW and would like to seek legal counsel  Provided [name] the contact information for [employer] and encouraged her to call to schedule a free phone consultation ASAP  Collaborated with embedded BSW [name] regarding Mrs. Babel's concerns today and discussed [name] will reach out to offer assistance  Determined [name] will f/u with PCP regarding her increased depression and anxiety in order to seek treatment, she is currently not receiving any Psychiatric counseling for this condition nor is she taking an  anti-depressant  Sent in basket message to PCP provider Eugene Brine, FNP with an update regarding the CCM RN CM call that was completed with [name] today  Discussed plans with patient for ongoing care management follow up and provided patient with direct contact information for care management team  CCM SW Interventions: Completed 01/09/20 with [name]  Successful outbound call placed to [name] to assess goal progression  Determined the patient and his spouse are currently residing in a hotel after selling their home  Discussed placement options such as ALF and Independent Living  Determined placement is not an interest at this time due to small living spaces and amount of furniture the family would have to downsize o [name] reports she continues to have support from her son and is working with a Cabin crew to locate a home and/or apartment to meet she and the patient needs o Discussed Mrs. Rafferty plans to remain in the hotel for another 1-2 weeks while touring homes  Encouraged Mrs. Coralyn Warren to contact the embedded care management team as needed  Collaboration with [place] and patient primary provider regarding plan to remain in hotel until a new home is located  Scheduled follow up call over the next three weeks to assess living arrangement   Patient Self Care Activities:   Currently UNABLE TO independently perform iADl's or ADl's safely without assistance  Please see past updates related to this goal by clicking on the "Past Updates" button in the selected goal          Follow Up Plan: SW will follow up with patient by phone over the next three weeks.   [name], BSW, CDP Social Worker, Certified Dementia Practitioner [place] [facility] [place] [facility] [phone]  Total time spent performing care coordination and/or care management activities with the patient by phone or face to face = 13 minutes.

## 2020-01-10 NOTE — Patient Instructions (Signed)
Visit Information  Goals Addressed    . "we need help with housing"       Wife stated  CARE PLAN ENTRY (see longitudinal plan of care for additional care plan information)  Current Barriers:  [name] Knowledge Deficits related to Community resources needed to assist with Independent housing  . Chronic Disease Management support and education needs related to Essential Hypertension, Severe episode of recurrent major depressive disorder without psychotic features, Renal mass, right  . Film/video editor.  . Risk for caregiver burnout (wife is sole caregiver)  Nurse Case Manager Clinical Goal(s):  [name] Over the next 90 days, patient and caregiver Larkin Morelos will work with the CCM team and PCP to address needs related to [employer] and support to assist with [place] RN CM Interventions:  01/07/20 call completed with wife Carlito Bogert, she requested to keep the call brief Inter-disciplinary care team collaboration (see longitudinal plan of care) . Determined Mr and Mrs. [place] sold their home and are currently living in a [place]; discussed patient's wife is feeling less stress and depressed due to no longer having the financial strain of keeping up their home repairs  . Discussed at this time, patient's son from [place] have traveled to [place] to help them move and get resettled into a new home . Determined patient and wife [name] plan to find a small rental home in the [place] area  . Received notification from PCP, patient/caregiver have decided to pursue Independent housing and would like assistance with the appropriate resources  . Collaborated with embedded BSW [name] [name] regarding Community resources needed to assist with Independent Living  . Discussed plans with patient for ongoing care management follow up and provided patient with direct contact information for care management team  Patient Self Care Activities:  . Self administers medications as  prescribed . Attends all scheduled provider appointments . Calls pharmacy for medication refills . Calls provider office for new concerns or questions  Initial goal documentation       Patient verbalizes understanding of instructions provided today.   Telephone follow up appointment with care management team member scheduled for: 02/20/20  Barb Merino, RN, BSN, CCM Care Management Coordinator [place] [facility]  Direct Phone: [phone]

## 2020-01-10 NOTE — Chronic Care Management (AMB) (Signed)
Chronic Care Management   Follow Up Note   01/07/2020 Name: TRENELL CONCANNON MRN: [id number] DOB: Jul 19, 1945  Referred by: [name], FNP Reason for referral : Chronic Care Management (FU RN CM Call )   IVA POSTEN is a 74 y.o. year old male who is a primary care patient of [name], [place]. The CCM team was consulted for assistance with chronic disease management and care coordination needs.    Review of patient status, including review of consultants reports, relevant laboratory and other test results, and collaboration with appropriate care team members and the patient's provider was performed as part of comprehensive patient evaluation and provision of chronic care management services.    SDOH (Social Determinants of Health) assessments performed: Yes See Care Plan activities for detailed interventions related to [place])   Placed outbound call to wife [name] for a CCM RN CM follow up.     Outpatient Encounter Medications as of 01/07/2020  Medication Sig  . amLODipine (NORVASC) 5 MG tablet Take 1 tablet (5 mg total) by mouth daily.  [name] aspirin EC 81 MG tablet Take 81 mg by mouth daily.  [name] escitalopram (LEXAPRO) 5 MG tablet TAKE 1 TABLET DAILY  . Memantine HCl-Donepezil HCl (NAMZARIC) 28-10 MG CP24 Take 1 capsule by mouth daily.  . pravastatin (PRAVACHOL) 20 MG tablet TAKE 1 TABLET DAILY  . rivastigmine (EXELON) 4.6 mg/24hr APPLY ONCE DAILY TRANSDERMALLY  . Vitamin D, Ergocalciferol, (DRISDOL) 1.25 MG (50000 UT) CAPS capsule TAKE 1 CAPSULE TWICE A WEEK   No facility-administered encounter medications on file as of 01/07/2020.     Objective:  Lab Results  Component Value Date   HGBA1C 5.7 (H) 07/26/2018   Lab Results  Component Value Date   MICROALBUR 30 08/09/2018   LDLCALC 100 (H) 07/26/2018   CREATININE 1.15 07/26/2018   BP Readings from Last 3 Encounters:  08/09/18 132/90    Goals Addressed    . "we need help with housing"       Wife stated  CARE PLAN  ENTRY (see longitudinal plan of care for additional care plan information)  Current Barriers:  [name] Knowledge Deficits related to Community resources needed to assist with Independent housing  . Chronic Disease Management support and education needs related to Essential Hypertension, Severe episode of recurrent major depressive disorder without psychotic features, Renal mass, right  . Film/video editor.  . Risk for caregiver burnout (wife is sole caregiver)  Nurse Case Manager Clinical Goal(s):  [name] Over the next 90 days, patient and caregiver Chet Greenley will work with the CCM team and PCP to address needs related to [employer] and support to assist with [place] RN CM Interventions:  01/07/20 call completed with wife Keithon Mccoin, she requested to keep the call brief Inter-disciplinary care team collaboration (see longitudinal plan of care) . Determined Mr and Mrs. [place] sold their home and are currently living in a [place]; discussed patient's wife is feeling less stress and depressed due to no longer having the financial strain of keeping up their home repairs  . Discussed at this time, patient's son from [place] have traveled to [place] to help them move and get resettled into a new home . Determined patient and wife [name] plan to find a small rental home in the [place] area  . Received notification from PCP, patient/caregiver have decided to pursue Independent housing and would like assistance with the appropriate resources  . Collaborated with embedded BSW Tillie Rung  [name] regarding Community resources needed to assist with Independent Living  . Discussed plans with patient for ongoing care management follow up and provided patient with direct contact information for care management team  Patient Self Care Activities:  . Self administers medications as prescribed . Attends all scheduled provider appointments . Calls pharmacy for medication  refills . Calls provider office for new concerns or questions  Initial goal documentation       Plan:   Telephone follow up appointment with care management team member scheduled for: 02/20/20  Barb Merino, RN, BSN, CCM Care Management Coordinator [place] [facility]  Direct Phone: [phone]

## 2020-01-27 ENCOUNTER — Telehealth: Payer: Self-pay

## 2020-01-27 ENCOUNTER — Telehealth: Payer: Medicare Other

## 2020-01-27 NOTE — Telephone Encounter (Signed)
  Chronic Care Management   Outreach Note  01/27/2020 Name: SAVVAS ROPER MRN: [id number] DOB: [birth year]  Referred by: [name], FNP Reason for referral : Care Coordination   An unsuccessful telephone outreach was attempted today. The patient was referred to the case management team for assistance with care management and care coordination.   Follow Up Plan: A HIPPA compliant phone message was left for the patient providing contact information and requesting a return call.  The care management team will reach out to the patient again over the next 10 days.   [name], BSW, CDP Social Worker, Certified Dementia Practitioner [place] [facility] [place] [facility] [phone]

## 2020-02-05 ENCOUNTER — Telehealth: Payer: Self-pay

## 2020-02-05 ENCOUNTER — Telehealth: Payer: Medicare Other

## 2020-02-05 NOTE — Telephone Encounter (Signed)
°  Chronic Care Management   Outreach Note  02/05/2020 Name: [name] MRN: [id number] DOB: 19-Jul-1945  Referred by: [name], FNP Reason for referral : Care Coordination   A second unsuccessful telephone outreach was attempted today. The patient was referred to the case management team for assistance with care management and care coordination.   Follow Up Plan: A HIPPA compliant phone message was left for the patient providing contact information and requesting a return call. SW collaboration with patient primary care provider surrounding concern for inability to maintain patient contact. SW will attempt a third outbound call over the next week. If third call is unsuccessful, SW will attempt to contact patients son.  [name], BSW, CDP Social Worker, Certified Dementia Practitioner [place] [facility] [place] [facility] [phone]

## 2020-02-07 ENCOUNTER — Telehealth: Payer: Medicare Other

## 2020-02-07 ENCOUNTER — Telehealth: Payer: Self-pay

## 2020-02-07 NOTE — Telephone Encounter (Signed)
  Chronic Care Management   Outreach Note  02/07/2020 Name: DEBBIE BELLUCCI MRN: [id number] DOB: [birth year]  Referred by: [name], FNP Reason for referral : Care Coordination   SW placed an unsuccessful outbound call to the patients spouse in response to a voice mail received. SW left a HIPAA compliant voice message requesting a return call.  Follow Up Plan: The care management team will reach out to the patient again over the next 7 days.   [name], BSW, CDP Social Worker, Certified Dementia Practitioner [place] [facility] [place] [facility] [phone]

## 2020-02-12 ENCOUNTER — Ambulatory Visit: Payer: Medicare Other

## 2020-02-12 DIAGNOSIS — I10 Essential (primary) hypertension: Secondary | ICD-10-CM

## 2020-02-12 DIAGNOSIS — F0391 Unspecified dementia with behavioral disturbance: Secondary | ICD-10-CM

## 2020-02-12 NOTE — Chronic Care Management (AMB) (Signed)
Care Management    Social Work Follow Up Note  02/12/2020 Name: [name] MRN: [id number] DOB: 10-Oct-1945  [name] is a 74 y.o. year old male who is a primary care patient of [name], [place]. The CCM team was consulted for assistance with care coordination.   Review of patient status, including review of consultants reports, other relevant assessments, and collaboration with appropriate care team members and the patient's provider was performed as part of comprehensive patient evaluation and provision of chronic care management services.    SDOH (Social Determinants of Health) assessments performed: No    Outpatient Encounter Medications as of 02/12/2020  Medication Sig   amLODipine (NORVASC) 5 MG tablet Take 1 tablet (5 mg total) by mouth daily.   aspirin EC 81 MG tablet Take 81 mg by mouth daily.   escitalopram (LEXAPRO) 5 MG tablet TAKE 1 TABLET DAILY   Memantine HCl-Donepezil HCl (NAMZARIC) 28-10 MG CP24 Take 1 capsule by mouth daily.   pravastatin (PRAVACHOL) 20 MG tablet TAKE 1 TABLET DAILY   rivastigmine (EXELON) 4.6 mg/24hr APPLY ONCE DAILY TRANSDERMALLY   Vitamin D, Ergocalciferol, (DRISDOL) 1.25 MG (50000 UT) CAPS capsule TAKE 1 CAPSULE TWICE A WEEK   No facility-administered encounter medications on file as of 02/12/2020.     Goals Addressed            This Visit's Progress    COMPLETED: "we need help with housing"       Wife stated  CARE PLAN ENTRY (see longitudinal plan of care for additional care plan information)  Current Barriers:   Knowledge Deficits related to Community resources needed to assist with Independent housing   Chronic Disease Management support and education needs related to Essential Hypertension, Severe episode of recurrent major depressive disorder without psychotic features, Renal mass, right   Film/video editor.   Risk for caregiver burnout (wife is sole caregiver)  Nurse Case Manager Clinical Goal(s):    Over the next 90 days, patient and caregiver [name] will work with the CCM team and PCP to address needs related to [employer] and support to assist with [place] SW Interventions: Completed 02/12/20  Successful outbound call placed to the patients spouse/caregvier [name] to assess goal progression  Determined the patient and his spouse plan to move into an apartment Friday 9/17  Goal met  CCM RN CM Interventions:  01/07/20 call completed with wife [name], she requested to keep the call brief Inter-disciplinary care team collaboration (see longitudinal plan of care)  Determined Mr and Mrs. [name] sold their home and are currently living in a [place]; discussed patient's wife is feeling less stress and depressed due to no longer having the financial strain of keeping up their home repairs   Discussed at this time, patient's son from [place] have traveled to [place] to help them move and get resettled into a new home  Determined patient and wife [name] plan to find a small rental home in the [place] area   Received notification from PCP, patient/caregiver have decided to pursue Independent housing and would like assistance with the appropriate resources   Collaborated with embedded [place] regarding Community resources needed to assist with Independent Living   Discussed plans with patient for ongoing care management follow up and provided patient with direct contact information for care management team  Patient Self Care Activities:   Self administers medications as prescribed  Attends all scheduled provider appointments  Calls pharmacy for medication refills  Calls provider office for new concerns or questions  Initial goal documentation         Follow Up Plan: Upon chart review it is noted the last PCP appointment was on 07/09/18 which means the patient is no longer eligible for embedded care management  services until he is seen by his provider. SW collaboration with Consulting civil engineer who has a call scheduled 9/23. RN Care Manager will address this with the patient. No SW follow up planned at this time given patients program eligibility.   [name], BSW, CDP Social Worker, Certified Dementia Practitioner [place] [facility] [place] [facility] [phone]

## 2020-02-12 NOTE — Patient Instructions (Signed)
Visit Information  Goals Addressed            This Visit's Progress   . COMPLETED: "we need help with housing"       Wife stated  CARE PLAN ENTRY (see longitudinal plan of care for additional care plan information)  Current Barriers:  . Knowledge Deficits related to Community resources needed to assist with Independent housing  . Chronic Disease Management support and education needs related to Essential Hypertension, Severe episode of recurrent major depressive disorder without psychotic features, Renal mass, right  . Financial Constraints.  . Risk for caregiver burnout (wife is sole caregiver)  Nurse Case Manager Clinical Goal(s):  . Over the next 90 days, patient and caregiver Barbara Debold will work with the CCM team and PCP to address needs related to Community resources and support to assist with Independent housing  CCM SW Interventions: Completed 02/12/20 . Successful outbound call placed to the patients spouse/caregvier Barbara Mase to assess goal progression . Determined the patient and his spouse plan to move into an apartment Friday 9/17 . Goal met  CCM RN CM Interventions:  01/07/20 call completed with wife Barbara Triska, she requested to keep the call brief Inter-disciplinary care team collaboration (see longitudinal plan of care) . Determined Mr and Mrs. [place] sold their home and are currently living in a [employer] [address] [address]; discussed patient's wife is feeling less stress and depressed due to no longer having the financial strain of keeping up their home repairs  . Discussed at this time, patient's son from [place] have traveled to [place] to help them move and get resettled into a new home . Determined patient and wife Barbara plan to find a small rental home in the  area  . Received notification from PCP, patient/caregiver have decided to pursue Independent housing and would like assistance with the appropriate resources  . Collaborated with embedded BSW    regarding Community resources needed to assist with Independent Living  . Discussed plans with patient for ongoing care management follow up and provided patient with direct contact information for care management team  Patient Self Care Activities:  . Self administers medications as prescribed . Attends all scheduled provider appointments . Calls pharmacy for medication refills . Calls provider office for new concerns or questions  Initial goal documentation        No SW follow up planned at this time. SW will be happy to engage with the patient once he completes an OV with his PCP.   , BSW, CDP Social Worker, Certified Dementia Practitioner [facility] [phone]        

## 2020-02-13 ENCOUNTER — Telehealth: Payer: Self-pay

## 2020-02-13 NOTE — Telephone Encounter (Signed)
Per JM: Call his wife to schedule an appt if she does not want to schedule let her know we can have someone to come and see him at home as his primary care he has not had an appt here since 06/2018.  LVM for pt or wife to contact the office

## 2020-02-20 ENCOUNTER — Telehealth: Payer: Self-pay

## 2020-02-20 ENCOUNTER — Telehealth: Payer: Medicare Other

## 2020-02-20 NOTE — Telephone Encounter (Cosign Needed)
  Chronic Care Management   Outreach Note  02/20/2020 Name: [name] MRN: [id number] DOB: 08-18-1945  Referred by: [name], FNP Reason for referral : Chronic Care Management (FU RN CM Call attempt)   An unsuccessful telephone outreach was attempted today. The patient was referred to the case management team for assistance with care management and care coordination.   Follow Up Plan: A HIPAA compliant phone message was left for the patient providing contact information and requesting a return call.  Telephone follow up appointment with care management team member scheduled for: 03/30/20  Barb Merino, RN, BSN, CCM Care Management Coordinator [place] [facility]  Direct Phone: [phone]

## 2020-03-04 ENCOUNTER — Telehealth: Payer: Self-pay | Admitting: Nurse Practitioner

## 2020-03-04 NOTE — Telephone Encounter (Signed)
I called the pt to schedule AWV. Home number listed (wife) was not in service, so I called the mobile number.  There was no answer and no option to leave a message.

## 2020-03-23 ENCOUNTER — Ambulatory Visit: Payer: Medicare Other | Admitting: Nurse Practitioner

## 2020-03-30 ENCOUNTER — Other Ambulatory Visit: Payer: Self-pay

## 2020-03-30 ENCOUNTER — Ambulatory Visit: Payer: Self-pay

## 2020-03-30 ENCOUNTER — Telehealth: Payer: Medicare Other

## 2020-03-30 DIAGNOSIS — F0391 Unspecified dementia with behavioral disturbance: Secondary | ICD-10-CM

## 2020-03-30 DIAGNOSIS — E559 Vitamin D deficiency, unspecified: Secondary | ICD-10-CM

## 2020-03-30 DIAGNOSIS — R7303 Prediabetes: Secondary | ICD-10-CM

## 2020-03-30 DIAGNOSIS — I10 Essential (primary) hypertension: Secondary | ICD-10-CM

## 2020-03-30 DIAGNOSIS — E782 Mixed hyperlipidemia: Secondary | ICD-10-CM

## 2020-03-31 NOTE — Chronic Care Management (AMB) (Signed)
Care Management   Follow Up Note   03/30/2020 Name: [name] MRN: [id number] DOB: 1946-01-18  Referred by: [name], FNP Reason for referral : Chronic Care Management (CCM RNCM FU Call )   [name] is a 74 y.o. year old male who is a primary care patient of [name], [place]. The CCM team was consulted for assistance with chronic disease management and care coordination needs.    Review of patient status, including review of consultants reports, relevant laboratory and other test results, and collaboration with appropriate care team members and the patient's provider was performed as part of comprehensive patient evaluation and provision of chronic care management services.    SDOH (Social Determinants of Health) assessments performed: Yes - no acute needs identified.  See Care Plan activities for detailed interventions related to SDOH)   Placed CCM RN CM outbound call to wife [name] for a care plan update. Discussed patient is currently overdue for his AWV. Confirmed wife [name] has scheduled his OV for 04/08/20 @10 :30 AM in which she will attend and provide transportation.     Outpatient Encounter Medications as of 03/30/2020  Medication Sig  . amLODipine (NORVASC) 5 MG tablet Take 1 tablet (5 mg total) by mouth daily.  [name] aspirin EC 81 MG tablet Take 81 mg by mouth daily.  [name] escitalopram (LEXAPRO) 5 MG tablet TAKE 1 TABLET DAILY  . Memantine HCl-Donepezil HCl (NAMZARIC) 28-10 MG CP24 Take 1 capsule by mouth daily.  . pravastatin (PRAVACHOL) 20 MG tablet TAKE 1 TABLET DAILY  . rivastigmine (EXELON) 4.6 mg/24hr APPLY ONCE DAILY TRANSDERMALLY  . Vitamin D, Ergocalciferol, (DRISDOL) 1.25 MG (50000 UT) CAPS capsule TAKE 1 CAPSULE TWICE A WEEK   No facility-administered encounter medications on file as of 03/30/2020.     Objective:  Lab Results  Component Value Date   HGBA1C 5.7 (H) 07/26/2018   Lab Results  Component Value Date   MICROALBUR 30 08/09/2018    LDLCALC 100 (H) 07/26/2018   CREATININE 1.15 07/26/2018   BP Readings from Last 3 Encounters:  08/09/18 132/90    Goals Addressed    . Re-evaluate and treat Vitamin D deficiency       CARE PLAN ENTRY (see longitudinal plan of care for additional care plan information)  Current Barriers:  [name] Knowledge Deficits related to disease process and Self Health management of Vitamin D deficiency  . Chronic Disease Management support and education needs related to Dementia with behavior disturbance; Essential Hypertension; Prediabetes; Mixed Hyperlipidemia; Vitamin D deficiency  . Cognitive Deficits  Nurse Case Manager Clinical Goal(s):  [name] Over the next 180 days, patient will work with the CCM team and PCP to address needs related to disease education and support for improved Self Health management of Vitamin D deficiency   Interventions:  03/30/20 call completed with wife [name]  . Inter-disciplinary care team collaboration (see longitudinal plan of care) . Evaluation of current treatment plan related to Vitamin D deficiency and patient's adherence to plan as established by provider . Determined patient's last Vitamin D level was checked approximately 1 year ago with his value down to 4.7 . Reviewed medications with patient and discussed patient is taking Vitamin D (Ergocalciferol) 1.25 mg/50,000 UT capsule, twice weekly   . Determined patient's next PCP f/u is scheduled for 04/08/20 @ 10:30 AM  . Discussed plans with patient for ongoing care management follow up and provided patient with direct contact information for care management team . Provided patient  with printed educational materials related to What is Vitamin D deficiency; FAQ about Vitamin D deficiency   Patient Self Care Activities:  . Attends all scheduled provider appointments . Unable to self administer medications as prescribed . Lacks social connections . Supportive wife to assist with care needs  Initial goal  documentation       . Re-evaluate hgb A1c for Prediabetes       CARE PLAN ENTRY (see longitudinal plan of care for additional care plan information)  Current Barriers:  [name] Knowledge Deficits related to disease process and Self Health management of Prediabetes . Chronic Disease Management support and education needs related to Dementia with behavior disturbance, Essential Hypertension, Prediabetes, Mixed Hyperlipidemia, Vitamin D deficiency  . Cognitive Deficits  Nurse Case Manager Clinical Goal(s):  [name] Over the next 180 days, patient will work with the CCM team and PCP to address needs related to disease education and support for improved Self Health management of Prediabetes   Interventions:  . Inter-disciplinary care team collaboration (see longitudinal plan of care) . Evaluation of current treatment plan related to Prediabetes and patient's adherence to plan as established by provider . Determined patient's last A1c was checked approximately 1 year ago and was noted to be at Prediabetes range 5.7% . Determined patient will f/u with PCP [name], FNP for his AWV scheduled on 04/08/20 @ 10:30 AM  . Discussed plans with patient for ongoing care management follow up and provided patient with direct contact information for care management team . Provided patient with printed educational materials related to Prediabetes  Patient Self Care Activities:  . Attends all scheduled provider appointments . Unable to self administer medications as prescribed . Lacks social connections . Supportive wife to assist with care needs  Initial goal documentation        Plan:   Telephone follow up appointment with care management team member scheduled for: 04/13/20  Barb Merino, RN, BSN, CCM Care Management Coordinator [facility]  Direct Phone: [phone]

## 2020-03-31 NOTE — Patient Instructions (Signed)
Visit Information  Goals Addressed    . Re-evaluate and treat Vitamin D deficiency       CARE PLAN ENTRY (see longitudinal plan of care for additional care plan information)  Current Barriers:  [name] Knowledge Deficits related to disease process and Self Health management of Vitamin D deficiency  . Chronic Disease Management support and education needs related to Dementia with behavior disturbance; Essential Hypertension; Prediabetes; Mixed Hyperlipidemia; Vitamin D deficiency  . Cognitive Deficits  Nurse Case Manager Clinical Goal(s):  [name] Over the next 180 days, patient will work with the CCM team and PCP to address needs related to disease education and support for improved Self Health management of Vitamin D deficiency   Interventions:  03/30/20 call completed with wife Ermin Parisien  . Inter-disciplinary care team collaboration (see longitudinal plan of care) . Evaluation of current treatment plan related to Vitamin D deficiency and patient's adherence to plan as established by provider . Determined patient's last Vitamin D level was checked approximately 1 year ago with his value down to 4.7 . Reviewed medications with patient and discussed patient is taking Vitamin D (Ergocalciferol) 1.25 mg/50,000 UT capsule, twice weekly   . Determined patient's next PCP f/u is scheduled for 04/08/20 @ 10:30 AM  . Discussed plans with patient for ongoing care management follow up and provided patient with direct contact information for care management team . Provided patient with printed educational materials related to What is Vitamin D deficiency; FAQ about Vitamin D deficiency   Patient Self Care Activities:  . Attends all scheduled provider appointments . Unable to self administer medications as prescribed . Lacks social connections . Supportive wife to assist with care needs  Initial goal documentation       . Re-evaluate hgb A1c for Prediabetes       CARE PLAN ENTRY (see longitudinal  plan of care for additional care plan information)  Current Barriers:  [name] Knowledge Deficits related to disease process and Self Health management of Prediabetes . Chronic Disease Management support and education needs related to Dementia with behavior disturbance, Essential Hypertension, Prediabetes, Mixed Hyperlipidemia, Vitamin D deficiency  . Cognitive Deficits  Nurse Case Manager Clinical Goal(s):  [name] Over the next 180 days, patient will work with the CCM team and PCP to address needs related to disease education and support for improved Self Health management of Prediabetes   Interventions:  . Inter-disciplinary care team collaboration (see longitudinal plan of care) . Evaluation of current treatment plan related to Prediabetes and patient's adherence to plan as established by provider . Determined patient's last A1c was checked approximately 1 year ago and was noted to be at Prediabetes range 5.7% . Determined patient will f/u with PCP [name], FNP for his AWV scheduled on 04/08/20 @ 10:30 AM  . Discussed plans with patient for ongoing care management follow up and provided patient with direct contact information for care management team . Provided patient with printed educational materials related to Prediabetes  Patient Self Care Activities:  . Attends all scheduled provider appointments . Unable to self administer medications as prescribed . Lacks social connections . Supportive wife to assist with care needs  Initial goal documentation       Patient verbalizes understanding of instructions provided today.   Telephone follow up appointment with care management team member scheduled for: 04/13/20  Barb Merino, RN, BSN, CCM Care Management Coordinator [facility]  Direct Phone: [phone]

## 2020-04-08 ENCOUNTER — Ambulatory Visit (INDEPENDENT_AMBULATORY_CARE_PROVIDER_SITE_OTHER): Payer: Medicare Other | Admitting: Nurse Practitioner

## 2020-04-08 ENCOUNTER — Encounter: Payer: Self-pay | Admitting: Nurse Practitioner

## 2020-04-08 ENCOUNTER — Other Ambulatory Visit: Payer: Self-pay

## 2020-04-08 ENCOUNTER — Ambulatory Visit (INDEPENDENT_AMBULATORY_CARE_PROVIDER_SITE_OTHER): Payer: Medicare Other

## 2020-04-08 VITALS — BP 142/84 | HR 63 | Temp 97.8°F | Ht 67.2 in | Wt 138.0 lb

## 2020-04-08 DIAGNOSIS — Z0000 Encounter for general adult medical examination without abnormal findings: Secondary | ICD-10-CM

## 2020-04-08 DIAGNOSIS — E782 Mixed hyperlipidemia: Secondary | ICD-10-CM | POA: Diagnosis not present

## 2020-04-08 DIAGNOSIS — Z1159 Encounter for screening for other viral diseases: Secondary | ICD-10-CM | POA: Diagnosis not present

## 2020-04-08 DIAGNOSIS — Z23 Encounter for immunization: Secondary | ICD-10-CM

## 2020-04-08 DIAGNOSIS — R7303 Prediabetes: Secondary | ICD-10-CM | POA: Diagnosis not present

## 2020-04-08 DIAGNOSIS — E559 Vitamin D deficiency, unspecified: Secondary | ICD-10-CM

## 2020-04-08 DIAGNOSIS — I10 Essential (primary) hypertension: Secondary | ICD-10-CM | POA: Diagnosis not present

## 2020-04-08 DIAGNOSIS — F0391 Unspecified dementia with behavioral disturbance: Secondary | ICD-10-CM

## 2020-04-08 MED ORDER — PRAVASTATIN SODIUM 20 MG PO TABS: 20.0000 mg | ORAL_TABLET | Freq: Every day | ORAL | 1 refills | Status: DC

## 2020-04-08 NOTE — Progress Notes (Signed)
Rutherford Nail as a scribe for [name], FNP.,have documented all relevant documentation on the behalf of [name], FNP,as directed by  [name], FNP while in the presence of [name], [place]. This visit occurred during the SARS-CoV-2 public health emergency.  Safety protocols were in place, including screening questions prior to the visit, additional usage of staff PPE, and extensive cleaning of exam room while observing appropriate contact time as indicated for disinfecting solutions.  Subjective:     Patient ID: [name] , male    DOB: January 07, 1946 , 74 y.o.   MRN: [id number]   Chief Complaint  Patient presents with   Prediabetes   vitamin d    HPI  Patient here for a f/u on his vitamin d and prediabetes.    Hypertension This is a chronic problem. The current episode started more than 1 year ago. The problem has been gradually worsening since onset. The problem is uncontrolled. Pertinent negatives include no anxiety, chest pain, headaches or palpitations. There are no associated agents to hypertension. Risk factors for coronary artery disease include sedentary lifestyle. Past treatments include calcium channel blockers. There are no compliance problems.  There is no history of angina. There is no history of chronic renal disease.     Past Medical History:  Diagnosis Date   Hyperlipidemia    Hypertension      History reviewed. No pertinent family history.   Current Outpatient Medications:    amLODipine (NORVASC) 5 MG tablet, Take 1 tablet (5 mg total) by mouth daily., Disp: 90 tablet, Rfl: 0   aspirin EC 81 MG tablet, Take 81 mg by mouth daily. (Patient not taking: Reported on 04/08/2020), Disp: , Rfl:    escitalopram (LEXAPRO) 5 MG tablet, TAKE 1 TABLET DAILY (Patient not taking: Reported on 04/08/2020), Disp: 30 tablet, Rfl: 11   Memantine HCl-Donepezil HCl (NAMZARIC) 28-10 MG CP24, Take 1 capsule by mouth daily. (Patient not taking: Reported  on 04/08/2020), Disp: 90 capsule, Rfl: 0   pravastatin (PRAVACHOL) 20 MG tablet, Take 1 tablet (20 mg total) by mouth daily., Disp: 90 tablet, Rfl: 1   rivastigmine (EXELON) 4.6 mg/24hr, APPLY ONCE DAILY TRANSDERMALLY (Patient not taking: Reported on 04/08/2020), Disp: 30 patch, Rfl: 3   Vitamin D, Ergocalciferol, (DRISDOL) 1.25 MG (50000 UT) CAPS capsule, TAKE 1 CAPSULE TWICE A WEEK (Patient not taking: Reported on 04/08/2020), Disp: 26 capsule, Rfl: 3   No Known Allergies   Review of Systems  Constitutional: Negative.  Negative for fatigue.  HENT: Negative.   Cardiovascular: Negative for chest pain and palpitations.  Gastrointestinal: Negative.   Endocrine: Negative for polydipsia, polyphagia and polyuria.  Musculoskeletal: Negative.   Skin: Negative.   Neurological: Negative for dizziness and headaches.  Psychiatric/Behavioral: Negative.      Today's Vitals   04/08/20 1019  BP: (!) 142/84  Pulse: 63  Temp: 97.8 F (36.6 C)  TempSrc: Oral  Weight: 138 lb (62.6 kg)  Height: 5' 7.2" (1.707 m)  PainSc: 0-No pain   Body mass index is 21.49 kg/m.   Objective:  Physical Exam Constitutional:      General: He is not in acute distress.    Appearance: Normal appearance.  Cardiovascular:     Rate and Rhythm: Normal rate and regular rhythm.     Pulses: Normal pulses.     Heart sounds: Normal heart sounds. No murmur heard.   Pulmonary:     Effort: Pulmonary effort is normal. No respiratory distress.  Breath sounds: Normal breath sounds. No wheezing.  Skin:    Capillary Refill: Capillary refill takes less than 2 seconds.  Neurological:     General: No focal deficit present.     Mental Status: He is alert and oriented to person, place, and time.     Cranial Nerves: No cranial nerve deficit.  Psychiatric:        Mood and Affect: Mood normal.        Behavior: Behavior normal.        Thought Content: Thought content normal.        Judgment: Judgment normal.          Assessment And Plan:     1. Prediabetes  Chronic, diet controlled  Will check hgbA1c - CMP14+EGFR - Hemoglobin A1c  2. Vitamin D deficiency  Will check vitamin D level and supplement as needed.     Also encouraged to spend 15 minutes in the sun daily.  - VITAMIN D 25 Hydroxy (Vit-D Deficiency, Fractures)  3. Essential hypertension Chronic, blood pressure is slightly elevated His wife reports he will sometimes take his medications  4. Mixed hyperlipidemia  Chronic, stable  Tolerating pravastatin well  Encouraged to continue to avoid fried and fatty foods - pravastatin (PRAVACHOL) 20 MG tablet; Take 1 tablet (20 mg total) by mouth daily.  Dispense: 90 tablet; Refill: 1 - CBC - Lipid panel  5. Need for influenza vaccination  Influenza vaccine administered  Encouraged to take Tylenol as needed for fever or muscle aches. - Flu Vaccine QUAD High Dose(Fluad)  6. Encounter for hepatitis C screening test for low risk patient  Will check Hepatitis C screening due to recent recommendations to screen all adults 18 years and older - Hepatitis C antibody  7. Dementia with behavioral disturbance, unspecified dementia type [facility])   He is able to communicate with me today without any concerns, he is calm and cooperative. I did discuss with him considering in the near future to go to an assisted living facility which may help him and his wife as she seems overwhelmed at times.  Patient was given opportunity to ask questions. Patient verbalized understanding of the plan and was able to repeat key elements of the plan. All questions were answered to their satisfaction.    Teola Bradley, FNP, have reviewed all documentation for this visit. The documentation on 04/28/20 for the exam, diagnosis, procedures, and orders are all accurate and complete.  THE PATIENT IS ENCOURAGED TO PRACTICE SOCIAL DISTANCING DUE TO THE COVID-19 PANDEMIC.

## 2020-04-08 NOTE — Patient Instructions (Signed)
[name] , Thank you for taking time to come for your Medicare Wellness Visit. I appreciate your ongoing commitment to your health goals. Please review the following plan we discussed and let me know if I can assist you in the future.   Screening recommendations/referrals: Colonoscopy: completed 02/14/2018, due 02/15/2028 Recommended yearly ophthalmology/optometry visit for glaucoma screening and checkup Recommended yearly dental visit for hygiene and checkup  Vaccinations: Influenza vaccine: today Pneumococcal vaccine: decline Tdap vaccine: decline Shingles vaccine: discussed   Covid-19:  declined  Advanced directives: Advance directive discussed with you today. Even though you declined this today please call our office should you change your mind and we can give you the proper paperwork for you to fill out.  Conditions/risks identified: smoking  Next appointment: Follow up in one year for your annual wellness visit.   Preventive Care 5 Years and Older, Male Preventive care refers to lifestyle choices and visits with your health care provider that can promote health and wellness. What does preventive care include?  A yearly physical exam. This is also called an annual well check.  Dental exams once or twice a year.  Routine eye exams. Ask your health care provider how often you should have your eyes checked.  Personal lifestyle choices, including:  Daily care of your teeth and gums.  Regular physical activity.  Eating a healthy diet.  Avoiding tobacco and drug use.  Limiting alcohol use.  Practicing safe sex.  Taking low doses of aspirin every day.  Taking vitamin and mineral supplements as recommended by your health care provider. What happens during an annual well check? The services and screenings done by your health care provider during your annual well check will depend on your age, overall health, lifestyle risk factors, and family history of  disease. Counseling  Your health care provider may ask you questions about your:  Alcohol use.  Tobacco use.  Drug use.  Emotional well-being.  Home and relationship well-being.  Sexual activity.  Eating habits.  History of falls.  Memory and ability to understand (cognition).  Work and work Statistician. Screening  You may have the following tests or measurements:  Height, weight, and BMI.  Blood pressure.  Lipid and cholesterol levels. These may be checked every 5 years, or more frequently if you are over 46 years old.  Skin check.  Lung cancer screening. You may have this screening every year starting at age 58 if you have a 30-pack-year history of smoking and currently smoke or have quit within the past 15 years.  Fecal occult blood test (FOBT) of the stool. You may have this test every year starting at age 3.  Flexible sigmoidoscopy or colonoscopy. You may have a sigmoidoscopy every 5 years or a colonoscopy every 10 years starting at age 1.  Prostate cancer screening. Recommendations will vary depending on your family history and other risks.  Hepatitis C blood test.  Hepatitis B blood test.  Sexually transmitted disease (STD) testing.  Diabetes screening. This is done by checking your blood sugar (glucose) after you have not eaten for a while (fasting). You may have this done every 1-3 years.  Abdominal aortic aneurysm (AAA) screening. You may need this if you are a current or former smoker.  Osteoporosis. You may be screened starting at age 41 if you are at high risk. Talk with your health care provider about your test results, treatment options, and if necessary, the need for more tests. Vaccines  Your health care provider may  recommend certain vaccines, such as:  Influenza vaccine. This is recommended every year.  Tetanus, diphtheria, and acellular pertussis (Tdap, Td) vaccine. You may need a Td booster every 10 years.  Zoster vaccine. You may  need this after age 36.  Pneumococcal 13-valent conjugate (PCV13) vaccine. One dose is recommended after age 63.  Pneumococcal polysaccharide (PPSV23) vaccine. One dose is recommended after age 2. Talk to your health care provider about which screenings and vaccines you need and how often you need them. This information is not intended to replace advice given to you by your health care provider. Make sure you discuss any questions you have with your health care provider. Document Released: 06/12/2015 Document Revised: 02/03/2016 Document Reviewed: 03/17/2015 Elsevier Interactive Patient Education  2017 [place] Prevention in the Home Falls can cause injuries. They can happen to people of all ages. There are many things you can do to make your home safe and to help prevent falls. What can I do on the outside of my home?  Regularly fix the edges of walkways and driveways and fix any cracks.  Remove anything that might make you trip as you walk through a door, such as a raised step or threshold.  Trim any bushes or trees on the path to your home.  Use bright outdoor lighting.  Clear any walking paths of anything that might make someone trip, such as rocks or tools.  Regularly check to see if handrails are loose or broken. Make sure that both sides of any steps have handrails.  Any raised decks and porches should have guardrails on the edges.  Have any leaves, snow, or ice cleared regularly.  Use sand or salt on walking paths during winter.  Clean up any spills in your garage right away. This includes oil or grease spills. What can I do in the bathroom?  Use night lights.  Install grab bars by the toilet and in the tub and shower. Do not use towel bars as grab bars.  Use non-skid mats or decals in the tub or shower.  If you need to sit down in the shower, use a plastic, non-slip stool.  Keep the floor dry. Clean up any water that spills on the floor as soon as it  happens.  Remove soap buildup in the tub or shower regularly.  Attach bath mats securely with double-sided non-slip rug tape.  Do not have throw rugs and other things on the floor that can make you trip. What can I do in the bedroom?  Use night lights.  Make sure that you have a light by your bed that is easy to reach.  Do not use any sheets or blankets that are too big for your bed. They should not hang down onto the floor.  Have a firm chair that has side arms. You can use this for support while you get dressed.  Do not have throw rugs and other things on the floor that can make you trip. What can I do in the kitchen?  Clean up any spills right away.  Avoid walking on wet floors.  Keep items that you use a lot in easy-to-reach places.  If you need to reach something above you, use a strong step stool that has a grab bar.  Keep electrical cords out of the way.  Do not use floor polish or wax that makes floors slippery. If you must use wax, use non-skid floor wax.  Do not have throw rugs and  other things on the floor that can make you trip. What can I do with my stairs?  Do not leave any items on the stairs.  Make sure that there are handrails on both sides of the stairs and use them. Fix handrails that are broken or loose. Make sure that handrails are as long as the stairways.  Check any carpeting to make sure that it is firmly attached to the stairs. Fix any carpet that is loose or worn.  Avoid having throw rugs at the top or bottom of the stairs. If you do have throw rugs, attach them to the floor with carpet tape.  Make sure that you have a light switch at the top of the stairs and the bottom of the stairs. If you do not have them, ask someone to add them for you. What else can I do to help prevent falls?  Wear shoes that:  Do not have high heels.  Have rubber bottoms.  Are comfortable and fit you well.  Are closed at the toe. Do not wear sandals.  If you  use a stepladder:  Make sure that it is fully opened. Do not climb a closed stepladder.  Make sure that both sides of the stepladder are locked into place.  Ask someone to hold it for you, if possible.  Clearly mark and make sure that you can see:  Any grab bars or handrails.  First and last steps.  Where the edge of each step is.  Use tools that help you move around (mobility aids) if they are needed. These include:  Canes.  Walkers.  Scooters.  Crutches.  Turn on the lights when you go into a dark area. Replace any light bulbs as soon as they burn out.  Set up your furniture so you have a clear path. Avoid moving your furniture around.  If any of your floors are uneven, fix them.  If there are any pets around you, be aware of where they are.  Review your medicines with your doctor. Some medicines can make you feel dizzy. This can increase your chance of falling. Ask your doctor what other things that you can do to help prevent falls. This information is not intended to replace advice given to you by your health care provider. Make sure you discuss any questions you have with your health care provider. Document Released: 03/12/2009 Document Revised: 10/22/2015 Document Reviewed: 06/20/2014 Elsevier Interactive Patient Education  2017 [employer].

## 2020-04-08 NOTE — Progress Notes (Signed)
This visit occurred during the SARS-CoV-2 public health emergency.  Safety protocols were in place, including screening questions prior to the visit, additional usage of staff PPE, and extensive cleaning of exam room while observing appropriate contact time as indicated for disinfecting solutions.  Subjective:   [name] is a 74 y.o. male who presents for Medicare Annual/Subsequent preventive examination.  Review of Systems     Cardiac Risk Factors include: advanced age (>53men, >62 women);male gender;sedentary lifestyle;smoking/ tobacco exposure     Objective:    Today's Vitals   04/08/20 1149  BP: (!) 142/84  Pulse: 63  Temp: 97.8 F (36.6 C)  TempSrc: Oral  Weight: 138 lb (62.6 kg)  Height: 5' 7.2" (1.707 m)   Body mass index is 21.49 kg/m.  Advanced Directives 04/08/2020 08/09/2018 07/26/2018  Does Patient Have a Medical Advance Directive? No Yes No  Type of Advance Directive - [place];Living will -  Does patient want to make changes to medical advance directive? - No - Patient declined -  Copy of [place] in Chart? - No - copy requested -  Would patient like information on creating a medical advance directive? No - Patient declined - Yes (MAU/Ambulatory/Procedural Areas - Information given)    Current Medications (verified) Outpatient Encounter Medications as of 04/08/2020  Medication Sig  . amLODipine (NORVASC) 5 MG tablet Take 1 tablet (5 mg total) by mouth daily. (Patient not taking: Reported on 04/08/2020)  . aspirin EC 81 MG tablet Take 81 mg by mouth daily. (Patient not taking: Reported on 04/08/2020)  . escitalopram (LEXAPRO) 5 MG tablet TAKE 1 TABLET DAILY (Patient not taking: Reported on 04/08/2020)  . Memantine HCl-Donepezil HCl (NAMZARIC) 28-10 MG CP24 Take 1 capsule by mouth daily. (Patient not taking: Reported on 04/08/2020)  . pravastatin (PRAVACHOL) 20 MG tablet Take 1 tablet (20 mg total) by mouth daily.  .  rivastigmine (EXELON) 4.6 mg/24hr APPLY ONCE DAILY TRANSDERMALLY (Patient not taking: Reported on 04/08/2020)  . Vitamin D, Ergocalciferol, (DRISDOL) 1.25 MG (50000 UT) CAPS capsule TAKE 1 CAPSULE TWICE A WEEK (Patient not taking: Reported on 04/08/2020)   No facility-administered encounter medications on file as of 04/08/2020.    Allergies (verified) Patient has no known allergies.   History: Past Medical History:  Diagnosis Date  . Hyperlipidemia   . Hypertension    Past Surgical History:  Procedure Laterality Date  . APPENDECTOMY     History reviewed. No pertinent family history. Social History   Socioeconomic History  . Marital status: Married    Spouse name: Not on file  . Number of children: 3  . Years of education: Not on file  . Highest education level: Associate degree: occupational, Hotel manager, or vocational program  Occupational History  . Occupation: retired  Tobacco Use  . Smoking status: Former Smoker    Types: Cigarettes  . Smokeless tobacco: Never Used  Vaping Use  . Vaping Use: Never used  Substance and Sexual Activity  . Alcohol use: Not Currently  . Drug use: Not Currently  . Sexual activity: Not Currently  Other Topics Concern  . Not on file  Social History Narrative  . Not on file   Social Determinants of Health   Financial Resource Strain: Low Risk   . Difficulty of Paying Living Expenses: Not hard at all  Food Insecurity: No Food Insecurity  . Worried About Charity fundraiser in the Last Year: Never true  . Ran Out of Food  in the Last Year: Never true  Transportation Needs: No Transportation Needs  . Lack of Transportation (Medical): No  . Lack of Transportation (Non-Medical): No  Physical Activity: Inactive  . Days of Exercise per Week: 0 days  . Minutes of Exercise per Session: 0 min  Stress: No Stress Concern Present  . Feeling of Stress : Not at all  Social Connections:   . Frequency of Communication with Friends and Family: Not  on file  . Frequency of Social Gatherings with Friends and Family: Not on file  . Attends Religious Services: Not on file  . Active Member of Clubs or Organizations: Not on file  . Attends Archivist Meetings: Not on file  . Marital Status: Not on file    Tobacco Counseling Counseling given: Not Answered   Clinical Intake:  Pre-visit preparation completed: Yes  Pain : No/denies pain     Nutritional Status: BMI of 19-24  Normal Nutritional Risks: None Diabetes: No  How often do you need to have someone help you when you read instructions, pamphlets, or other written materials from your doctor or pharmacy?: 1 - Never What is the last grade level you completed in school?: associates degree  Diabetic? no  Interpreter Needed?: No  Information entered by :: NAllen LPN   Activities of Daily Living In your present state of health, do you have any difficulty performing the following activities: 04/08/2020 04/08/2020  Hearing? N N  Vision? N N  Difficulty concentrating or making decisions? Tempie Donning  Walking or climbing stairs? N N  Dressing or bathing? N N  Doing errands, shopping? Tempie Donning  Preparing Food and eating ? N -  Using the Toilet? N -  In the past six months, have you accidently leaked urine? N -  Do you have problems with loss of bowel control? N -  Managing your Medications? N -  Managing your Finances? N -  Some recent data might be hidden    Patient Care Team: [name], FNP as PCP - General ([place]) [place], [name], RN as Case Manager  Indicate any recent Medical Services you may have received from other than [facility] providers in the past year (date may be approximate).     Assessment:   This is a routine wellness examination for [facility].  Hearing/Vision screen  Hearing Screening   '125Hz'$  $Remo'250Hz'gbQFf$'500Hz'$'1000Hz'$'2000Hz'$'3000Hz'$'4000Hz'$'6000Hz'$'8000Hz'$   Right ear:           Left ear:           Vision Screening Comments: No regular eye  exams  Dietary issues and exercise activities discussed: Current Exercise Habits: The patient does not participate in regular exercise at present  Goals    . "I need more help"     Spouse stated Current Barriers:  . Chronic Disease Management support and education needs related to Essential Hypertension, Dementia with behavioral disturbance, pre-diabetes . Limited social support . Level of care concerns . Family and relationship dysfunction . Memory Deficits . Inability to perform IADL's independently  Clinical Social Work Clinical Goal(s):  [name] Over the next 60 days, client will work with SW to address concerns related to level of care concerns  Goal Met . 11/29/18- Over the next 15 days patient's wife will contact a [place] Benefits Specialist to obtain assistance with next steps for patient to receive aide and attendance Not Met  . New - 11/25/19 Over the next 60 days, the patients spouse  will work with CCM SW to explore resources to assist with patient care needs  CCM RN CM Interventions:  11/25/19 call completed with wife Eliot Popper . Determined Mrs.Azure continues to be the primary and sole caregiver for Mr.Berberian . Determined she feels as though she can no longer take care of him full time and is uncertain about what the future hold for the 2 of them . Discussed Mrs. Pickrell feels isolated and feels that Mr. Shapley deliberately chooses not to engage in conversation or socialization with her, he does not wish to leave the home and he often refuses to take his medication when she reminds him a dose is due . Determined these circumstances have increased Mrs. Niswander's depression and anxiety as well as having financial strain and the inability to be able to afford to make several home repairs that are needed . Determined her adult sons are not available to assist and they do not offer any support to herself or Mr. Doolen . Determined Mrs. Justiniano has contacted a Cabin crew and has made the decision to  list their home for sale which will occur as early as this week . Determined Mrs. Maffei is considering placing Mr. Clayson in an assistive living facility, she would like to speak with the embedded BSW and would like to seek legal counsel . Provided Mrs. Doberstein the contact information for [employer] and encouraged her to call to schedule a free phone consultation ASAP . Collaborated with embedded BSW [name] regarding Mrs. Ozer's concerns today and discussed Tillie Rung will reach out to offer assistance . Determined Mrs. Morales will f/u with PCP regarding her increased depression and anxiety in order to seek treatment, she is currently not receiving any Psychiatric counseling for this condition nor is she taking an anti-depressant . Sent in basket message to PCP provider [name], FNP with an update regarding the CCM RN CM call that was completed with Mrs. Bond today . Discussed plans with patient for ongoing care management follow up and provided patient with direct contact information for care management team  CCM SW Interventions: Completed 01/09/20 with Tonia Ghent . Successful outbound call placed to Mrs. Chain to assess goal progression . Determined the patient and his spouse are currently residing in a hotel after selling their home . Discussed placement options such as ALF and Independent Living . Determined placement is not an interest at this time due to small living spaces and amount of furniture the family would have to downsize o Mrs. Kreuser reports she continues to have support from her son and is working with a Cabin crew to locate a home and/or apartment to meet she and the patient needs o Discussed Mrs. Bromell plans to remain in the hotel for another 1-2 weeks while touring homes . Encouraged Mrs. Hamblen to contact the embedded care management team as needed . Collaboration with Consulting civil engineer and patient primary provider regarding plan to remain in hotel until a new home is  located . Scheduled follow up call over the next three weeks to assess living arrangement   Patient Self Care Activities:  . Currently UNABLE TO independently perform iADl's or ADl's safely without assistance  Please see past updates related to this goal by clicking on the "Past Updates" button in the selected goal      . Patient Stated     04/08/2020, start walking daily    . Re-evaluate and treat Vitamin D deficiency     CARE PLAN ENTRY (  see longitudinal plan of care for additional care plan information)  Current Barriers:  [name] Knowledge Deficits related to disease process and Self Health management of Vitamin D deficiency  . Chronic Disease Management support and education needs related to Dementia with behavior disturbance; Essential Hypertension; Prediabetes; Mixed Hyperlipidemia; Vitamin D deficiency  . Cognitive Deficits  Nurse Case Manager Clinical Goal(s):  [name] Over the next 180 days, patient will work with the CCM team and PCP to address needs related to disease education and support for improved Self Health management of Vitamin D deficiency   Interventions:  03/30/20 call completed with wife Gio Janoski  . Inter-disciplinary care team collaboration (see longitudinal plan of care) . Evaluation of current treatment plan related to Vitamin D deficiency and patient's adherence to plan as established by provider . Determined patient's last Vitamin D level was checked approximately 1 year ago with his value down to 4.7 . Reviewed medications with patient and discussed patient is taking Vitamin D (Ergocalciferol) 1.25 mg/50,000 UT capsule, twice weekly   . Determined patient's next PCP f/u is scheduled for 04/08/20 @ 10:30 AM  . Discussed plans with patient for ongoing care management follow up and provided patient with direct contact information for care management team . Provided patient with printed educational materials related to What is Vitamin D deficiency; FAQ about Vitamin  D deficiency   Patient Self Care Activities:  . Attends all scheduled provider appointments . Unable to self administer medications as prescribed . Lacks social connections . Supportive wife to assist with care needs  Initial goal documentation       . Re-evaluate hgb A1c for Prediabetes     CARE PLAN ENTRY (see longitudinal plan of care for additional care plan information)  Current Barriers:  [name] Knowledge Deficits related to disease process and Self Health management of Prediabetes . Chronic Disease Management support and education needs related to Dementia with behavior disturbance, Essential Hypertension, Prediabetes, Mixed Hyperlipidemia, Vitamin D deficiency  . Cognitive Deficits  Nurse Case Manager Clinical Goal(s):  [name] Over the next 180 days, patient will work with the CCM team and PCP to address needs related to disease education and support for improved Self Health management of Prediabetes   Interventions:  . Inter-disciplinary care team collaboration (see longitudinal plan of care) . Evaluation of current treatment plan related to Prediabetes and patient's adherence to plan as established by provider . Determined patient's last A1c was checked approximately 1 year ago and was noted to be at Prediabetes range 5.7% . Determined patient will f/u with PCP [name], FNP for his AWV scheduled on 04/08/20 @ 10:30 AM  . Discussed plans with patient for ongoing care management follow up and provided patient with direct contact information for care management team . Provided patient with printed educational materials related to Prediabetes  Patient Self Care Activities:  . Attends all scheduled provider appointments . Unable to self administer medications as prescribed . Lacks social connections . Supportive wife to assist with care needs  Initial goal documentation       Depression Screen PHQ 2/9 Scores 04/08/2020 04/08/2020 08/09/2018 07/09/2018  PHQ - 2 Score 0 0 0  0    Fall Risk Fall Risk  04/08/2020 04/08/2020 08/09/2018 07/09/2018 04/13/2018  Falls in the past year? 0 0 0 0 0  Comment - - - - Emmi Telephone Survey: data to providers prior to load  Risk for fall due to : No Fall Risks - Medication side effect - -  Follow up Education provided;Falls prevention discussed;Falls evaluation completed - Education provided;Falls prevention discussed - -    Any stairs in or around the home? No  If so, are there any without handrails? n/a Home free of loose throw rugs in walkways, pet beds, electrical cords, etc? Yes  Adequate lighting in your home to reduce risk of falls? Yes   ASSISTIVE DEVICES UTILIZED TO PREVENT FALLS:  Life alert? No  Use of a cane, walker or w/c? No  Grab bars in the bathroom? No  Shower chair or bench in shower? No  Elevated toilet seat or a handicapped toilet? No   TIMED UP AND GO:  Was the test performed? No .     Gait steady and fast without use of assistive device  Cognitive Function: MMSE - Mini Mental State Exam 07/09/2018 07/09/2018  Orientation to time 5 5  Orientation to Place 5 5  Registration 3 -  Attention/ Calculation 3 -  Recall 2 -  Language- name 2 objects 2 -  Language- repeat 1 -  Language- follow 3 step command 3 -  Language- read & follow direction 1 -  Write a sentence 1 -  Copy design 1 -  Total score 27 -     6CIT Screen 08/09/2018  What Year? 0 points  What month? 0 points  What time? 3 points  Count back from 20 0 points  Months in reverse 0 points  Repeat phrase 10 points  Total Score 13    Immunizations Immunization History  Administered Date(s) Administered  . Fluad Quad(high Dose 65+) 04/08/2020    TDAP status: Due, Education has been provided regarding the importance of this vaccine. Advised may receive this vaccine at local pharmacy or Health Dept. Aware to provide a copy of the vaccination record if obtained from local pharmacy or Health Dept. Verbalized acceptance and  understanding. Flu Vaccine status: Completed at today's visit Pneumococcal vaccine status: Declined,  Education has been provided regarding the importance of this vaccine but patient still declined. Advised may receive this vaccine at local pharmacy or Health Dept. Aware to provide a copy of the vaccination record if obtained from local pharmacy or Health Dept. Verbalized acceptance and understanding.  Covid-19 vaccine status: Declined, Education has been provided regarding the importance of this vaccine but patient still declined. Advised may receive this vaccine at local pharmacy or Health Dept.or vaccine clinic. Aware to provide a copy of the vaccination record if obtained from local pharmacy or Health Dept. Verbalized acceptance and understanding.  Qualifies for Shingles Vaccine? Yes   Zostavax completed No   Shingrix Completed?: No.    Education has been provided regarding the importance of this vaccine. Patient has been advised to call insurance company to determine out of pocket expense if they have not yet received this vaccine. Advised may also receive vaccine at local pharmacy or Health Dept. Verbalized acceptance and understanding.  Screening Tests Health Maintenance  Topic Date Due  . COVID-19 Vaccine (1) Never done  . PNA vac Low Risk Adult (1 of 2 - PCV13) Never done  . TETANUS/TDAP  01/07/2024  . COLONOSCOPY  02/15/2028  . INFLUENZA VACCINE  Completed  . Hepatitis C Screening  Completed    Health Maintenance  Health Maintenance Due  Topic Date Due  . COVID-19 Vaccine (1) Never done  . PNA vac Low Risk Adult (1 of 2 - PCV13) Never done    Colorectal cancer screening: Completed 02/14/2018. Repeat every 10 years  Lung  Cancer Screening: (Low Dose CT Chest recommended if Age 15-80 years, 30 pack-year currently smoking OR have quit w/in 15years.) does not qualify.   Lung Cancer Screening Referral: no  Additional Screening:  Hepatitis C Screening: does qualify; Completed  today  Vision Screening: Recommended annual ophthalmology exams for early detection of glaucoma and other disorders of the eye. Is the patient up to date with their annual eye exam?  No  Who is the provider or what is the name of the office in which the patient attends annual eye exams? none If pt is not established with a provider, would they like to be referred to a provider to establish care? No .   Dental Screening: Recommended annual dental exams for proper oral hygiene  Community Resource Referral / Chronic Care Management: CRR required this visit?  No   CCM required this visit?  No      Plan:     I have personally reviewed and noted the following in the patient's chart:   . Medical and social history . Use of alcohol, tobacco or illicit drugs  . Current medications and supplements . Functional ability and status . Nutritional status . Physical activity . Advanced directives . List of other physicians . Hospitalizations, surgeries, and ER visits in previous 12 months . Vitals . Screenings to include cognitive, depression, and falls . Referrals and appointments  In addition, I have reviewed and discussed with patient certain preventive protocols, quality metrics, and best practice recommendations. A written personalized care plan for preventive services as well as general preventive health recommendations were provided to patient.     Kellie Simmering, LPN   58/25/1898   Nurse Notes: 6 CIT not administered patient refused.

## 2020-04-09 LAB — CMP14+EGFR
ALT: 13 IU/L (ref 0–44)
AST: 11 IU/L (ref 0–40)
Albumin/Globulin Ratio: 1.3 (ref 1.2–2.2)
Albumin: 4.4 g/dL (ref 3.7–4.7)
Alkaline Phosphatase: 62 IU/L (ref 44–121)
BUN/Creatinine Ratio: 16 (ref 10–24)
BUN: 17 mg/dL (ref 8–27)
Bilirubin Total: 0.3 mg/dL (ref 0.0–1.2)
CO2: 24 mmol/L (ref 20–29)
Calcium: 9.8 mg/dL (ref 8.6–10.2)
Chloride: 105 mmol/L (ref 96–106)
Creatinine, Ser: 1.07 mg/dL (ref 0.76–1.27)
GFR calc Af Amer: 78 mL/min/{1.73_m2} (ref >59)
GFR calc non Af Amer: 68 mL/min/{1.73_m2} (ref >59)
Globulin, Total: 3.3 g/dL (ref 1.5–4.5)
Glucose: 96 mg/dL (ref 65–99)
Potassium: 5.2 mmol/L (ref 3.5–5.2)
Sodium: 143 mmol/L (ref 134–144)
Total Protein: 7.7 g/dL (ref 6.0–8.5)

## 2020-04-09 LAB — CBC
Hematocrit: 42.1 % (ref 37.5–51.0)
Hemoglobin: 13.8 g/dL (ref 13.0–17.7)
MCH: 27.8 pg (ref 26.6–33.0)
MCHC: 32.8 g/dL (ref 31.5–35.7)
MCV: 85 fL (ref 79–97)
Platelets: 260 10*3/uL (ref 150–450)
RBC: 4.96 x10E6/uL (ref 4.14–5.80)
RDW: 13.4 % (ref 11.6–15.4)
WBC: 6.6 10*3/uL (ref 3.4–10.8)

## 2020-04-09 LAB — LIPID PANEL
Chol/HDL Ratio: 4.7 ratio (ref 0.0–5.0)
Cholesterol, Total: 201 mg/dL — ABNORMAL HIGH (ref 100–199)
HDL: 43 mg/dL (ref >39)
LDL Chol Calc (NIH): 145 mg/dL — ABNORMAL HIGH (ref 0–99)
Triglycerides: 71 mg/dL (ref 0–149)
VLDL Cholesterol Cal: 13 mg/dL (ref 5–40)

## 2020-04-09 LAB — HEMOGLOBIN A1C
Est. average glucose Bld gHb Est-mCnc: 111 mg/dL
Hgb A1c MFr Bld: 5.5 % (ref 4.8–5.6)

## 2020-04-09 LAB — HEPATITIS C ANTIBODY: Hep C Virus Ab: <0.1 s/co ratio (ref 0.0–0.9)

## 2020-04-09 LAB — VITAMIN D 25 HYDROXY (VIT D DEFICIENCY, FRACTURES): Vit D, 25-Hydroxy: 61.5 ng/mL (ref 30.0–100.0)

## 2020-04-10 ENCOUNTER — Other Ambulatory Visit: Payer: Self-pay

## 2020-04-10 DIAGNOSIS — I10 Essential (primary) hypertension: Secondary | ICD-10-CM

## 2020-04-10 MED ORDER — AMLODIPINE BESYLATE 5 MG PO TABS: 5.0000 mg | ORAL_TABLET | Freq: Every day | ORAL | 0 refills | Status: DC

## 2020-04-13 ENCOUNTER — Telehealth: Payer: Medicare Other

## 2020-05-05 ENCOUNTER — Telehealth: Payer: Self-pay

## 2020-05-05 ENCOUNTER — Telehealth: Payer: Medicare Other

## 2020-05-05 NOTE — Telephone Encounter (Cosign Needed)
  Chronic Care Management   Outreach Note  05/05/2020 Name: [name] MRN: [id number] DOB: 1945/11/16  Referred by: [name], FNP Reason for referral : Chronic Care Management (RNCM FU Call )   [name] is enrolled in a Managed Medicaid Health Plan: No  An unsuccessful telephone outreach was attempted today. The patient was referred to the case management team for assistance with care management and care coordination.   Follow Up Plan: A HIPAA compliant phone message was left for the patient providing contact information and requesting a return call. Telephone follow up appointment with care management team member scheduled for: 06/12/20  Barb Merino, RN, BSN, CCM Care Management Coordinator [place] [facility]  Direct Phone: [phone]

## 2020-05-14 ENCOUNTER — Ambulatory Visit: Payer: Medicare Other

## 2020-05-14 DIAGNOSIS — I10 Essential (primary) hypertension: Secondary | ICD-10-CM

## 2020-05-14 DIAGNOSIS — R7303 Prediabetes: Secondary | ICD-10-CM

## 2020-05-14 NOTE — Chronic Care Management (AMB) (Signed)
°  Chronic Care Management    Social Work Follow Up Note  05/14/2020 Name: ORI TREJOS MRN: [id number] DOB: 1945-09-03  BODE PIEPER is a 74 y.o. year old male who is a primary care patient of [name], [place]. The CCM team was consulted for assistance with care coordination.   Review of patient status, including review of consultants reports, other relevant assessments, and collaboration with appropriate care team members and the patient's provider was performed as part of comprehensive patient evaluation and provision of chronic care management services.    SW placed a successful outbound call to the patient's spouse to assess for care coordination needs. Mrs. Commins requests SW contact her at a later date to schedule a face to face visit.    Outpatient Encounter Medications as of 05/14/2020  Medication Sig   amLODipine (NORVASC) 5 MG tablet Take 1 tablet (5 mg total) by mouth daily.   aspirin EC 81 MG tablet Take 81 mg by mouth daily. (Patient not taking: Reported on 04/08/2020)   escitalopram (LEXAPRO) 5 MG tablet TAKE 1 TABLET DAILY (Patient not taking: Reported on 04/08/2020)   Memantine HCl-Donepezil HCl (NAMZARIC) 28-10 MG CP24 Take 1 capsule by mouth daily. (Patient not taking: Reported on 04/08/2020)   pravastatin (PRAVACHOL) 20 MG tablet Take 1 tablet (20 mg total) by mouth daily.   rivastigmine (EXELON) 4.6 mg/24hr APPLY ONCE DAILY TRANSDERMALLY (Patient not taking: Reported on 04/08/2020)   Vitamin D, Ergocalciferol, (DRISDOL) 1.25 MG (50000 UT) CAPS capsule TAKE 1 CAPSULE TWICE A WEEK (Patient not taking: Reported on 04/08/2020)   No facility-administered encounter medications on file as of 05/14/2020.     Follow Up Plan: SW will follow up with patient by phone over the next three weeks.   [name], BSW, CDP Social Worker, Certified Dementia Practitioner [place] [facility] [place] [facility] [phone]

## 2020-05-15 ENCOUNTER — Telehealth: Payer: Medicare Other

## 2020-06-01 ENCOUNTER — Telehealth: Payer: Medicare Other

## 2020-06-01 ENCOUNTER — Telehealth: Payer: Self-pay

## 2020-06-01 NOTE — Telephone Encounter (Signed)
  Chronic Care Management   Outreach Note  06/01/2020 Name: [name] MRN: [id number] DOB: 1945/12/04  Referred by: Arnette Felts, FNP Reason for referral : Care Coordination   [name] is enrolled in a Managed Medicaid Health Plan: No  An unsuccessful telephone outreach was attempted today. The patient was referred to the case management team for assistance with care management and care coordination.   Follow Up Plan: A HIPAA compliant phone message was left for the patient providing contact information and requesting a return call.  The care management team will reach out to the patient again over the next 14 days.   Bevelyn Ngo, BSW, CDP Social Worker, Certified Dementia Practitioner [facility] Care Management [phone]

## 2020-06-12 ENCOUNTER — Telehealth: Payer: Medicare Other

## 2020-06-15 ENCOUNTER — Telehealth: Payer: Medicare Other

## 2020-06-22 ENCOUNTER — Telehealth: Payer: Self-pay

## 2020-06-22 ENCOUNTER — Telehealth: Payer: Medicare Other

## 2020-06-22 NOTE — Telephone Encounter (Signed)
  Chronic Care Management   Outreach Note  06/22/2020 Name: [name] MRN: [id number] DOB: [birth year]  Referred by: [name], FNP Reason for referral : Care Coordination   A second unsuccessful telephone outreach was attempted today. The patient was referred to the case management team for assistance with care management and care coordination.   Follow Up Plan: A HIPAA compliant phone message was left for the patient providing contact information and requesting a return call.  The care management team will reach out to the patient again over the next 21 days.   [name], BSW, CDP Social Worker, Certified Dementia Practitioner [place] [facility] [place] [facility] [phone]

## 2020-07-09 ENCOUNTER — Telehealth: Payer: Self-pay

## 2020-07-09 ENCOUNTER — Telehealth: Payer: Medicare Other

## 2020-07-09 NOTE — Telephone Encounter (Signed)
  Chronic Care Management   Outreach Note  07/09/2020 Name: [name] MRN: [id number] DOB: 01-31-1946  Referred by: [name], FNP Reason for referral : Care Coordination     SW placed a successful outbound call to the patients spouse to assist with care coordination needs. Unfortunately, after multiple attempts, SW was unable to hear Mrs. Zumbro as her phone kept going in and out.   Follow Up Plan: The care management team will reach out to the patient again over the next 14 days.   [name], BSW, CDP Social Worker, Certified Dementia Practitioner [place] / [place] [facility] [phone]

## 2020-07-15 ENCOUNTER — Ambulatory Visit (INDEPENDENT_AMBULATORY_CARE_PROVIDER_SITE_OTHER): Payer: Medicare Other

## 2020-07-15 DIAGNOSIS — F0391 Unspecified dementia with behavioral disturbance: Secondary | ICD-10-CM

## 2020-07-15 DIAGNOSIS — I10 Essential (primary) hypertension: Secondary | ICD-10-CM

## 2020-07-15 NOTE — Chronic Care Management (AMB) (Signed)
Chronic Care Management    Social Work Note  07/15/2020 Name: [name] MRN: [id number] DOB: Jun 02, 1945  [name] is a 75 y.o. year old male who is a primary care patient of [name], [place]. The CCM team was consulted to assist the patient with chronic disease management and/or care coordination needs related to: Level of Care Concerns.   Engaged with patient spouse by phone for follow up visit in response to provider referral for social work chronic care management and care coordination services.   Consent to Services:  The patient was given information about Chronic Care Management services, agreed to services, and gave verbal consent prior to initiation of services.  Please see initial visit note for detailed documentation.   Patient agreed to services and consent obtained.   Assessment: Review of patient past medical history, allergies, medications, and health status, including review of relevant consultants reports was performed today as part of a comprehensive evaluation and provision of chronic care management and care coordination services.     SDOH (Social Determinants of Health) assessments and interventions performed:    Advanced Directives Status: Not addressed in this encounter.  CCM Care Plan  No Known Allergies  Outpatient Encounter Medications as of 07/15/2020  Medication Sig  . amLODipine (NORVASC) 5 MG tablet Take 1 tablet (5 mg total) by mouth daily.  [name] aspirin EC 81 MG tablet Take 81 mg by mouth daily. (Patient not taking: Reported on 04/08/2020)  . escitalopram (LEXAPRO) 5 MG tablet TAKE 1 TABLET DAILY (Patient not taking: Reported on 04/08/2020)  . Memantine HCl-Donepezil HCl (NAMZARIC) 28-10 MG CP24 Take 1 capsule by mouth daily. (Patient not taking: Reported on 04/08/2020)  . pravastatin (PRAVACHOL) 20 MG tablet Take 1 tablet (20 mg total) by mouth daily.  . rivastigmine (EXELON) 4.6 mg/24hr APPLY ONCE DAILY TRANSDERMALLY (Patient not taking:  Reported on 04/08/2020)  . Vitamin D, Ergocalciferol, (DRISDOL) 1.25 MG (50000 UT) CAPS capsule TAKE 1 CAPSULE TWICE A WEEK (Patient not taking: Reported on 04/08/2020)   No facility-administered encounter medications on file as of 07/15/2020.    There are no problems to display for this patient.   Conditions to be addressed/monitored: HTN and Dementia; Level of care concerns  Care Plan : Social Work [facility] Care Plan  Updates made by [name] since 07/15/2020 12:00 AM    Problem: Disease Progression - Dementia     Long-Range Goal: Disease Progression Managed with Education and Resources   Start Date: 07/15/2020  Expected End Date: 11/12/2020  Priority: High  Note:   Current Barriers:  . Level of care concerns . Memory Deficits . Chronic conditions including HTN and Dementia  Social Work Clinical Goal(s):  [name] Over the next 120 days the patient and his spouse will work with SW to identify resources to assist with disease progression  Interventions: . 1:1 collaboration with [name], [place] regarding development and update of comprehensive plan of care as evidenced by provider attestation and co-signature . Inter-disciplinary care team collaboration (see longitudinal plan of care) . Successful outbound call placed to the patients spouse, [name] to assess for care coordination needs . Discussed the patient continues to isolate in his room, is not bathing, or taking medications . Determined [name] would like assistance in the home to provide care to the patient . Reviewed the patient is well over the income limit to qualify for services such as PCS or PACE . Attempted to provide resource information for out of pocket  services - [name] declined stating "he served this [place] for 28 years I shouldn't have to pay for a thing" . Assessed for interest in placement - [name] declined placement stating she wanted the patient to stay in the home . Advised [name] that  SW would collaborate with RN Care Manager to discuss possible resources for the patient . Collaboration with [employer]  . Scheduled follow up call over the next 14 days  Patient Goals/Self-Care Activities Over the next 30 days, patient will: With the help of his spouse  - Patient will call provider office for new concerns or questions Contact SW as needed prior to next scheduled call  Follow up Plan: SW will follow up with patient by phone over the next 14 days       Follow Up Plan: SW will follow up with patient by phone over the next two weeks      [name], BSW, CDP Social Worker, Certified Dementia Practitioner [place] [facility] [place] [facility] [phone]  Total time spent performing care coordination and/or care management activities with the patient by phone or face to face = 30 minutes.

## 2020-07-15 NOTE — Patient Instructions (Signed)
Goals we discussed today:  Goals Addressed    . Disease Progression Managed       Timeframe:  Long-Range Goal Priority:  High Start Date:     2.16.22                        Expected End Date: 6.16.22                      Next planned outreach date: 2.25.22  Patient Goals/Self-Care Activities Over the next 30 days, patient will: With the help of his spouse  - Patient will call provider office for new concerns or questions Contact SW as needed prior to next scheduled call

## 2020-07-16 ENCOUNTER — Telehealth: Payer: Medicare Other

## 2020-07-16 ENCOUNTER — Telehealth: Payer: Self-pay

## 2020-07-16 NOTE — Telephone Encounter (Cosign Needed)
  Chronic Care Management   Outreach Note  07/16/2020 Name: [name] MRN: [id number] DOB: 1946-02-06  Referred by: [name], FNP Reason for referral : Chronic Care Management (RN CM FU Call Attempt )   An unsuccessful telephone outreach was attempted today. The patient was referred to the case management team for assistance with care management and care coordination.   Follow Up Plan: A HIPAA compliant phone message was left for the patient providing contact information and requesting a return call. Telephone follow up appointment with care management team member scheduled for: 08/14/20  Barb Merino, RN, BSN, CCM Care Management Coordinator [place] [facility]  Direct Phone: [phone]

## 2020-07-22 ENCOUNTER — Ambulatory Visit: Payer: Medicare Other

## 2020-07-22 DIAGNOSIS — I10 Essential (primary) hypertension: Secondary | ICD-10-CM

## 2020-07-22 DIAGNOSIS — F0391 Unspecified dementia with behavioral disturbance: Secondary | ICD-10-CM | POA: Diagnosis not present

## 2020-07-22 NOTE — Chronic Care Management (AMB) (Signed)
Chronic Care Management    Social Work Note  07/22/2020 Name: [name] MRN: [id number] DOB: 1946-04-17  [name] is a 75 y.o. year old male who is a primary care patient of [name], [place]. The CCM team was consulted to assist the patient with chronic disease management and/or care coordination needs related to: Level of Care Concerns.   Collaboration with patient primary care team for discssion surrounding patient care needs in response to provider referral for social work chronic care management and care coordination services.   Consent to Services:  The patient was given information about Chronic Care Management services, agreed to services, and gave verbal consent prior to initiation of services.  Please see initial visit note for detailed documentation.   Patient agreed to services and consent obtained.   Assessment: Review of patient past medical history, allergies, medications, and health status, including review of relevant consultants reports was performed today as part of a comprehensive evaluation and provision of chronic care management and care coordination services.     SDOH (Social Determinants of Health) assessments and interventions performed:    Advanced Directives Status: Not addressed in this encounter.  CCM Care Plan  No Known Allergies  Outpatient Encounter Medications as of 07/22/2020  Medication Sig  . amLODipine (NORVASC) 5 MG tablet Take 1 tablet (5 mg total) by mouth daily.  [name] aspirin EC 81 MG tablet Take 81 mg by mouth daily. (Patient not taking: Reported on 04/08/2020)  . escitalopram (LEXAPRO) 5 MG tablet TAKE 1 TABLET DAILY (Patient not taking: Reported on 04/08/2020)  . Memantine HCl-Donepezil HCl (NAMZARIC) 28-10 MG CP24 Take 1 capsule by mouth daily. (Patient not taking: Reported on 04/08/2020)  . pravastatin (PRAVACHOL) 20 MG tablet Take 1 tablet (20 mg total) by mouth daily.  . rivastigmine (EXELON) 4.6 mg/24hr APPLY ONCE DAILY  TRANSDERMALLY (Patient not taking: Reported on 04/08/2020)  . Vitamin D, Ergocalciferol, (DRISDOL) 1.25 MG (50000 UT) CAPS capsule TAKE 1 CAPSULE TWICE A WEEK (Patient not taking: Reported on 04/08/2020)   No facility-administered encounter medications on file as of 07/22/2020.    There are no problems to display for this patient.   Conditions to be addressed/monitored: HTN and Dementia; Level of care concerns and Family and relationship dysfunction  Care Plan : Social Work [facility] Care Plan  Updates made by [name] since 07/22/2020 12:00 AM    Problem: Home and Family Safety (Wellness)     Goal: Home and Family Safety Maintained   Start Date: 07/22/2020  Expected End Date: 09/05/2020  Priority: High  Note:   Current Barriers:  . Chronic disease management support and education needs related to HTN and Dementia  . Limited social support . Level of care concerns . Family and relationship dysfunction . Social Isolation, . Memory Deficits  Social Worker Clinical Goal(s):   [name] Over the next 45 days, patient will work with SW to address concerns related to home and family safety  CCM SW Interventions:  . Inter-disciplinary care team collaboration (see longitudinal plan of care) . Collaboration with Eugene Brine, FNP regarding development and update of comprehensive plan of care as evidenced by provider attestation and co-signature . Discussed concern surrounding family stress related to patient care needs as well as difficulty staying engaged with patient spouse with patients primary provider [name], [place] . Reviewed concern regarding whether patient may be better served in an assisted living facility considering the patients caregiver/spouse has expressed on numerous occassions her inability to  care for the patient. Unfortunately, [name] is reluctant to place the patient or hire a caregiver in the home . Determined the patients primary provider is agreeable to SW placing an  APS referral in order to have the patients care needs assessed within the home . Collaboration with RN Care Manager, [place] to discuss patient primary provider recommendations . Discussed RN Care Manager is also agreeable to APS referral due to concerns as outlined above . SW placed an unsuccessful outbound call to [place]; voice message left requesting a return call . SW will follow up with APS over the next 24 hours if a return call is not received  Patient Goals/Self-Care Activities . Over the next 20 days, patient will:   - Patient will attend all scheduled provider appointments Engage with [place] as warranted once referral is placed  Follow Up Plan:  SW will follow up with APS over the next 24 hours        Follow Up Plan: Social work will follow up with [facility] APS over the next 24 hours.      [name], BSW, CDP Social Worker, Certified Dementia Practitioner [place] [facility] [place] [facility] [phone]  Total time spent performing care coordination and/or care management activities with the patient by phone or face to face = 30 minutes.

## 2020-07-23 ENCOUNTER — Ambulatory Visit: Payer: Medicare Other

## 2020-07-23 DIAGNOSIS — I10 Essential (primary) hypertension: Secondary | ICD-10-CM

## 2020-07-23 DIAGNOSIS — F0391 Unspecified dementia with behavioral disturbance: Secondary | ICD-10-CM

## 2020-07-23 NOTE — Chronic Care Management (AMB) (Signed)
Chronic Care Management    Social Work Note  07/23/2020 Name: [name] MRN: [id number] DOB: [birth year]  [name] is a 75 y.o. year old male who is a primary care patient of [name], [place]. The CCM team was consulted to assist the patient with chronic disease management and/or care coordination needs related to: Level of Care Concerns.   Collaboration with [facility] APS for purpose of placing APS report in response to provider referral for social work chronic care management and care coordination services.   Consent to Services:  The patient was given information about Chronic Care Management services, agreed to services, and gave verbal consent prior to initiation of services.  Please see initial visit note for detailed documentation.   Patient agreed to services and consent obtained.   Assessment: Review of patient past medical history, allergies, medications, and health status, including review of relevant consultants reports was performed today as part of a comprehensive evaluation and provision of chronic care management and care coordination services.     SDOH (Social Determinants of Health) assessments and interventions performed:    Advanced Directives Status: Not addressed in this encounter.  CCM Care Plan  No Known Allergies  Outpatient Encounter Medications as of 07/23/2020  Medication Sig  . amLODipine (NORVASC) 5 MG tablet Take 1 tablet (5 mg total) by mouth daily.  [name] aspirin EC 81 MG tablet Take 81 mg by mouth daily. (Patient not taking: Reported on 04/08/2020)  . escitalopram (LEXAPRO) 5 MG tablet TAKE 1 TABLET DAILY (Patient not taking: Reported on 04/08/2020)  . Memantine HCl-Donepezil HCl (NAMZARIC) 28-10 MG CP24 Take 1 capsule by mouth daily. (Patient not taking: Reported on 04/08/2020)  . pravastatin (PRAVACHOL) 20 MG tablet Take 1 tablet (20 mg total) by mouth daily.  . rivastigmine (EXELON) 4.6 mg/24hr APPLY ONCE DAILY TRANSDERMALLY (Patient  not taking: Reported on 04/08/2020)  . Vitamin D, Ergocalciferol, (DRISDOL) 1.25 MG (50000 UT) CAPS capsule TAKE 1 CAPSULE TWICE A WEEK (Patient not taking: Reported on 04/08/2020)   No facility-administered encounter medications on file as of 07/23/2020.    There are no problems to display for this patient.   Conditions to be addressed/monitored: HTN and Dementia; Level of care concerns and Family and relationship dysfunction  Care Plan : Social Work [facility] Care Plan  Updates made by [name] since 07/23/2020 12:00 AM    Problem: Home and Family Safety (Wellness)     Goal: Home and Family Safety Maintained   Start Date: 07/22/2020  Expected End Date: 09/05/2020  This Visit's Progress: On track  Priority: High  Note:   Current Barriers:  . Chronic disease management support and education needs related to HTN and Dementia  . Limited social support . Level of care concerns . Family and relationship dysfunction . Social Isolation . Memory Deficits  Social Worker Clinical Goal(s):   [name] Over the next 45 days, patient will work with SW to address concerns related to home and family safety  CCM SW Interventions:  . Inter-disciplinary care team collaboration (see longitudinal plan of care) . Collaboration with [name], FNP regarding development and update of comprehensive plan of care as evidenced by provider attestation and co-signature . Successful outbound call placed to [facility] to place APS report . Provided SW contact number to request return call regarding if a case will be opened  Patient Goals/Self-Care Activities . Over the next 20 days, patient will:   - Patient will attend  all scheduled provider appointments Engage with [place] as warranted once referral is placed  Follow Up Plan: The care management team will reach out to the patient again over the next 5 days.         Follow Up Plan: SW will follow up with  patient by phone over the next 5 days.      [name], BSW, CDP Social Worker, Certified Dementia Practitioner [place] [facility] [place] [facility] [phone]  Total time spent performing care coordination and/or care management activities with the patient by phone or face to face = 28 minutes.

## 2020-07-23 NOTE — Patient Instructions (Signed)
Goals we discussed today:  Goals Addressed            This Visit's Progress   . Home and Family Safety Maintained       Timeframe:  Short-Term Goal Priority:  High Start Date:  2.23.22                           Expected End Date: 4.9.22      Next planned outreach planned for: 2.25.22                   Patient Goals/Self-Care Activities . Over the next 20 days, patient will:   - Patient will attend all scheduled provider appointments Engage with [place] as warranted once referral is placed

## 2020-07-24 ENCOUNTER — Telehealth: Payer: Self-pay

## 2020-07-24 ENCOUNTER — Telehealth: Payer: Medicare Other

## 2020-07-24 ENCOUNTER — Ambulatory Visit: Payer: Self-pay

## 2020-07-24 DIAGNOSIS — F0391 Unspecified dementia with behavioral disturbance: Secondary | ICD-10-CM

## 2020-07-24 DIAGNOSIS — I10 Essential (primary) hypertension: Secondary | ICD-10-CM

## 2020-07-24 NOTE — Telephone Encounter (Signed)
  Chronic Care Management   Outreach Note  07/24/2020 Name: [name] MRN: [id number] DOB: [birth year]  Referred by: [name], FNP Reason for referral : Chronic Care Management   An unsuccessful telephone outreach was attempted today. The patient was referred to the case management team for assistance with care management and care coordination.   Follow Up Plan: A HIPAA compliant phone message was left for the patient providing contact information and requesting a return call.  The care management team will reach out to the patient again over the next 21 days.   [name], BSW, CDP Social Worker, Certified Dementia Practitioner [place] [facility] [place] [facility] [phone]

## 2020-07-24 NOTE — Chronic Care Management (AMB) (Signed)
Chronic Care Management    Social Work Note  07/24/2020 Name: [name] MRN: [id number] DOB: Mar 22, 1946  [name] is a 75 y.o. year old male who is a primary care patient of [name], [place]. The CCM team was consulted to assist the patient with chronic disease management and/or care coordination needs related to: Level of Care Concerns.   Collaboration with [facility] APS for case discussion in response to provider referral for social work chronic care management and care coordination services.   Consent to Services:  The patient was given information about Chronic Care Management services, agreed to services, and gave verbal consent prior to initiation of services.  Please see initial visit note for detailed documentation.   Patient agreed to services and consent obtained.   Assessment: Review of patient past medical history, allergies, medications, and health status, including review of relevant consultants reports was performed today as part of a comprehensive evaluation and provision of chronic care management and care coordination services.     SDOH (Social Determinants of Health) assessments and interventions performed:    Advanced Directives Status: Not addressed in this encounter.  CCM Care Plan  No Known Allergies  Outpatient Encounter Medications as of 07/24/2020  Medication Sig  . amLODipine (NORVASC) 5 MG tablet Take 1 tablet (5 mg total) by mouth daily.  [name] aspirin EC 81 MG tablet Take 81 mg by mouth daily. (Patient not taking: Reported on 04/08/2020)  . escitalopram (LEXAPRO) 5 MG tablet TAKE 1 TABLET DAILY (Patient not taking: Reported on 04/08/2020)  . Memantine HCl-Donepezil HCl (NAMZARIC) 28-10 MG CP24 Take 1 capsule by mouth daily. (Patient not taking: Reported on 04/08/2020)  . pravastatin (PRAVACHOL) 20 MG tablet Take 1 tablet (20 mg total) by mouth daily.  . rivastigmine (EXELON) 4.6 mg/24hr APPLY ONCE DAILY TRANSDERMALLY (Patient not taking:  Reported on 04/08/2020)  . Vitamin D, Ergocalciferol, (DRISDOL) 1.25 MG (50000 UT) CAPS capsule TAKE 1 CAPSULE TWICE A WEEK (Patient not taking: Reported on 04/08/2020)   No facility-administered encounter medications on file as of 07/24/2020.    There are no problems to display for this patient.   Conditions to be addressed/monitored: HTN and Dementia; Family and relationship dysfunction  Care Plan : Social Work [facility] Care Plan  Updates made by [name] since 07/24/2020 12:00 AM    Problem: Home and Family Safety (Wellness)     Goal: Home and Family Safety Maintained   Start Date: 07/22/2020  Expected End Date: 09/05/2020  Recent Progress: On track  Priority: High  Note:   Current Barriers:  . Chronic disease management support and education needs related to HTN and Dementia  . Limited social support . Level of care concerns . Family and relationship dysfunction . Social Isolation . Memory Deficits  Social Worker Clinical Goal(s):   [name] Over the next 45 days, patient will work with SW to address concerns related to home and family safety  CCM SW Interventions:  . Inter-disciplinary care team collaboration (see longitudinal plan of care) . Collaboration with [name], FNP regarding development and update of comprehensive plan of care as evidenced by provider attestation and co-signature . Inbound call received from [place] at [place] who reports the patients case has been accepted and assigned to a Education officer, museum . Discussed the assigned social worker is currently attempting a home visit and is unable to locate the patient due to insufficient address . Provided address in patient chart ; unfortunately address is missing apartment number .  Discussed the social worker may be able to locate an Radiographer, therapeutic for assistance . [place] reports the Education officer, museum will contact this BSW with an update once home visit is completed  Patient Goals/Self-Care Activities . Over the next 20  days, patient will:   - Patient will attend all scheduled provider appointments Engage with [place] as warranted once referral is placed  Follow Up Plan: The care management team will reach out to the patient again over the next 5 days.         Follow Up Plan: SW will collaborate with [place] as needed regarding case discussion.      [name], BSW, CDP Social Worker, Certified Dementia Practitioner [place] / [place] Management [phone]  Total time spent performing care coordination and/or care management activities with the patient by phone or face to face = 10 minutes.

## 2020-08-03 ENCOUNTER — Telehealth: Payer: Medicare Other

## 2020-08-03 ENCOUNTER — Ambulatory Visit: Payer: Self-pay

## 2020-08-03 ENCOUNTER — Ambulatory Visit (INDEPENDENT_AMBULATORY_CARE_PROVIDER_SITE_OTHER): Payer: Medicare Other

## 2020-08-03 DIAGNOSIS — F0391 Unspecified dementia with behavioral disturbance: Secondary | ICD-10-CM

## 2020-08-03 DIAGNOSIS — I10 Essential (primary) hypertension: Secondary | ICD-10-CM | POA: Diagnosis not present

## 2020-08-03 DIAGNOSIS — E782 Mixed hyperlipidemia: Secondary | ICD-10-CM

## 2020-08-03 DIAGNOSIS — E559 Vitamin D deficiency, unspecified: Secondary | ICD-10-CM

## 2020-08-03 NOTE — Patient Instructions (Signed)
   Goals we discussed today:  Goals Addressed            This Visit's Progress   . Home and Family Safety Maintained       Timeframe:  Short-Term Goal Priority:  High Start Date:  2.23.22                           Expected End Date: 4.9.22      Next planned outreach planned for: 3.8.22                   Patient Goals/Self-Care Activities . Over the next 20 days, patient will:   - Patient will attend all scheduled provider appointments Engage with [place] as warranted once referral is placed Engage with [place] to follow up on [place] benefits
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
## 2020-08-04 ENCOUNTER — Ambulatory Visit: Payer: Self-pay

## 2020-08-04 ENCOUNTER — Telehealth: Payer: Medicare Other

## 2020-08-04 ENCOUNTER — Ambulatory Visit (INDEPENDENT_AMBULATORY_CARE_PROVIDER_SITE_OTHER): Payer: Medicare Other | Admitting: Nurse Practitioner

## 2020-08-04 ENCOUNTER — Encounter: Payer: Self-pay | Admitting: Nurse Practitioner

## 2020-08-04 ENCOUNTER — Other Ambulatory Visit: Payer: Self-pay

## 2020-08-04 VITALS — BP 130/72 | HR 64 | Temp 97.9°F | Ht 67.2 in | Wt 141.6 lb

## 2020-08-04 DIAGNOSIS — I10 Essential (primary) hypertension: Secondary | ICD-10-CM

## 2020-08-04 DIAGNOSIS — Z122 Encounter for screening for malignant neoplasm of respiratory organs: Secondary | ICD-10-CM | POA: Diagnosis not present

## 2020-08-04 DIAGNOSIS — E782 Mixed hyperlipidemia: Secondary | ICD-10-CM

## 2020-08-04 DIAGNOSIS — R7303 Prediabetes: Secondary | ICD-10-CM | POA: Diagnosis not present

## 2020-08-04 DIAGNOSIS — R059 Cough, unspecified: Secondary | ICD-10-CM

## 2020-08-04 DIAGNOSIS — Z720 Tobacco use: Secondary | ICD-10-CM

## 2020-08-04 DIAGNOSIS — F3289 Other specified depressive episodes: Secondary | ICD-10-CM

## 2020-08-04 DIAGNOSIS — E559 Vitamin D deficiency, unspecified: Secondary | ICD-10-CM

## 2020-08-04 DIAGNOSIS — F0391 Unspecified dementia with behavioral disturbance: Secondary | ICD-10-CM | POA: Diagnosis not present

## 2020-08-04 MED ORDER — AMLODIPINE BESYLATE 5 MG PO TABS: 5.0000 mg | ORAL_TABLET | Freq: Every day | ORAL | 0 refills | Status: DC

## 2020-08-04 MED ORDER — VITAMIN D (ERGOCALCIFEROL) 1.25 MG (50000 UNIT) PO CAPS: 50000.0000 [IU] | ORAL_CAPSULE | ORAL | 1 refills | Status: DC

## 2020-08-04 MED ORDER — PRAVASTATIN SODIUM 20 MG PO TABS: 20.0000 mg | ORAL_TABLET | Freq: Every day | ORAL | 1 refills | Status: DC

## 2020-08-04 MED ORDER — NAMZARIC 28-10 MG PO CP24: 1.0000 | ORAL_CAPSULE | Freq: Every day | ORAL | 0 refills | Status: DC

## 2020-08-04 MED ORDER — ESCITALOPRAM OXALATE 5 MG PO TABS: 5.0000 mg | ORAL_TABLET | Freq: Every day | ORAL | 1 refills | Status: DC

## 2020-08-04 NOTE — Addendum Note (Signed)
Addended by: [name] F on: 08/04/2020 03:45 PM   Modules accepted: Orders

## 2020-08-04 NOTE — Progress Notes (Signed)
I,[name] as a Education administrator for [employer], FNP.,have documented all relevant documentation on the behalf of Eugene Brine, FNP,as directed by  Eugene Brine, FNP while in the presence of [name], [place]. This visit occurred during the SARS-CoV-2 public health emergency.  Safety protocols were in place, including screening questions prior to the visit, additional usage of staff PPE, and extensive cleaning of exam room while observing appropriate contact time as indicated for disinfecting solutions.  Subjective:     Patient ID: [name] , male    DOB: [birth year] , 75 y.o.   MRN: [id number]   Chief Complaint  Patient presents with  . Dementia  . Cough    Patient's wife stated he has had a dry cough for the past couple of weeks   . Hypertension    HPI  Patient presents today for a f/u on his level of care, dementia and medications.  She reports he lays around in the bed all day.  She reports he mostly eats junk food.  She is in communication with [name] and [name] about getting him some help.  She would like to get him moving around more.   Cough This is a new problem. The current episode started 1 to 4 weeks ago (weeks ago - worse at night). The cough is non-productive. Pertinent negatives include no fever. The symptoms are aggravated by lying down. He has tried nothing for the symptoms. There is no history of asthma.  Hypertension This is a chronic problem. The current episode started more than 1 year ago. The problem is unchanged. The problem is controlled. Pertinent negatives include no anxiety or blurred vision. There are no associated agents to hypertension. Past treatments include calcium channel blockers. There are no compliance problems.  There is no history of angina, kidney disease or CVA. There is no history of chronic renal disease.     Past Medical History:  Diagnosis Date  . Hyperlipidemia   . Hypertension      History reviewed. No pertinent family  history.   Current Outpatient Medications:  .  amLODipine (NORVASC) 5 MG tablet, Take 1 tablet (5 mg total) by mouth daily., Disp: 90 tablet, Rfl: 0 .  aspirin EC 81 MG tablet, Take 81 mg by mouth daily. (Patient not taking: No sig reported), Disp: , Rfl:  .  escitalopram (LEXAPRO) 5 MG tablet, Take 1 tablet (5 mg total) by mouth daily., Disp: 90 tablet, Rfl: 1 .  Memantine HCl-Donepezil HCl (NAMZARIC) 28-10 MG CP24, Take 1 capsule by mouth daily., Disp: 90 capsule, Rfl: 0 .  pravastatin (PRAVACHOL) 20 MG tablet, Take 1 tablet (20 mg total) by mouth daily., Disp: 90 tablet, Rfl: 1 .  rivastigmine (EXELON) 4.6 mg/24hr, APPLY ONCE DAILY TRANSDERMALLY (Patient not taking: No sig reported), Disp: 30 patch, Rfl: 3 .  Vitamin D, Ergocalciferol, (DRISDOL) 1.25 MG (50000 UNIT) CAPS capsule, Take 1 capsule (50,000 Units total) by mouth every 7 (seven) days., Disp: 12 capsule, Rfl: 1   No Known Allergies   Review of Systems  Constitutional: Negative.  Negative for fever.  HENT: Negative.   Eyes: Negative.  Negative for blurred vision.  Respiratory: Positive for cough.   Cardiovascular: Negative.   Gastrointestinal: Negative.   Endocrine: Negative.   Genitourinary: Negative.   Musculoskeletal: Negative.   Skin: Negative.   Neurological: Negative.   Hematological: Negative.   Psychiatric/Behavioral: Negative.      Today's Vitals   08/04/20 0918  BP: 130/72  Pulse: 64  Temp: 97.9 F (36.6 C)  TempSrc: Oral  Weight: 141 lb 9.6 oz (64.2 kg)  Height: 5' 7.2" (1.707 m)  PainSc: 0-No pain   Body mass index is 22.05 kg/m.   Objective:  Physical Exam Constitutional:      General: He is not in acute distress.    Appearance: Normal appearance. He is normal weight.  Cardiovascular:     Rate and Rhythm: Normal rate and regular rhythm.     Pulses: Normal pulses.     Heart sounds: Normal heart sounds. No murmur heard.   Pulmonary:     Effort: Pulmonary effort is normal.     Breath  sounds: Normal breath sounds.  Abdominal:     General: Abdomen is flat. Bowel sounds are normal.     Palpations: Abdomen is soft.  Musculoskeletal:        General: Normal range of motion.     Cervical back: Normal range of motion and neck supple.  Skin:    General: Skin is warm and dry.     Capillary Refill: Capillary refill takes less than 2 seconds.  Neurological:     General: No focal deficit present.     Mental Status: He is alert and oriented to person, place, and time.  Psychiatric:        Mood and Affect: Mood normal.        Behavior: Behavior normal.        Thought Content: Thought content normal.        Judgment: Judgment normal.         Assessment And Plan:     1. Essential hypertension . B/P is controlled.  . CMP ordered to check renal function.  . The importance of regular exercise and dietary modification was stressed to the patient.  . He has not received his amlodipine from November  - amLODipine (NORVASC) 5 MG tablet; Take 1 tablet (5 mg total) by mouth daily.  Dispense: 90 tablet; Refill: 0  2. Dementia with behavioral disturbance, unspecified dementia type ([place])  Chronic, he has not been taking his medication and his wife would like to get him restarted. She is having difficulty with applying the patch on him and would like to try the pills. Rx sent to pharmacy. I will reach out to pharmacy to see if can assist with medication adherence - Memantine HCl-Donepezil HCl (NAMZARIC) 28-10 MG CP24; Take 1 capsule by mouth daily.  Dispense: 90 capsule; Refill: 0  3. Prediabetes  Chronic, stable  Will not do labs this visit  Diet controlled  Encouraged to limit intake of sugary foods and drinks  Encouraged to increase physical activity to 150 minutes per week  4. Mixed hyperlipidemia  Chronic, controlled  Continue with current medications - pravastatin (PRAVACHOL) 20 MG tablet; Take 1 tablet (20 mg total) by mouth daily.  Dispense: 90 tablet; Refill:  1  5. Vitamin D deficiency  Will check vitamin D level and supplement as needed.     Also encouraged to spend 15 minutes in the sun daily.  - Vitamin D, Ergocalciferol, (DRISDOL) 1.25 MG (50000 UNIT) CAPS capsule; Take 1 capsule (50,000 Units total) by mouth every 7 (seven) days.  Dispense: 12 capsule; Refill: 1  6. Other depression  Will restart his escitalopram - escitalopram (LEXAPRO) 5 MG tablet; Take 1 tablet (5 mg total) by mouth daily.  Dispense: 90 tablet; Refill: 1  7. Cough  2 week history of cough, will check for covid  I have  encouraged his wife to get his covid booster in the next couple weeks - Novel Coronavirus, NAA ([facility])   8. Tobacco abuse Smoking cessation instruction/counseling given:  counseled patient on the dangers of tobacco use, advised patient to stop smoking, and reviewed strategies to maximize success    Patient was given opportunity to ask questions. Patient verbalized understanding of the plan and was able to repeat key elements of the plan. All questions were answered to their satisfaction.  Eugene Brine, FNP   I, Eugene Brine, FNP, have reviewed all documentation for this visit. The documentation on 08/04/20 for the exam, diagnosis, procedures, and orders are all accurate and complete.   THE PATIENT IS ENCOURAGED TO PRACTICE SOCIAL DISTANCING DUE TO THE COVID-19 PANDEMIC.

## 2020-08-04 NOTE — Patient Instructions (Signed)
HBP corricidan cough syrup as needed due to history of hypertension.  Will also give samples of zyrtec to take at night.

## 2020-08-05 ENCOUNTER — Ambulatory Visit: Payer: Medicare Other

## 2020-08-05 ENCOUNTER — Telehealth: Payer: Self-pay | Admitting: *Deleted

## 2020-08-05 DIAGNOSIS — F0391 Unspecified dementia with behavioral disturbance: Secondary | ICD-10-CM

## 2020-08-05 DIAGNOSIS — I10 Essential (primary) hypertension: Secondary | ICD-10-CM

## 2020-08-05 LAB — NOVEL CORONAVIRUS, NAA: SARS-CoV-2, NAA: NOT DETECTED

## 2020-08-05 LAB — SARS-COV-2, NAA 2 DAY TAT

## 2020-08-05 NOTE — Patient Instructions (Signed)
Goals Addressed    . Manage My Medicine   On track    Timeframe:  Short-Term Goal Priority:  High Start Date: 08/03/20                            Expected End Date:  10/05/20                     Follow Up Date: 08/14/20   - call for medicine refill 2 or 3 days before it runs out - call if I am sick and can't take my medicine - keep a list of all the medicines I take; vitamins and herbals too - learn to read medicine labels - use a pillbox to sort medicine - use an alarm clock or phone to remind me to take my medicine  - Collaborate with the embedded Pharm D regarding assistance needed for med cost of Namzaric   Why is this important?   . These steps will help you keep on track with your medicines.   Notes:     . [facility] Established   On track    Timeframe:  Short-Term Goal Priority:  High Start Date:  08/03/20                           Expected End Date:  10/05/20  Next Follow up date:  08/14/20    - Contact the [place] office as directed for assistance with applying for appropriate VA benefits - Contact the [facility] in [place], [place] to scheduled dental and vision appointments  - Keep all scheduled MD follow up appointments - Call the CCM team and or PCP for questions or concerns and or care coordination needs

## 2020-08-05 NOTE — Chronic Care Management (AMB) (Signed)
  Chronic Care Management   Outreach Note  08/05/2020 Name: [name] MRN: [id number] DOB: 29-Aug-1945  [name] is a 75 y.o. year old male who is a primary care patient of [name], [place]. I reached out to [name] by phone today in response to a referral sent by [name] PCP, [name], FNP.    An unsuccessful telephone outreach was attempted today. The patient was referred to the case management team for assistance with care management and care coordination.   Follow Up Plan: A HIPAA compliant phone message was left for the patient providing contact information and requesting a return call. The care management team will reach out to the patient again over the next 3-5 days. If patient returns call to provider office, please advise to call [place] at [phone].  [facility]
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
## 2020-08-05 NOTE — Chronic Care Management (AMB) (Signed)
Chronic Care Management    Social Work Note  08/05/2020 Name: [name] MRN: [id number] DOB: [birth year]  [name] is a 75 y.o. year old male who is a primary care patient of [name], [place]. The CCM team was consulted to assist the patient with chronic disease management and/or care coordination needs related to: Level of Care Concerns.   Collaboration with [facility] APS for coordination of care in response to provider referral for social work chronic care management and care coordination services.   Consent to Services:  The patient was given information about Chronic Care Management services, agreed to services, and gave verbal consent prior to initiation of services.  Please see initial visit note for detailed documentation.   Patient agreed to services and consent obtained.   Assessment: Review of patient past medical history, allergies, medications, and health status, including review of relevant consultants reports was performed today as part of a comprehensive evaluation and provision of chronic care management and care coordination services.     SDOH (Social Determinants of Health) assessments and interventions performed:    Advanced Directives Status: Not addressed in this encounter.  CCM Care Plan  No Known Allergies  Outpatient Encounter Medications as of 08/05/2020  Medication Sig  . amLODipine (NORVASC) 5 MG tablet Take 1 tablet (5 mg total) by mouth daily.  [name] aspirin EC 81 MG tablet Take 81 mg by mouth daily. (Patient not taking: No sig reported)  . escitalopram (LEXAPRO) 5 MG tablet Take 1 tablet (5 mg total) by mouth daily.  . Memantine HCl-Donepezil HCl (NAMZARIC) 28-10 MG CP24 Take 1 capsule by mouth daily.  . pravastatin (PRAVACHOL) 20 MG tablet Take 1 tablet (20 mg total) by mouth daily.  . Vitamin D, Ergocalciferol, (DRISDOL) 1.25 MG (50000 UNIT) CAPS capsule Take 1 capsule (50,000 Units total) by mouth every 7 (seven) days.   No  facility-administered encounter medications on file as of 08/05/2020.    There are no problems to display for this patient.   Conditions to be addressed/monitored: HTN and Dementia; Family and relationship dysfunction  Care Plan : Social Work [facility] Care Plan  Updates made by [name] since 08/05/2020 12:00 AM    Problem: Home and Family Safety (Wellness)     Goal: Home and Family Safety Maintained   Start Date: 07/22/2020  Expected End Date: 09/05/2020  Recent Progress: On track  Priority: High  Note:   Current Barriers:  . Chronic disease management support and education needs related to HTN and Dementia  . Limited social support . Level of care concerns . Family and relationship dysfunction . Social Isolation . Memory Deficits  Social Worker Clinical Goal(s):   [name] Over the next 45 days, patient will work with SW to address concerns related to home and family safety  CCM SW Interventions:  . Inter-disciplinary care team collaboration (see longitudinal plan of care) . Collaboration with [name], FNP regarding development and update of comprehensive plan of care as evidenced by provider attestation and co-signature . Successful outbound call placed to [facility] to determined the patients assigned caseworker is [name] (Direct dial: [phone]) . Requested status update - SW informed most information is not able to be disclosed  . Voice message left for patients assigned APS caseworker requesting a return call to discuss plans for patient re: placement plans  Patient Goals/Self-Care Activities . Over the next 20 days, patient will: With the help of his spouse  - Patient will  attend all scheduled provider appointments Engage with [place] as warranted once referral is placed Engage with [place] to determine [place] benefits  Follow Up Plan: The care management team will reach out to the patient again over the next 7 days.          Follow Up Plan: SW will follow up with patient by phone over the next 7 days.      [name], BSW, CDP Social Worker, Certified Dementia Practitioner [place] [facility] [place] [facility] [phone]  Total time spent performing care coordination and/or care management activities with the patient by phone or face to face = 15 minutes.
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
## 2020-08-05 NOTE — Patient Instructions (Signed)
Goals Addressed    . Manage My Medicine   Not on track    Timeframe:  Short-Term Goal Priority:  High Start Date: 08/03/20                            Expected End Date:  10/05/20                     Follow Up Date: 08/14/20   - call for medicine refill 2 or 3 days before it runs out - call if I am sick and can't take my medicine - keep a list of all the medicines I take; vitamins and herbals too - learn to read medicine labels - use a pillbox to sort medicine - use an alarm clock or phone to remind me to take my medicine    Why is this important?   . These steps will help you keep on track with your medicines.   Notes:     . Therapeutic Alliance Established   On track    Timeframe:  Short-Term Goal Priority:  High Start Date:  08/03/20                           Expected End Date:  10/05/20  Next Follow up date:  08/14/20    . Keep scheduled PCP OV scheduled for 08/04/20 @9 :15 am  . Discuss care needs and concerns with PCP during visit . Make sure to ask PCP to refill all prescribed medications as detremined by PCP and resume such medications as prescribed . Call the CCM team and or PCP for questions or concerns and or care coordination needs

## 2020-08-06 ENCOUNTER — Ambulatory Visit: Payer: Self-pay

## 2020-08-06 DIAGNOSIS — I10 Essential (primary) hypertension: Secondary | ICD-10-CM

## 2020-08-06 DIAGNOSIS — F0391 Unspecified dementia with behavioral disturbance: Secondary | ICD-10-CM

## 2020-08-06 NOTE — Chronic Care Management (AMB) (Signed)
Chronic Care Management    Social Work Note  08/06/2020 Name: [name] MRN: [id number] DOB: 07/10/1945  [name] is a 75 y.o. year old male who is a primary care patient of [name], [place]. The CCM team was consulted to assist the patient with chronic disease management and/or care coordination needs related to: Level of Care Concerns.   Engaged with patient spouse [name] by phone for follow up visit in response to provider referral for social work chronic care management and care coordination services.   Consent to Services:  The patient was given information about Chronic Care Management services, agreed to services, and gave verbal consent prior to initiation of services.  Please see initial visit note for detailed documentation.   Patient agreed to services and consent obtained.   Assessment: Review of patient past medical history, allergies, medications, and health status, including review of relevant consultants reports was performed today as part of a comprehensive evaluation and provision of chronic care management and care coordination services.     SDOH (Social Determinants of Health) assessments and interventions performed:    Advanced Directives Status: Not addressed in this encounter.  CCM Care Plan  No Known Allergies  Outpatient Encounter Medications as of 08/06/2020  Medication Sig   amLODipine (NORVASC) 5 MG tablet Take 1 tablet (5 mg total) by mouth daily.   aspirin EC 81 MG tablet Take 81 mg by mouth daily. (Patient not taking: No sig reported)   escitalopram (LEXAPRO) 5 MG tablet Take 1 tablet (5 mg total) by mouth daily.   Memantine HCl-Donepezil HCl (NAMZARIC) 28-10 MG CP24 Take 1 capsule by mouth daily.   pravastatin (PRAVACHOL) 20 MG tablet Take 1 tablet (20 mg total) by mouth daily.   Vitamin D, Ergocalciferol, (DRISDOL) 1.25 MG (50000 UNIT) CAPS capsule Take 1 capsule (50,000 Units total) by mouth every 7 (seven) days.   No  facility-administered encounter medications on file as of 08/06/2020.    There are no problems to display for this patient.   Conditions to be addressed/monitored: HTN and Dementia; Level of care concerns  Care Plan : Social Work [facility] Care Plan  Updates made by [name] since 08/06/2020 12:00 AM    Problem: Disease Progression - Dementia     Long-Range Goal: Disease Progression Managed with Education and Resources   Start Date: 07/15/2020  Expected End Date: 11/12/2020  Priority: High  Note:   Current Barriers:   Level of care concerns  Memory Deficits  Chronic conditions including HTN and Dementia  Social Work Clinical Goal(s):   Over the next 120 days the patient and his spouse will work with SW to identify resources to assist with disease progression  Interventions:  1:1 collaboration with [name], [place] regarding development and update of comprehensive plan of care as evidenced by provider attestation and co-signature  Inter-disciplinary care team collaboration (see longitudinal plan of care)  Successful outbound call placed to the patients spouse, Elester Apodaca in response to a voice message received  Discussed Mrs. Christmas would like to speak with embedded PharmD regarding medication costs  Performed chart review to note unsuccessful call placed to the patient on 3.9.22 by Laverda Sorenson to schedule an initial appointment with Pharmacist  Encouraged Mrs. Leach to return call to [facility] in order to meet with pharmacist regarding pharmacy related questions  Mrs. Hoak stated she will plan to pick up newly filled prescriptions today and speak with Vallie at a later time regarding medication  costs  Assessed for outcome of call to [facility] office - Mrs. Shaff reports she has yet to call this resource as she is having a very emotional day and can't get her thoughts together  Encouraged Mrs. Clayson to focus on what she can accomplish today which is picking up  medications and returning [name]'s call regarding pharmacy scheduling  Collaboration with [place] regarding interventions and plan  Patient Goals/Self-Care Activities Over the next 30 days, patient will: With the help of his spouse  - Patient will call provider office for new concerns or questions Contact SW as needed prior to next scheduled call Pick up prescriptions from pharmacy Return call to Laverda Sorenson, scheduling care guide to obtain an appointment with embedded pharmacist  Follow up Plan: SW will follow up with patient by phone over the next 7 days       Follow Up Plan: SW will follow up with patient by phone over the next 7 days.      [name], BSW, CDP Social Worker, Certified Dementia Practitioner [place] [facility] [place] [facility] [phone]  Total time spent performing care coordination and/or care management activities with the patient by phone or face to face = 16 minutes.

## 2020-08-06 NOTE — Patient Instructions (Signed)
   Goals we discussed today:  Goals Addressed            This Visit's Progress   . Disease Progression Managed       Timeframe:  Long-Range Goal Priority:  High Start Date:     2.16.22                        Expected End Date: 6.16.22                      Next planned outreach date: 3.16.22  Patient Goals/Self-Care Activities Over the next 30 days, patient will: With the help of his spouse  - Patient will call provider office for new concerns or questions Contact SW as needed prior to next scheduled call Pick up prescriptions from pharmacy Return call to Laverda Sorenson, scheduling care guide to obtain an appointment with embedded pharmacist

## 2020-08-07 NOTE — Chronic Care Management (AMB) (Signed)
  Chronic Care Management   Note  08/07/2020 Name: [name] MRN: [id number] DOB: 01-20-1946  [name] is a 75 y.o. year old male who is a primary care patient of [name], [place]. I reached out to Corky Sox by phone today in response to a referral sent by [name]'s PCP, [name], FNP.      [name] was given information about Chronic Care Management services today including:  1. CCM service includes personalized support from designated clinical staff supervised by his physician, including individualized plan of care and coordination with other care providers 2. 24/7 contact phone numbers for assistance for urgent and routine care needs. 3. Service will only be billed when office clinical staff spend 20 minutes or more in a month to coordinate care. 4. Only one practitioner may furnish and bill the service in a calendar month. 5. The patient may stop CCM services at any time (effective at the end of the month) by phone call to the office staff. 6. The patient will be responsible for cost sharing (co-pay) of up to 20% of the service fee (after annual deductible is met).  Patient agreed to services and verbal consent obtained.   Follow up plan: Telephone appointment with care management team member scheduled for:08/13/2020  [place] [facility]

## 2020-08-11 ENCOUNTER — Telehealth: Payer: Self-pay

## 2020-08-11 ENCOUNTER — Ambulatory Visit: Payer: Self-pay

## 2020-08-11 ENCOUNTER — Telehealth: Payer: Medicare Other

## 2020-08-11 DIAGNOSIS — F0391 Unspecified dementia with behavioral disturbance: Secondary | ICD-10-CM

## 2020-08-11 DIAGNOSIS — I10 Essential (primary) hypertension: Secondary | ICD-10-CM

## 2020-08-11 DIAGNOSIS — E559 Vitamin D deficiency, unspecified: Secondary | ICD-10-CM

## 2020-08-11 NOTE — Chronic Care Management (AMB) (Signed)
    Chronic Care Management Pharmacy Assistant   Name: [name]  MRN: [id number] DOB: [birth year]    Reason for Encounter: Medication Review/Initial Questions for Pharmacist visit on 03/15//2022.    Recent office visits:  08/04/20-[name], [name], [name] (OV). 08/04/20-[name], [name], RN (CCM). 08/05/20-[name], [name] (CCM). 08/06/20-[name], [name] (CCM).   Recent consult visits:  None.  Hospital visits:  None in previous 6 months   Medication Changes Indicated: 08/04/20-Ergocalciferol 50,000 Units Oral Every 7 days (change in therapy). Rivastigmine 4.6 mg/24 hr-Discontinued by provider).  Medications: Outpatient Encounter Medications as of 08/11/2020  Medication Sig  . amLODipine (NORVASC) 5 MG tablet Take 1 tablet (5 mg total) by mouth daily.  [name] aspirin EC 81 MG tablet Take 81 mg by mouth daily. (Patient not taking: No sig reported)  . escitalopram (LEXAPRO) 5 MG tablet Take 1 tablet (5 mg total) by mouth daily.  . Memantine HCl-Donepezil HCl (NAMZARIC) 28-10 MG CP24 Take 1 capsule by mouth daily.  . pravastatin (PRAVACHOL) 20 MG tablet Take 1 tablet (20 mg total) by mouth daily.  . Vitamin D, Ergocalciferol, (DRISDOL) 1.25 MG (50000 UNIT) CAPS capsule Take 1 capsule (50,000 Units total) by mouth every 7 (seven) days.   No facility-administered encounter medications on file as of 08/11/2020.    Have you seen any other providers since your last visit? Patient voiced no.   Any changes in your medications or health? Patient voiced there are no changes.   Any side effects from any medications?  Patient voiced no.  Do you have an symptoms or problems not managed by your medications? Patient voiced no.  Any concerns about your health right now? Patient stated he has no concerns.  Has your provider asked that you check blood pressure, blood sugar, or follow special diet at home? Patient stated no he has not been asked by his provider to do any of the following, check blood  pressure, blood sugar or follow a special diet.  Do you get any type of exercise on a regular basis?  Patient stated no, not really.  Can you think of a goal you would like to reach for your health? Patient stated he hasn't thought about it.  Do you have any problems getting your medications? Patient stated no he does not have any problems with getting his medications..  Is there anything that you would like to discuss during the appointment? Patient stated no there is nothing he would like to discuss at this time.  Please bring medications and supplements to appointment. Patient aware to have his medications and supplements near during phone call.   Star Rating Drugs: Pravastatin 20 MG: #90 DS, last filled 05/01/20 at [facility].  08/11/20-Attempted multiple times to call and speak with the patient due to patient's phone calls air drop. Will attempt to contact the patient tomorrow 08/12/20.  08/12/20-Successful call to the patient. Patient voiced he will be waiting on call from Orlando Penner, CPP tomorrow 08/13/20 at 11:00 AM.  Orlando Penner, CPP Notified.   Raynelle Highland, [place] Pharmacist Assistant [phone] CCM Total Time:26 minutes

## 2020-08-12 ENCOUNTER — Ambulatory Visit: Payer: Medicare Other

## 2020-08-12 DIAGNOSIS — F0391 Unspecified dementia with behavioral disturbance: Secondary | ICD-10-CM

## 2020-08-12 DIAGNOSIS — I10 Essential (primary) hypertension: Secondary | ICD-10-CM

## 2020-08-12 NOTE — Patient Instructions (Signed)
Social Worker Visit Information  Goals we discussed today:  Goals Addressed            This Visit's Progress   . Home and Family Safety Maintained       Timeframe:  Short-Term Goal Priority:  High Start Date:  2.23.22                           Expected End Date: 4.9.22      Next planned outreach planned for: 4.4.22                   Patient Goals/Self-Care Activities . Over the next 20 days, patient will:   - Patient will attend all scheduled provider appointments Engage with [place] as warranted once referral is placed Engage with [facility] in [place] to establish care        Materials Provided: Verbal education about [employer] provided by phone  Follow Up Plan: SW will follow up with patient by phone over the next 21 days.  [name], BSW, CDP Social Worker, Certified Dementia Practitioner [place] [facility] [place] [facility] [phone]

## 2020-08-12 NOTE — Patient Instructions (Signed)
Goals Addressed    . Manage My Medicine   On track    Timeframe:  Short-Term Goal Priority:  High Start Date: 08/03/20                            Expected End Date:  10/05/20                     Follow Up Date: 08/14/20   - call for medicine refill 2 or 3 days before it runs out - call if I am sick and can't take my medicine - keep a list of all the medicines I take; vitamins and herbals too - learn to read medicine labels - use a pillbox to sort medicine - use an alarm clock or phone to remind me to take my medicine  - Collaborate with the embedded Pharm D regarding assistance needed for med cost of Namzaric   Why is this important?   . These steps will help you keep on track with your medicines.   Notes:

## 2020-08-12 NOTE — Chronic Care Management (AMB) (Signed)
Chronic Care Management   CCM RN Visit Note  08/11/2020 Name: [name] MRN: [id number] DOB: January 12, 1946  Subjective: [name] is a 75 y.o. year old male who is a primary care patient of [name], [place]. The care management team was consulted for assistance with disease management and care coordination needs.    Engaged with patient by telephone for follow up visit in response to provider referral for case management and/or care coordination services.   Consent to Services:  The patient was given information about Chronic Care Management services, agreed to services, and gave verbal consent prior to initiation of services.  Please see initial visit note for detailed documentation.   Patient agreed to services and verbal consent obtained.   Assessment: Review of patient past medical history, allergies, medications, health status, including review of consultants reports, laboratory and other test data, was performed as part of comprehensive evaluation and provision of chronic care management services.   SDOH (Social Determinants of Health) assessments and interventions performed:  Yes  CCM Care Plan  No Known Allergies  Outpatient Encounter Medications as of 08/11/2020  Medication Sig  . amLODipine (NORVASC) 5 MG tablet Take 1 tablet (5 mg total) by mouth daily.  [name] aspirin EC 81 MG tablet Take 81 mg by mouth daily. (Patient not taking: No sig reported)  . escitalopram (LEXAPRO) 5 MG tablet Take 1 tablet (5 mg total) by mouth daily.  . Memantine HCl-Donepezil HCl (NAMZARIC) 28-10 MG CP24 Take 1 capsule by mouth daily.  . pravastatin (PRAVACHOL) 20 MG tablet Take 1 tablet (20 mg total) by mouth daily.  . Vitamin D, Ergocalciferol, (DRISDOL) 1.25 MG (50000 UNIT) CAPS capsule Take 1 capsule (50,000 Units total) by mouth every 7 (seven) days.   No facility-administered encounter medications on file as of 08/11/2020.    There are no problems to display for this  patient.   Conditions to be addressed/monitored:HTN, Dementia, Vitamin D deficiency   Care Plan : Medication Adherence  Updates made by Eugene Logan, RN since 08/12/2020 12:00 AM    Problem: Medication Adherence (Wellness)   Priority: High    Goal: Medication Adherence Maintained   Start Date: 08/03/2020  Expected End Date: 10/05/2020  This Visit's Progress: On track  Recent Progress: Not on track  Priority: High  Note:   Current Barriers:   Ineffective Self Health Maintenance  Does not adhere to prescribed medication regimen  Lacks social connections  Does not maintain contact with provider office Clinical Goal(s):  [name] Collaboration with Eugene Brine, FNP regarding development and update of comprehensive plan of care as evidenced by provider attestation and co-signature . Inter-disciplinary care team collaboration (see longitudinal plan of care)  patient will work with care management team to address care coordination and chronic disease management needs related to Disease Management  Educational Needs  Care Coordination  Medication Management and Education  Medication Reconciliation  Psychosocial Support  Dementia and Caregiver Support  Level of Care Concerns   Interventions:  08/11/20 completed inbound call with wife [name]   Evaluation of current treatment plan related to Non-adherence to medication regimen self-management and patient's adherence to plan as established by provider.  Collaboration with Eugene Brine, FNP regarding development and update of comprehensive plan of care as evidenced by provider attestation       and co-signature  Inter-disciplinary care team collaboration (see longitudinal plan of care)  Determined [name] has all prescribed medications on hand at this time, although [name] continues to report financial hardship paying for a few of the medications  Determined [name] is now administering [name] medications  and ensuring that he is getting them down and taking them on the prescribed schedule  Discussed with [name], her upcoming telephone appointment with the embedded Pharm D to discuss pharmacy needs and explore assistance for medication cost  . Discussed plans with patient for ongoing care management follow up and provided patient with direct contact information for care management team Patient [name] Activities:  . - call for medicine refill 7 days before it runs out . - call if I am sick and can't take my medicine . - keep a list of all the medicines I take; vitamins and herbals too . - learn to read medicine labels . - use a pillbox to sort medicine . - use an alarm clock or phone to remind me to take my medicine . - Collaborate with the embedded Pharm D regarding assistance needed for med cost of Namzaric   Follow Up Plan: Telephone follow up appointment with care management team member scheduled for: 08/14/20    Plan:Telephone follow up appointment with care management team member scheduled for:  08/14/20  Barb Merino, RN, BSN, CCM Care Management Coordinator [place] [facility]  Direct Phone: [phone]

## 2020-08-12 NOTE — Chronic Care Management (AMB) (Signed)
Chronic Care Management    Social Work Note  08/12/2020 Name: [name] MRN: [id number] DOB: 1945/11/10  [name] is a 75 y.o. year old male who is a primary care patient of [name], [place]. The CCM team was consulted to assist the patient with chronic disease management and/or care coordination needs related to: Level of Care Concerns.   Engaged with patient spouse by phone for follow up visit in response to provider referral for social work chronic care management and care coordination services.   Consent to Services:  The patient was given information about Chronic Care Management services, agreed to services, and gave verbal consent prior to initiation of services.  Please see initial visit note for detailed documentation.   Patient agreed to services and consent obtained.   Assessment: Review of patient past medical history, allergies, medications, and health status, including review of relevant consultants reports was performed today as part of a comprehensive evaluation and provision of chronic care management and care coordination services.     SDOH (Social Determinants of Health) assessments and interventions performed:    Advanced Directives Status: Not addressed in this encounter.  CCM Care Plan  No Known Allergies  Outpatient Encounter Medications as of 08/12/2020  Medication Sig  . amLODipine (NORVASC) 5 MG tablet Take 1 tablet (5 mg total) by mouth daily.  [name] aspirin EC 81 MG tablet Take 81 mg by mouth daily. (Patient not taking: No sig reported)  . escitalopram (LEXAPRO) 5 MG tablet Take 1 tablet (5 mg total) by mouth daily.  . Memantine HCl-Donepezil HCl (NAMZARIC) 28-10 MG CP24 Take 1 capsule by mouth daily.  . pravastatin (PRAVACHOL) 20 MG tablet Take 1 tablet (20 mg total) by mouth daily.  . Vitamin D, Ergocalciferol, (DRISDOL) 1.25 MG (50000 UNIT) CAPS capsule Take 1 capsule (50,000 Units total) by mouth every 7 (seven) days.   No  facility-administered encounter medications on file as of 08/12/2020.    There are no problems to display for this patient.   Conditions to be addressed/monitored: HTN and Dementia; Level of care concerns and Memory Deficits  Care Plan : Social Work [facility] Care Plan  Updates made by [name] since 08/12/2020 12:00 AM    Problem: Home and Family Safety (Wellness)     Goal: Home and Family Safety Maintained   Start Date: 07/22/2020  Expected End Date: 09/05/2020  Recent Progress: On track  Priority: High  Note:   Current Barriers:  . Chronic disease management support and education needs related to HTN and Dementia  . Limited social support . Level of care concerns . Family and relationship dysfunction . Social Isolation . Memory Deficits  Social Worker Clinical Goal(s):   [name] Over the next 45 days, patient will work with SW to address concerns related to home and family safety  CCM SW Interventions:  . Inter-disciplinary care team collaboration (see longitudinal plan of care) . Collaboration with [name], FNP regarding development and update of comprehensive plan of care as evidenced by provider attestation and co-signature . Successful outbound call placed to the patients spouse to assess status of recent interaction with APS . Discussed Mrs. Nobbe has not yet heard back from caseworker but she does not intend to place the patient . Determined Mrs. Brander has been administering patients medications daily with patient compliance . Discussed Mrs. Halls has plans to link patient to the [place] for ongoing medical care with hopes of caregiver assistance regarding patient care needs in  the home . Scheduled follow up call over the next 21 days  Patient Goals/Self-Care Activities . Over the next 20 days, patient will: With the help of his spouse  - Patient will attend all scheduled provider appointments Engage with [facility] as warranted once referral is placed Engage with  [facility] in [place] to establish care  Follow Up Plan: The care management team will reach out to the patient again over the next 21 days.         Follow Up Plan: SW will follow up with patient by phone over the next 21 days.      [name], BSW, CDP Social Worker, Certified Dementia Practitioner [place] [facility] [place] [facility] [phone]  Total time spent performing care coordination and/or care management activities with the patient by phone or face to face = 20 minutes.

## 2020-08-13 ENCOUNTER — Ambulatory Visit: Payer: Medicare Other

## 2020-08-13 DIAGNOSIS — I10 Essential (primary) hypertension: Secondary | ICD-10-CM

## 2020-08-13 DIAGNOSIS — E559 Vitamin D deficiency, unspecified: Secondary | ICD-10-CM

## 2020-08-13 DIAGNOSIS — E782 Mixed hyperlipidemia: Secondary | ICD-10-CM

## 2020-08-13 NOTE — Progress Notes (Signed)
Chronic Care Management Pharmacy Note  08/18/2020 Name:  [name] MRN:  [id number] DOB:  1945-10-11  Subjective: [name] is an 75 y.o. year old male who is a primary patient of [name], [place].  The CCM team was consulted for assistance with disease management and care coordination needs.  He was in the [employer] for many years. 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 He reports now he does not do to much he lays around and drinks coffee. Patient reports he has 3 sons and a couple of grand children.   Engaged with patient by telephone for initial visit in response to provider referral for pharmacy case management and/or care coordination services.   Consent to Services:  The patient was given the following information about Chronic Care Management services today, agreed to services, and gave verbal consent: 1. CCM service includes personalized support from designated clinical staff supervised by the primary care provider, including individualized plan of care and coordination with other care providers 2. 24/7 contact phone numbers for assistance for urgent and routine care needs. 3. Service will only be billed when office clinical staff spend 20 minutes or more in a month to coordinate care. 4. Only one practitioner may furnish and bill the service in a calendar month. 5.The patient may stop CCM services at any time (effective at the end of the month) by phone call to the office staff. 6. The patient will be responsible for cost sharing (co-pay) of up to 20% of the service fee (after annual deductible is met). Patient agreed to services and consent obtained.  Patient Care Team: [name], FNP as PCP - General (General Practice) Rex Kras, [name], RN as Case Manager [name] as Social Worker Mayford Knife, [facility] (Pharmacist)  Recent office visits: 08/04/2020 PCP [facility] visits: None in previous 6 months  Objective:  Lab Results  Component Value Date    CREATININE 1.07 04/08/2020   BUN 17 04/08/2020   GFRNONAA 68 04/08/2020   GFRAA 78 04/08/2020   NA 143 04/08/2020   K 5.2 04/08/2020   CALCIUM 9.8 04/08/2020   CO2 24 04/08/2020   GLUCOSE 96 04/08/2020    Lab Results  Component Value Date/Time   HGBA1C 5.5 04/08/2020 12:41 PM   HGBA1C 5.7 (H) 07/26/2018 12:56 PM   MICROALBUR 30 08/09/2018 01:01 PM    Last diabetic Eye exam: No results found for: HMDIABEYEEXA  Last diabetic Foot exam: No results found for: HMDIABFOOTEX   Lab Results  Component Value Date   CHOL 201 (H) 04/08/2020   HDL 43 04/08/2020   LDLCALC 145 (H) 04/08/2020   TRIG 71 04/08/2020   CHOLHDL 4.7 04/08/2020    Hepatic Function Latest Ref Rng & Units 04/08/2020  Total Protein 6.0 - 8.5 g/dL 7.7  Albumin 3.7 - 4.7 g/dL 4.4  AST 0 - 40 IU/L 11  ALT 0 - 44 IU/L 13  Alk Phosphatase 44 - 121 IU/L 62  Total Bilirubin 0.0 - 1.2 mg/dL 0.3    No results found for: TSH, FREET4  CBC Latest Ref Rng & Units 04/08/2020  WBC 3.4 - 10.8 x10E3/uL 6.6  Hemoglobin 13.0 - 17.7 g/dL 13.8  Hematocrit 37.5 - 51.0 % 42.1  Platelets 150 - 450 x10E3/uL 260    Lab Results  Component Value Date/Time   VD25OH 61.5 04/08/2020 12:41 PM   VD25OH 4.7 (L) 07/26/2018 12:56 PM    Clinical ASCVD: No  The 10-year ASCVD  risk score Mikey Bussing DC Jr., et al., 2013) is: 37.3%   Values used to calculate the score:     Age: 67 years     Sex: Male     Is Non-Hispanic African American: Yes     Diabetic: No     Tobacco smoker: Yes     Systolic Blood Pressure: 627 mmHg     Is BP treated: Yes     HDL Cholesterol: 43 mg/dL     Total Cholesterol: 201 mg/dL    Depression screen [facility] 2/9 04/08/2020 04/08/2020 08/09/2018  Decreased Interest 0 0 0  Down, Depressed, Hopeless 0 0 0  PHQ - 2 Score 0 0 0    Social History   Tobacco Use  Smoking Status Current Every Day Smoker  . Packs/day: 0.50  . Years: 53.00  . Pack years: 26.50  . Types: Cigarettes  Smokeless Tobacco Never Used   BP  Readings from Last 3 Encounters:  08/04/20 130/72  04/08/20 (!) 142/84  04/08/20 (!) 142/84   Pulse Readings from Last 3 Encounters:  08/04/20 64  04/08/20 63  04/08/20 63   Wt Readings from Last 3 Encounters:  08/04/20 141 lb 9.6 oz (64.2 kg)  04/08/20 138 lb (62.6 kg)  04/08/20 138 lb (62.6 kg)   BMI Readings from Last 3 Encounters:  08/04/20 22.05 kg/m  04/08/20 21.49 kg/m  04/08/20 21.49 kg/m    Assessment/Interventions: Review of patient past medical history, allergies, medications, health status, including review of consultants reports, laboratory and other test data, was performed as part of comprehensive evaluation and provision of chronic care management services.   SDOH:  (Social Determinants of Health) assessments and interventions performed: Yes    CCM Care Plan  No Known Allergies  Medications Reviewed Today    Reviewed by [name], FNP (Family Nurse Practitioner) on 08/04/20 at [phone]  Med List Status: <None>  Medication Order Taking? Sig Documenting Provider Last Dose Status Informant  amLODipine (NORVASC) 5 MG tablet [id number] No Take 1 tablet (5 mg total) by mouth daily.  Patient not taking: No sig reported   [name], FNP Not Taking Active   aspirin EC 81 MG tablet [id number] No Take 81 mg by mouth daily.  Patient not taking: No sig reported   [provider] Not Taking Active   escitalopram (LEXAPRO) 5 MG tablet [id number] No TAKE 1 TABLET DAILY  Patient not taking: No sig reported   [name], FNP Not Taking Active   Memantine HCl-Donepezil HCl (NAMZARIC) 28-10 MG CP24 [id number] No Take 1 capsule by mouth daily.  Patient not taking: No sig reported   [name], FNP Not Taking Active   pravastatin (PRAVACHOL) 20 MG tablet [id number] No Take 1 tablet (20 mg total) by mouth daily.  Patient not taking: No sig reported   [name], FNP Not Taking Active   rivastigmine (EXELON) 4.6 mg/24hr [id number] No APPLY ONCE DAILY TRANSDERMALLY   Patient not taking: No sig reported   [name], FNP Not Taking Active   Vitamin D, Ergocalciferol, (DRISDOL) 1.25 MG (50000 UT) CAPS capsule [id number] No TAKE 1 CAPSULE TWICE A WEEK  Patient not taking: No sig reported   [name], FNP Not Taking Active           There are no problems to display for this patient.   Immunization History  Administered Date(s) Administered  . Fluad Quad(high Dose 65+) 04/08/2020  . PFIZER(Purple Top)SARS-COV-2 Vaccination 07/23/2019, 08/13/2019    Conditions to  be addressed/monitored:  Hypertension, Hyperlipidemia and Vitamin D Deficiency  Care Plan : CCM Pharmacy Care Plan  Updates made by Mayford Knife, RPH since 08/18/2020 12:00 AM    Problem: HTN, HLD, Vitamin D Deficiency, Tobacc Use   Priority: High    Long-Range Goal: Disease Management   Start Date: 08/13/2020  This Visit's Progress: On track  Priority: High  Note:     Current Barriers:  . Unable to self administer medications as prescribed . Does not adhere to prescribed medication regimen  Pharmacist Clinical Goal(s):  [name] Patient will achieve adherence to monitoring guidelines and medication adherence to achieve therapeutic efficacy through collaboration with PharmD and provider.    Interventions: . 1:1 collaboration with [name], FNP regarding development and update of comprehensive plan of care as evidenced by provider attestation and co-signature . Inter-disciplinary care team collaboration (see longitudinal plan of care) . Comprehensive medication review performed; medication list updated in electronic medical record  Hypertension (BP goal <140/90) -Controlled -Current treatment: . Amlodipine 5 mg tablet once daily  -Current home readings: patient reports that he is not currently checking his BP  -Current dietary habits: patient reports that he believes he eats well, eggs, bacon and toast, he reports that he might eat too much salt.  -Current exercise  habits: patient reports he walks once or twice per week for about 10 minutes. He would like to walk more maybe about 10 minutes a day.  -Reports hypotensive/hypertensive symptoms -Educated on Daily salt intake goal < 2300 mg; -Counseled to monitor BP at home at least once per week, document, and provide log at future appointments -Counseled on diet and exercise extensively Recommended to continue current medication  Hyperlipidemia: (LDL goal <70) -Uncontrolled -Current treatment: . ASA 81 mg tablet daily  . Pravastatin 20 mg tablet take daily o Patient reports not being adherent   -Current dietary patterns: patient reports avoiding most fried and fatty foods.  -Current exercise habits: patient is currently working out for 10 minutes two days per week  -Educated on Cholesterol goals;  Benefits of statin for ASCVD risk reduction; Importance of limiting foods high in cholesterol; Strategies to manage statin-induced myalgias; -Recommended to continue current medication Counseled on the importance of taking his medication everyday.  Collaborated with PCP to change patients current cholesterol medication to increase adherence.  Will recommend that patients medication be changed to atorvastatin 10 mg tablet once per week to increase patients adherence to medication regimen.    Vitamin D Deficiency -Uncontrolled -Current treatment   Vitamin D take 1 capsule by mouth once per week  -Patient reports that he has not been taking his medication once per week.  -Recommended to continue current medication   Tobacco use (Goal Smoking Cessation) -Uncontrolled -Previous quit attempts: will discuss further during next visit.  -Current treatment  . None at this time -Will discuss further during next office visit. -Provided contact information for [place] Quit Line (1-800-QUIT-NOW) and encouraged patient to reach out to this group for support.   Health Maintenance -Vaccine gaps: Patient is a  candidate for: COVID-19 Booster, Zostavax, Prevnar and Pneumovax   o Will discuss further with patient at next office visit in April  -Educated on the importance of being care with herbal remedies  -Will discuss the importance of vaccination when patient is seen in April.    Patient Goals/Self-Care Activities . Patient will:  - take medications as prescribed focus on medication adherence by using medication packaging.   Follow Up Plan:  Telephone follow up appointment with care management team member scheduled for:  09/17/2020      Medication Assistance: None required.  Patient affirms current coverage meets needs.  Patient's preferred pharmacy is:  [id number] [place], [address] [id number]: [phone]: [phone] Uses pill box? No - patient does not use a pill box.  Pt endorses 80% compliance  We discussed: Verbal consent obtained for [facility] enhanced pharmacy services (medication synchronization, adherence packaging, delivery coordination). A medication sync plan was created to allow patient to get all medications delivered once every 30 to 90 days per patient preference. Patient understands they have freedom to choose pharmacy and clinical pharmacist will coordinate care between all prescribers and [facility].  Patient decided to: Utilize [facility] for medication synchronization, packaging and delivery  Care Plan and Follow Up Patient Decision:  Patient agrees to Care Plan and Follow-up.  Plan: The patient has been provided with contact information for the care management team and has been advised to call with any health related questions or concerns.   VOrlando Penner PharmD Clinical Pharmacist [facility] [phone]

## 2020-08-14 ENCOUNTER — Ambulatory Visit: Payer: Medicare Other

## 2020-08-14 ENCOUNTER — Ambulatory Visit: Payer: Self-pay

## 2020-08-14 ENCOUNTER — Telehealth: Payer: Medicare Other

## 2020-08-14 DIAGNOSIS — E559 Vitamin D deficiency, unspecified: Secondary | ICD-10-CM

## 2020-08-14 DIAGNOSIS — E782 Mixed hyperlipidemia: Secondary | ICD-10-CM

## 2020-08-14 DIAGNOSIS — I10 Essential (primary) hypertension: Secondary | ICD-10-CM | POA: Diagnosis not present

## 2020-08-14 DIAGNOSIS — F0391 Unspecified dementia with behavioral disturbance: Secondary | ICD-10-CM

## 2020-08-14 NOTE — Chronic Care Management (AMB) (Signed)
Chronic Care Management    Social Work Note  08/14/2020 Name: [name] MRN: [id number] DOB: 1945-07-20  KUTLER VANVRANKEN is a 75 y.o. year old male who is a primary care patient of [name], [place]. The CCM team was consulted to assist the patient with chronic disease management and/or care coordination needs related to: Level of Care Concerns.   Collaboration with [name] by phone for case discussion in response to provider referral for social work chronic care management and care coordination services.   Consent to Services:  The patient was given information about Chronic Care Management services, agreed to services, and gave verbal consent prior to initiation of services.  Please see initial visit note for detailed documentation.   Patient agreed to services and consent obtained.   Assessment: Review of patient past medical history, allergies, medications, and health status, including review of relevant consultants reports was performed today as part of a comprehensive evaluation and provision of chronic care management and care coordination services.     SDOH (Social Determinants of Health) assessments and interventions performed:    Advanced Directives Status: Not addressed in this encounter.  CCM Care Plan  No Known Allergies  Outpatient Encounter Medications as of 08/14/2020  Medication Sig  . amLODipine (NORVASC) 5 MG tablet Take 1 tablet (5 mg total) by mouth daily.  [name] aspirin EC 81 MG tablet Take 81 mg by mouth daily.  [name] escitalopram (LEXAPRO) 5 MG tablet Take 1 tablet (5 mg total) by mouth daily.  . Memantine HCl-Donepezil HCl (NAMZARIC) 28-10 MG CP24 Take 1 capsule by mouth daily.  . pravastatin (PRAVACHOL) 20 MG tablet Take 1 tablet (20 mg total) by mouth daily.  . Vitamin D, Ergocalciferol, (DRISDOL) 1.25 MG (50000 UNIT) CAPS capsule Take 1 capsule (50,000 Units total) by mouth every 7 (seven) days.   No facility-administered encounter medications on  file as of 08/14/2020.    There are no problems to display for this patient.   Conditions to be addressed/monitored: HTN and Dementia; Level of care concerns  Care Plan : Social Work [facility] Care Plan  Updates made by [name] since 08/14/2020 12:00 AM    Problem: Home and Family Safety (Wellness)     Goal: Home and Family Safety Maintained   Start Date: 07/22/2020  Expected End Date: 09/05/2020  This Visit's Progress: On track  Recent Progress: On track  Priority: High  Note:   Current Barriers:  . Chronic disease management support and education needs related to HTN and Dementia  . Limited social support . Level of care concerns . Family and relationship dysfunction . Social Isolation . Memory Deficits  Social Worker Clinical Goal(s):   [name] Over the next 45 days, patient will work with SW to address concerns related to home and family safety  CCM SW Interventions:  . Inter-disciplinary care team collaboration (see longitudinal plan of care) . Collaboration with [name], FNP regarding development and update of comprehensive plan of care as evidenced by provider attestation and co-signature . Inbound call received from [name], SW with APS to discuss patient care needs . Discussed during her home visit [name] did not have any concerns with the condition of the home or the care for the patient . Determined [name] did discuss concerns with Mrs. Valera regarding placement if she is unable to care for the patient especially focused on medication management and health care needs . Advised [name] since her home visit the patient  has been seen by his primary care provider and restarted on medications . Discussed Mrs. Stegman has plans to get the patient linked with the local [facility] office in [place] for medical care needs . [name] requests SW assist with a PCS application to get care in the home . Advised [name] that this SW has  discussed caregiver resources with Mrs. Whang several times. The patient is well over the income limit for [facility] services and Mrs. Hausmann does not want to privately pay for a caregiver in the home . Determined [name] plans to make one last home visit prior to closing patient case - she will plan to provide information for [facility] and encourage Mrs. Wrightsman to obtain a few hours of services each week so that she may have a break as a caregiver . Advised [name] that if the patient does enroll with the [facility] as his primary provider this SW will end services as he will no longer be eligible . Collaboration with RN Care Manager regarding outcome of today's call  Patient Goals/Self-Care Activities . Over the next 20 days, patient will: With the help of his spouse  - Patient will attend all scheduled provider appointments Engage with [facility] APS as warranted  Engage with [facility] in [place] to establish care  Follow Up Plan: The care management team will reach out to the patient again over the next 21 days.         Follow Up Plan: SW will follow up with patient by phone over the next 21 days.

## 2020-08-18 ENCOUNTER — Other Ambulatory Visit: Payer: Self-pay | Admitting: Nurse Practitioner

## 2020-08-18 DIAGNOSIS — E782 Mixed hyperlipidemia: Secondary | ICD-10-CM

## 2020-08-18 MED ORDER — ATORVASTATIN CALCIUM 10 MG PO TABS: ORAL_TABLET | ORAL | 2 refills | Status: DC

## 2020-08-18 NOTE — Patient Instructions (Signed)
Visit Information It was great speaking with you today!  Please let me know if you have any questions about our visit.  Goals Addressed            This Visit's Progress   . Lifestyle Change-Hypertension       Timeframe:  Long-Range Goal Priority:  High Start Date:                             Expected End Date:                       Follow Up Date 09/17/2020   - agree to work together to make changes - ask questions to understand - learn about high blood pressure    Why is this important?    The changes that you are asked to make may be hard to do.   This is especially true when the changes are life-long.   Knowing why it is important to you is the first step.   Working on the change with your family or support person helps you not feel alone.   Reward yourself and family or support person when goals are met. This can be an activity you choose like bowling, hiking, biking, swimming or shooting hoops.          Patient Care Plan: Social Work [facility] Care Plan    Problem Identified: Disease Progression - Dementia     Long-Range Goal: Disease Progression Managed with Education and Resources   Start Date: 07/15/2020  Expected End Date: 11/12/2020  Priority: High  Note:   Current Barriers:  . Level of care concerns . Memory Deficits . Chronic conditions including HTN and Dementia  Social Work Clinical Goal(s):  [name] Over the next 120 days the patient and his spouse will work with SW to identify resources to assist with disease progression  Interventions: . 1:1 collaboration with [name], [place] regarding development and update of comprehensive plan of care as evidenced by provider attestation and co-signature . Inter-disciplinary care team collaboration (see longitudinal plan of care) . Successful outbound call placed to the patients spouse, [name] in response to a voice message received . Discussed [name] would like to speak with embedded PharmD regarding  medication costs . Performed chart review to note unsuccessful call placed to the patient on [phone] by [name] to schedule an initial appointment with Pharmacist . Encouraged [name] to return call to [facility] in order to meet with pharmacist regarding pharmacy related questions . [name] stated she will plan to pick up newly filled prescriptions today and speak with [name] at a later time regarding medication costs . Assessed for outcome of call to [facility] office - [name] reports she has yet to call this resource as she is having a very emotional day and can't get her thoughts together . Encouraged [name] to focus on what she can accomplish today which is picking up medications and returning [name]'s call regarding pharmacy scheduling . Collaboration with RN Care Manager regarding interventions and plan  Patient Goals/Self-Care Activities Over the next 30 days, patient will: With the help of his spouse  - Patient will call provider office for new concerns or questions Contact SW as needed prior to next scheduled call Pick up prescriptions from pharmacy Return call to [name], scheduling care guide to obtain an appointment with embedded pharmacist  Follow up Plan: SW  will follow up with patient by phone over the next 7 days    Problem Identified: Home and Family Safety (Wellness)     Goal: Home and Family Safety Maintained   Start Date: 07/22/2020  Expected End Date: 09/05/2020  This Visit's Progress: On track  Recent Progress: On track  Priority: High  Note:   Current Barriers:  . Chronic disease management support and education needs related to HTN and Dementia  . Limited social support . Level of care concerns . Family and relationship dysfunction . Social Isolation . Memory Deficits  Social Worker Clinical Goal(s):   [name] Over the next 45 days, patient will work with SW to address concerns related to home and family safety  CCM SW Interventions:   . Inter-disciplinary care team collaboration (see longitudinal plan of care) . Collaboration with Eugene Brine, FNP regarding development and update of comprehensive plan of care as evidenced by provider attestation and co-signature . Inbound call received from [name], SW with APS to discuss patient care needs . Discussed during her home visit [name] did not have any concerns with the condition of the home or the care for the patient . Determined [name] did discuss concerns with [name] regarding placement if she is unable to care for the patient especially focused on medication management and health care needs . Advised [name] since her home visit the patient has been seen by his primary care provider and restarted on medications . Discussed [name] has plans to get the patient linked with the local [facility] office in [place] for medical care needs . [name] requests SW assist with a PCS application to get care in the home . Advised [name] that this SW has discussed caregiver resources with [name] several times. The patient is well over the income limit for [facility] services and [name] does not want to privately pay for a caregiver in the home . Determined [name] plans to make one last home visit prior to closing patient case - she will plan to provide information for [facility] and encourage [name] to obtain a few hours of services each week so that she may have a break as a caregiver . Advised [name] that if the patient does enroll with the [facility] as his primary provider this SW will end services as he will no longer be eligible . Collaboration with RN Care Manager regarding outcome of today's call  Patient Goals/Self-Care Activities . Over the next 20 days, patient will: With the help of his spouse  - Patient will attend all scheduled provider appointments Engage with [facility] APS as warranted   Engage with [place] in [place] to establish care  Follow Up Plan: The care management team will reach out to the patient again over the next 21 days.      Patient Care Plan: Medication Adherence    Problem Identified: Medication Adherence (Wellness)   Priority: High    Goal: Medication Adherence Maintained   Start Date: 08/03/2020  Expected End Date: 10/05/2020  This Visit's Progress: On track  Recent Progress: Not on track  Priority: High  Note:   Current Barriers:   Ineffective Self Health Maintenance  Does not adhere to prescribed medication regimen  Lacks social connections  Does not maintain contact with provider office Clinical Goal(s):  [name] Collaboration with Eugene Brine, FNP regarding development and update of comprehensive plan of care as evidenced by provider attestation and co-signature .  Inter-disciplinary care team collaboration (see longitudinal plan of care)  patient will work with care management team to address care coordination and chronic disease management needs related to Disease Management  Educational Needs  Care Coordination  Medication Management and Education  Medication Reconciliation  Psychosocial Support  Dementia and Caregiver Support  Level of Care Concerns   Interventions:  08/11/20 completed inbound call with wife Giulian Goldring   Evaluation of current treatment plan related to Non-adherence to medication regimen self-management and patient's adherence to plan as established by provider.  Collaboration with Eugene Brine, FNP regarding development and update of comprehensive plan of care as evidenced by provider attestation       and co-signature  Inter-disciplinary care team collaboration (see longitudinal plan of care)  Determined Mr. Ingalls has all prescribed medications on hand at this time, although Mrs. Harrel continues to report financial hardship paying for a few of the medications  Determined Mrs. Broughton is now administering  Mr. Leavell's medications and ensuring that he is getting them down and taking them on the prescribed schedule  Discussed with Mrs. [name], her upcoming telephone appointment with the embedded Pharm D to discuss pharmacy needs and explore assistance for medication cost  . Discussed plans with patient for ongoing care management follow up and provided patient with direct contact information for care management team Patient [place] Activities:  . - call for medicine refill 7 days before it runs out . - call if I am sick and can't take my medicine . - keep a list of all the medicines I take; vitamins and herbals too . - learn to read medicine labels . - use a pillbox to sort medicine . - use an alarm clock or phone to remind me to take my medicine . - Collaborate with the embedded Pharm D regarding assistance needed for med cost of Namzaric   Follow Up Plan: Telephone follow up appointment with care management team member scheduled for: 08/14/20   Patient Care Plan: General Plan of Care (Adult)    Problem Identified: Therapeutic Alliance (General Plan of Care)   Priority: High    Goal: Therapeutic Alliance Established   Start Date: 08/03/2020  Expected End Date: 10/05/2020  This Visit's Progress: On track  Recent Progress: On track  Priority: High  Note:   Current Barriers:   Ineffective Self Health Maintenance  Unable to self administer medications as prescribed  Does not adhere to prescribed medication regimen  Lacks social connections  Does not maintain contact with provider office Clinical Goal(s):  [name] Collaboration with Eugene Brine, FNP regarding development and update of comprehensive plan of care as evidenced by provider attestation and co-signature . Inter-disciplinary care team collaboration (see longitudinal plan of care)  patient will work with care management team to address care coordination and chronic disease management needs related to Disease  Management  Educational Needs  Care Coordination  Medication Management and Education  Medication Reconciliation  Psychosocial Support  Dementia and Caregiver Support  Level of Care Concerns   Interventions:  08/03/20 completed successful call with wife Jenna Ardoin  Evaluation of current treatment plan related to Effective Health Care Maintenance self-management and patient's adherence to plan as established by provider.  Collaboration with Eugene Brine, FNP regarding development and update of comprehensive plan of care as evidenced by provider attestation       and co-signature  Inter-disciplinary care team collaboration (see longitudinal plan of care)  Encouraged collaboration with the treatment team.   Provided  emotional support; encourage patient to share feelings of anger, fear and anxiety.   Educated on importance of ensuring patient is receiving effective health care maintenance by making and keeping all MD follow up appointments as recommended and directed  Scheduled patient for a PCP follow up appointment with Eugene Brine FNP for tomorrow am, 08/04/20 _0 :20 AM  Determined Mrs. Farver will provide transportation for this appointment and will accompany Mr. Rossman to this appointment  Determined Mrs. Helbing continues to need help in the home but cannot private pay  Placed joint call with Mrs. Schuh and the [employer], spoke with Rockney Ghee who advised Mrs. Copeman she may want to apply for Pension benefits and this will require for her to provide several requested documents  Discussed Mrs. Martis will call Ms. Patterson back at her convenience and when able to record the types of documents needed, Mrs. Maisano was provided the contact number per Ms. Sharlett Iles to call when ready [place] main office   Discussed plans with patient for ongoing care management follow up and provided patient with direct contact information for care management  team 08/04/20 successful call completed with wife Anias Bartol . Determined Mr. Reetz completed his PCP OV with Eugene Brine FNP . Determined Mrs. Fetch plans to contact the [employer] office as directed to request assistance with applying for the appropriate VA benefits . Provided Mrs. Thain the contact number for Rockney Ghee with the [place] office [phone] as previously discussed and encouraged her to set some time aside to complete this task ASAP  . Provided Mrs. Coppens with the contact number for the [facility] in [place] [phone] and advised Mr. Lattanzio can receive both dental and vision care through this clinic . Discussed plans with patient for ongoing care management follow up and provided patient with direct contact information for care management team Patient [place] Activities:  . - Contact the [place] office as directed for assistance with applying for appropriate VA benefits . - Contact the [facility] in [place], [place] to scheduled dental and vision appointments  . - Keep all scheduled MD follow up appointments . - Call the CCM team and or PCP for questions or concerns and or care coordination needs         Follow Up Plan: Telephone follow up appointment with care management team member scheduled for: 08/14/20    Patient Care Plan: CCM Pharmacy Care Plan    Problem Identified: HTN, HLD, Vitamin D Deficiency, Tobacc Use   Priority: High    Long-Range Goal: Disease Management   Start Date: 08/13/2020  This Visit's Progress: On track  Priority: High  Note:     Current Barriers:  . Unable to self administer medications as prescribed . Does not adhere to prescribed medication regimen  Pharmacist Clinical Goal(s):  [name] Patient will achieve adherence to monitoring guidelines and medication adherence to achieve therapeutic efficacy through collaboration with PharmD and provider.    Interventions: . 1:1 collaboration with Eugene Brine, FNP regarding development and update of comprehensive plan of care as evidenced by provider attestation and co-signature . Inter-disciplinary care team collaboration (see longitudinal plan of care) . Comprehensive medication review performed; medication list updated in electronic medical record  Hypertension (BP goal <140/90) -Controlled -Current treatment: . Amlodipine 5 mg tablet once daily  -Current home readings: patient reports that he is not currently checking his BP  -Current dietary habits: patient reports that he  believes he eats well, eggs, bacon and toast, he reports that he might eat too much salt.  -Current exercise habits: patient reports he walks once or twice per week for about 10 minutes. He would like to walk more maybe about 10 minutes a day.  -Reports hypotensive/hypertensive symptoms -Educated on Daily salt intake goal < 2300 mg; -Counseled to monitor BP at home at least once per week, document, and provide log at future appointments -Counseled on diet and exercise extensively Recommended to continue current medication  Hyperlipidemia: (LDL goal <70) -Uncontrolled -Current treatment: . ASA 81 mg tablet daily  . Pravastatin 20 mg tablet take daily o Patient reports not being adherent   -Current dietary patterns: patient reports avoiding most fried and fatty foods.  -Current exercise habits: patient is currently working out for 10 minutes two days per week  -Educated on Cholesterol goals;  Benefits of statin for ASCVD risk reduction; Importance of limiting foods high in cholesterol; Strategies to manage statin-induced myalgias; -Recommended to continue current medication Counseled on the importance of taking his medication everyday.  Collaborated with PCP to change patients current cholesterol medication to increase adherence.  Will recommend that patients medication be changed to atorvastatin 10 mg tablet once per week to increase patients adherence to  medication regimen.    Vitamin D Deficiency -Uncontrolled -Current treatment   Vitamin D take 1 capsule by mouth once per week  -Patient reports that he has not been taking his medication once per week.  -Recommended to continue current medication   Tobacco use (Goal Smoking Cessation) -Uncontrolled -Previous quit attempts: will discuss further during next visit.  -Current treatment  . None at this time -Will discuss further during next office visit. -Provided contact information for  Quit Line (1-800-QUIT-NOW) and encouraged patient to reach out to this group for support.   Health Maintenance -Vaccine gaps: Patient is a candidate for: COVID-19 Booster, Zostavax, Prevnar and Pneumovax   o Will discuss further with patient at next office visit in April  -Educated on the importance of being care with herbal remedies  -Will discuss the importance of vaccination when patient is seen in April.    Patient Goals/Self-Care Activities . Patient will:  - take medications as prescribed focus on medication adherence by using medication packaging.   Follow Up Plan: Telephone follow up appointment with care management team member scheduled for:  09/17/2020      [name] was given information about Chronic Care Management services today including:  1. CCM service includes personalized support from designated clinical staff supervised by his physician, including individualized plan of care and coordination with other care providers 2. 24/7 contact phone numbers for assistance for urgent and routine care needs. 3. Standard insurance, coinsurance, copays and deductibles apply for chronic care management only during months in which we provide at least 20 minutes of these services. Most insurances cover these services at 100%, however patients may be responsible for any copay, coinsurance and/or deductible if applicable. This service may help you avoid the need for more expensive face-to-face  services. 4. Only one practitioner may furnish and bill the service in a calendar month. 5. The patient may stop CCM services at any time (effective at the end of the month) by phone call to the office staff.  Patient agreed to services and verbal consent obtained.   The patient verbalized understanding of instructions, educational materials, and care plan provided today and agreed to receive a mailed copy of patient instructions, educational materials,  and care plan.   [name], PharmD Clinical Pharmacist [facility] [phone]

## 2020-08-19 NOTE — Chronic Care Management (AMB) (Signed)
Chronic Care Management   CCM RN Visit Note  08/14/2020 Name: [name] MRN: [id number] DOB: 12-29-1945  Subjective: [name] is a 75 y.o. year old male who is a primary care patient of [name], [place]. The care management team was consulted for assistance with disease management and care coordination needs.    Engaged with patient by telephone for follow up visit in response to provider referral for case management and/or care coordination services.   Consent to Services:  The patient was given information about Chronic Care Management services, agreed to services, and gave verbal consent prior to initiation of services.  Please see initial visit note for detailed documentation.   Patient agreed to services and verbal consent obtained.   Assessment: Review of patient past medical history, allergies, medications, health status, including review of consultants reports, laboratory and other test data, was performed as part of comprehensive evaluation and provision of chronic care management services.   SDOH (Social Determinants of Health) assessments and interventions performed:    CCM Care Plan  No Known Allergies  Outpatient Encounter Medications as of 08/14/2020  Medication Sig  . amLODipine (NORVASC) 5 MG tablet Take 1 tablet (5 mg total) by mouth daily.  [name] aspirin EC 81 MG tablet Take 81 mg by mouth daily.  [name] escitalopram (LEXAPRO) 5 MG tablet Take 1 tablet (5 mg total) by mouth daily.  . Memantine HCl-Donepezil HCl (NAMZARIC) 28-10 MG CP24 Take 1 capsule by mouth daily.  . Vitamin D, Ergocalciferol, (DRISDOL) 1.25 MG (50000 UNIT) CAPS capsule Take 1 capsule (50,000 Units total) by mouth every 7 (seven) days.  . [DISCONTINUED] pravastatin (PRAVACHOL) 20 MG tablet Take 1 tablet (20 mg total) by mouth daily.   No facility-administered encounter medications on file as of 08/14/2020.    There are no problems to display for this patient.   Conditions to be  addressed/monitored:HTN, Dementia, Vitamin D deficiency   Care Plan : Medication Adherence  Updates made by Lynne Logan, RN since 08/19/2020 12:00 AM  Completed 08/19/2020  Problem: Medication Adherence (Wellness) Resolved 07/17/2020  Priority: High    Goal: Medication Adherence Maintained Completed 07/17/2020  Start Date: 08/03/2020  Expected End Date: 10/05/2020  Recent Progress: On track  Priority: High  Note:   Current Barriers:   Ineffective Self Health Maintenance  Does not adhere to prescribed medication regimen  Lacks social connections  Does not maintain contact with provider office Clinical Goal(s):  [name] Collaboration with [name], FNP regarding development and update of comprehensive plan of care as evidenced by provider attestation and co-signature . Inter-disciplinary care team collaboration (see longitudinal plan of care)  patient will work with care management team to address care coordination and chronic disease management needs related to Disease Management  Educational Needs  Care Coordination  Medication Management and Education  Medication Reconciliation  Psychosocial Support  Dementia and Caregiver Support  Level of Care Concerns   Interventions:  08/14/20 completed inbound call with wife Viraj Liby   Evaluation of current treatment plan related to Non-adherence to medication regimen self-management and patient's adherence to plan as established by provider.  Collaboration with [name], FNP regarding development and update of comprehensive plan of care as evidenced by provider attestation       and co-signature  Inter-disciplinary care team collaboration (see longitudinal plan of care)  Determined Mr. Asare has all prescribed medications on hand at this time, although Mrs. Buonocore continues to report financial hardship paying for a  few of the medications  Determined Mrs. Joubert is now administering Mr. Dominy's medications and ensuring  that he is getting them down and taking them on the prescribed schedule  Confirmed patient is working with the embedded Pharm D for assistance with medication management/adherence . Discussed plans with patient for ongoing care management follow up and provided patient with direct contact information for care management team Patient [place] Activities:  . - call for medicine refill 7 days before it runs out . - call if I am sick and can't take my medicine . - keep a list of all the medicines I take; vitamins and herbals too . - learn to read medicine labels . - use a pillbox to sort medicine . - use an alarm clock or phone to remind me to take my medicine . - Collaborate with the embedded Pharm D regarding assistance needed for med cost of Namzaric    Care Plan : General Plan of Care (Adult)  Updates made by Lynne Logan, RN since 08/19/2020 12:00 AM    Problem: Therapeutic Alliance (General Plan of Care)   Priority: High    Goal: Therapeutic Alliance Established   Start Date: 08/03/2020  Expected End Date: 10/05/2020  This Visit's Progress: On track  Recent Progress: On track  Priority: High  Note:   Current Barriers:   Ineffective Self Health Maintenance  Unable to self administer medications as prescribed  Does not adhere to prescribed medication regimen  Lacks social connections  Does not maintain contact with provider office Clinical Goal(s):  [name] Collaboration with [name], FNP regarding development and update of comprehensive plan of care as evidenced by provider attestation and co-signature . Inter-disciplinary care team collaboration (see longitudinal plan of care)  patient will work with care management team to address care coordination and chronic disease management needs related to Disease Management  Educational Needs  Care Coordination  Medication Management and Education  Medication Reconciliation  Psychosocial Support  Dementia and  Caregiver Support  Level of Care Concerns   Interventions:  08/14/20 completed successful call with wife Teron Blais  Evaluation of current treatment plan related to Effective Health Care Maintenance self-management and patient's adherence to plan as established by provider.  Collaboration with [name], FNP regarding development and update of comprehensive plan of care as evidenced by provider attestation       and co-signature  Inter-disciplinary care team collaboration (see longitudinal plan of care)  Determined Mrs. Blancett is awaiting a call from a [employer] representative who help her navigate the application process for pension benefits for Mr. Nicolini  Determined she is considering switching Mr. Pascoe's primary care to the [facility] in [place] and will keep this RN CM notified of if and when this change will take place  Discussed plans with patient for ongoing care management follow up and provided patient with direct contact information for care management team Patient [place] Activities:  . - Contact the [place] office as directed for assistance with applying for appropriate VA benefits . - Contact the [facility] in [place], [place] to scheduled dental and vision appointments  . - Keep all scheduled MD follow up appointments . - Call the CCM team and or PCP for questions or concerns and or care coordination needs         Follow Up Plan: Telephone follow up appointment with care management team member scheduled for: 09/25/20    Plan:Telephone follow up appointment with care management team  member scheduled for:  09/25/20  Barb Merino, RN, BSN, CCM Care Management Coordinator [place] [facility]  Direct Phone: [phone]

## 2020-08-20 ENCOUNTER — Ambulatory Visit: Payer: Medicare Other

## 2020-08-21 ENCOUNTER — Telehealth: Payer: Self-pay

## 2020-08-21 NOTE — Progress Notes (Signed)
08/21/20-Called and spoke with the patient's wife to ask if she could call [employer] and speak with a customer service representative to identify if [facility] is a preferred [place] under insurance plan. Provided patient's wife the address information for [facility] and telephone number. The patient's wife voiced understanding and stated she will contact the patient's insurance.   08/25/20-Called and spoke with [employer] who handles [employer] members  Prescription drug coverage plans to identify if [facility] is preferred, [facility] is not In-Network under the patients insurance plan.   Orlando Penner, CPP Notified.  Raynelle Highland, [place] Pharmacist Assistant [phone] CCM Total Time: 46 minutes (Call to [employer] 31 minutes)

## 2020-08-31 ENCOUNTER — Telehealth: Payer: Medicare Other

## 2020-08-31 ENCOUNTER — Telehealth: Payer: Self-pay

## 2020-08-31 NOTE — Telephone Encounter (Signed)
  Chronic Care Management   Outreach Note  08/31/2020 Name: [name] MRN: [id number] DOB: Aug 30, 1945  Referred by: [name], FNP Reason for referral : Chronic Care Management (Unsuccessful outbound call)   An unsuccessful telephone outreach was attempted today. The patient was referred to the case management team for assistance with care management and care coordination.   Follow Up Plan: A HIPAA compliant phone message was left for the patient providing contact information and requesting a return call.  The care management team will reach out to the patient again over the next 21 days.   [name], BSW, CDP Social Worker, Certified Dementia Practitioner [place] [facility] [place] [facility] [phone]

## 2020-09-04 ENCOUNTER — Ambulatory Visit
Admission: RE | Admit: 2020-09-04 | Discharge: 2020-09-04 | Disposition: A | Discharge status: Home / Self Care | Payer: Medicare Other | Source: Ambulatory Visit | Attending: Nurse Practitioner | Admitting: Nurse Practitioner

## 2020-09-04 DIAGNOSIS — I2510 Atherosclerotic heart disease of native coronary artery without angina pectoris: Secondary | ICD-10-CM | POA: Diagnosis not present

## 2020-09-04 DIAGNOSIS — Z122 Encounter for screening for malignant neoplasm of respiratory organs: Secondary | ICD-10-CM

## 2020-09-04 DIAGNOSIS — J432 Centrilobular emphysema: Secondary | ICD-10-CM | POA: Diagnosis not present

## 2020-09-04 DIAGNOSIS — K7689 Other specified diseases of liver: Secondary | ICD-10-CM | POA: Diagnosis not present

## 2020-09-04 DIAGNOSIS — F17210 Nicotine dependence, cigarettes, uncomplicated: Secondary | ICD-10-CM | POA: Diagnosis not present

## 2020-09-04 DIAGNOSIS — Z720 Tobacco use: Secondary | ICD-10-CM

## 2020-09-04 DIAGNOSIS — R059 Cough, unspecified: Secondary | ICD-10-CM

## 2020-09-16 ENCOUNTER — Telehealth: Payer: Self-pay

## 2020-09-16 NOTE — Chronic Care Management (AMB) (Signed)
Called and spoke with the patient reminding her of upcoming CCM Call appointment on 09/17/20 at 10:00AM with Orlando Penner, CPP. The patient was made aware to have medications and supplements nearby during phone call with CPP, Orlando Penner. Patient verbalized understanding.  Lizbeth Bark Clinical Pharmacist Assistant [phone]

## 2020-09-17 ENCOUNTER — Telehealth: Payer: Self-pay

## 2020-09-21 ENCOUNTER — Ambulatory Visit (INDEPENDENT_AMBULATORY_CARE_PROVIDER_SITE_OTHER): Payer: Medicare Other

## 2020-09-21 DIAGNOSIS — I10 Essential (primary) hypertension: Secondary | ICD-10-CM

## 2020-09-21 DIAGNOSIS — F0391 Unspecified dementia with behavioral disturbance: Secondary | ICD-10-CM | POA: Diagnosis not present

## 2020-09-21 NOTE — Patient Instructions (Signed)
Social Worker Visit Information  Goals we discussed today:  Goals Addressed            This Visit's Progress   . Home and Family Safety Maintained       Timeframe:  Short-Term Goal Priority:  High Start Date:  2.23.22                           Expected End Date: 4.9.22      Next planned outreach planned for: 5.23.22                   Patient Goals/Self-Care Activities . Over the next 20 days, patient will:   - Patient will attend all scheduled provider appointments Engage with [place] as warranted  Engage with [facility] in [place] to establish care         Follow Up Plan: SW will follow up with patient by phone over the next month   [name], BSW, CDP Social Worker, Certified Dementia Practitioner [place] [facility] [place] [facility] [phone]

## 2020-09-21 NOTE — Chronic Care Management (AMB) (Signed)
Chronic Care Management    Social Work Note  09/21/2020 Name: [name] MRN: [id number] DOB: 1946/05/20  [name] is a 75 y.o. year old male who is a primary care patient of [name], [place]. The CCM team was consulted to assist the patient with chronic disease management and/or care coordination needs related to: Level of Care Concerns.   Engaged with patient spouse by phone for follow up visit in response to provider referral for social work chronic care management and care coordination services.   Consent to Services:  The patient was given information about Chronic Care Management services, agreed to services, and gave verbal consent prior to initiation of services.  Please see initial visit note for detailed documentation.   Patient agreed to services and consent obtained.   Assessment: Review of patient past medical history, allergies, medications, and health status, including review of relevant consultants reports was performed today as part of a comprehensive evaluation and provision of chronic care management and care coordination services.     SDOH (Social Determinants of Health) assessments and interventions performed:    Advanced Directives Status: Not addressed in this encounter.  CCM Care Plan  No Known Allergies  Outpatient Encounter Medications as of 09/21/2020  Medication Sig  . amLODipine (NORVASC) 5 MG tablet Take 1 tablet (5 mg total) by mouth daily.  [name] aspirin EC 81 MG tablet Take 81 mg by mouth daily.  [name] atorvastatin (LIPITOR) 10 MG tablet Take one tablet by mouth M - F  . escitalopram (LEXAPRO) 5 MG tablet Take 1 tablet (5 mg total) by mouth daily.  . Memantine HCl-Donepezil HCl (NAMZARIC) 28-10 MG CP24 Take 1 capsule by mouth daily.  . Vitamin D, Ergocalciferol, (DRISDOL) 1.25 MG (50000 UNIT) CAPS capsule Take 1 capsule (50,000 Units total) by mouth every 7 (seven) days.   No facility-administered encounter medications on file as of 09/21/2020.     There are no problems to display for this patient.   Conditions to be addressed/monitored: HTN and Dementia; Level of care concerns  Care Plan : Social Work [facility] Care Plan  Updates made by [name] since 09/21/2020 12:00 AM    Problem: Home and Family Safety (Wellness)     Goal: Home and Family Safety Maintained   Start Date: 07/22/2020  Expected End Date: 09/05/2020  Recent Progress: On track  Priority: High  Note:   Current Barriers:  . Chronic disease management support and education needs related to HTN and Dementia  . Limited social support . Level of care concerns . Family and relationship dysfunction . Social Isolation . Memory Deficits  Social Worker Clinical Goal(s):   [name] Over the next 45 days, patient will work with SW to address concerns related to home and family safety  CCM SW Interventions:  . Inter-disciplinary care team collaboration (see longitudinal plan of care) . Collaboration with Eugene Brine, FNP regarding development and update of comprehensive plan of care as evidenced by provider attestation and co-signature . Successful outbound call placed to the patients wife, [name] to assess goal progression . Determined Mrs. Gram is unable to locate a form needed to enroll in care at the local [place] - her son plans to assist over the next month with locating this form . Discussed the patient continues to refuse bathing and medications but [name] is attempting to assist as the patient will allow . Mrs. Aloi is unable to speak further due to being in the grocery store .  Scheduled follow up call over the next month  Patient Goals/Self-Care Activities . Over the next 20 days, patient will: With the help of his spouse  - Patient will attend all scheduled provider appointments Engage with [facility] as warranted  Engage with [facility] in [place] to establish care  Follow Up Plan: The care management team will reach out to the patient again  over the next 30 days.         Follow Up Plan: SW will follow up with patient by phone over the next month      [name], BSW, CDP Social Worker, Certified Dementia Practitioner [place] / [place] [facility] [phone]  Total time spent performing care coordination and/or care management activities with the patient by phone or face to face = 20 minutes.

## 2020-09-24 ENCOUNTER — Telehealth: Payer: Self-pay

## 2020-09-24 ENCOUNTER — Other Ambulatory Visit: Payer: Self-pay

## 2020-09-24 DIAGNOSIS — I10 Essential (primary) hypertension: Secondary | ICD-10-CM

## 2020-09-24 DIAGNOSIS — E782 Mixed hyperlipidemia: Secondary | ICD-10-CM

## 2020-09-24 DIAGNOSIS — F3289 Other specified depressive episodes: Secondary | ICD-10-CM

## 2020-09-24 DIAGNOSIS — E559 Vitamin D deficiency, unspecified: Secondary | ICD-10-CM

## 2020-09-24 DIAGNOSIS — F0391 Unspecified dementia with behavioral disturbance: Secondary | ICD-10-CM

## 2020-09-24 MED ORDER — ESCITALOPRAM OXALATE 5 MG PO TABS: 5.0000 mg | ORAL_TABLET | Freq: Every day | ORAL | 1 refills | Status: DC

## 2020-09-24 MED ORDER — ATORVASTATIN CALCIUM 10 MG PO TABS: ORAL_TABLET | ORAL | 2 refills | Status: AC

## 2020-09-24 MED ORDER — VITAMIN D (ERGOCALCIFEROL) 1.25 MG (50000 UNIT) PO CAPS: 50000.0000 [IU] | ORAL_CAPSULE | ORAL | 1 refills | Status: DC

## 2020-09-24 MED ORDER — NAMZARIC 28-10 MG PO CP24: 1.0000 | ORAL_CAPSULE | Freq: Every day | ORAL | 1 refills | Status: DC

## 2020-09-24 MED ORDER — AMLODIPINE BESYLATE 5 MG PO TABS: 5.0000 mg | ORAL_TABLET | Freq: Every day | ORAL | 1 refills | Status: DC

## 2020-09-24 NOTE — Progress Notes (Signed)
Opened in error

## 2020-09-24 NOTE — Chronic Care Management (AMB) (Addendum)
    Chronic Care Management Pharmacy Assistant   Name: [name]  MRN: [id number] DOB: 1945/11/11  Reason for Encounter: Coordination of Enhanced Pharmacy Services  09/24/2020- Called patient's wife Suvan Stcyr to discuss patient quanitity of medication left on hand. Per wife she would like patient to being with [facility] no matter on cost. She informed me that she has a difficult time going out now due to having cancer and patient having Dementia. She just filled up his pill box and he has 12 days left of medications. Wife aware that we are requesting refills to be sent from PCP to [facility] and that [facility] will call her the day of delivery to discuss which medications are coming out and any copay cost and expected time of delivery on 10/02/2020. Wife is very appreciative for the help and new start with [facility].  Orlando Penner, CPP notified.    Medications: Outpatient Encounter Medications as of 09/24/2020  Medication Sig  . amLODipine (NORVASC) 5 MG tablet Take 1 tablet (5 mg total) by mouth daily.  [name] aspirin EC 81 MG tablet Take 81 mg by mouth daily.  [name] atorvastatin (LIPITOR) 10 MG tablet Take one tablet by mouth M - F  . escitalopram (LEXAPRO) 5 MG tablet Take 1 tablet (5 mg total) by mouth daily.  . Memantine HCl-Donepezil HCl (NAMZARIC) 28-10 MG CP24 Take 1 capsule by mouth daily.  . Vitamin D, Ergocalciferol, (DRISDOL) 1.25 MG (50000 UNIT) CAPS capsule Take 1 capsule (50,000 Units total) by mouth every 7 (seven) days.   No facility-administered encounter medications on file as of 09/24/2020.    Star Rating Drugs: Atorvastatin 10 mg- Last filled 08/18/2020 for 60 day supply at [facility].  SIG: Pattricia Boss, [place] [phone]

## 2020-09-25 ENCOUNTER — Telehealth: Payer: Medicare Other

## 2020-09-25 ENCOUNTER — Telehealth: Payer: Self-pay

## 2020-09-25 NOTE — Telephone Encounter (Signed)
  Chronic Care Management   Outreach Note  09/25/2020 Name: [name] MRN: [id number] DOB: 05-28-1946  Referred by: [name], FNP Reason for referral : Chronic Care Management (RN CM FU Call Attempt)   An unsuccessful telephone outreach was attempted today. The patient was referred to the case management team for assistance with care management and care coordination.   Follow Up Plan: A HIPAA compliant phone message was left for the patient providing contact information and requesting a return call. Telephone follow up appointment with care management team member scheduled for: 11/06/20  Barb Merino, RN, BSN, CCM Care Management Coordinator [facility] [place]  Direct Phone: [phone]

## 2020-09-29 ENCOUNTER — Telehealth: Payer: Self-pay

## 2020-09-29 NOTE — Chronic Care Management (AMB) (Signed)
09/29/2020- Received message from [place], patient medication Namzaric is not being covered by insurance, cash price by itself is $1,536.40, the total amount for all other medications is $52.81. Called wife to inform, no answer, left message to return call.  10/01/2020- Called wife again to update on medication price, no answer, left message to return call. Patient's wife called back, informed of price of Namzaric, she would like to put that back and he will receive from the [facility] hospital. Patient's wife is ok with other price for medications.  Orlando Penner, CPP and [facility] notified.  Pattricia Boss, [place] Pharmacist Assistant [phone]

## 2020-10-01 ENCOUNTER — Other Ambulatory Visit: Payer: Self-pay | Admitting: Nurse Practitioner

## 2020-10-01 DIAGNOSIS — F0391 Unspecified dementia with behavioral disturbance: Secondary | ICD-10-CM

## 2020-10-01 MED ORDER — NAMZARIC 28-10 MG PO CP24: 1.0000 | ORAL_CAPSULE | Freq: Every day | ORAL | 1 refills | Status: AC

## 2020-10-19 ENCOUNTER — Ambulatory Visit (INDEPENDENT_AMBULATORY_CARE_PROVIDER_SITE_OTHER): Payer: Medicare Other

## 2020-10-19 ENCOUNTER — Encounter: Payer: Self-pay | Admitting: Nurse Practitioner

## 2020-10-19 DIAGNOSIS — F32A Depression, unspecified: Secondary | ICD-10-CM | POA: Insufficient documentation

## 2020-10-19 DIAGNOSIS — E559 Vitamin D deficiency, unspecified: Secondary | ICD-10-CM | POA: Insufficient documentation

## 2020-10-19 DIAGNOSIS — I10 Essential (primary) hypertension: Secondary | ICD-10-CM

## 2020-10-19 DIAGNOSIS — F0391 Unspecified dementia with behavioral disturbance: Secondary | ICD-10-CM

## 2020-10-19 DIAGNOSIS — F0390 Unspecified dementia without behavioral disturbance: Secondary | ICD-10-CM | POA: Insufficient documentation

## 2020-10-19 DIAGNOSIS — E782 Mixed hyperlipidemia: Secondary | ICD-10-CM

## 2020-10-19 HISTORY — DX: Essential (primary) hypertension: I10

## 2020-10-19 HISTORY — DX: Mixed hyperlipidemia: E78.2

## 2020-10-19 NOTE — Patient Instructions (Signed)
Social Worker Visit Information  Goals we discussed today:  Goals Addressed            This Visit's Progress   . Disease Progression Managed       Timeframe:  Long-Range Goal Priority:  High Start Date:     2.16.22                        Expected End Date: 6.16.22                      Next planned outreach date: 5.31.22  Patient Goals/Self-Care Activities Over the next 30 days, patient will: With the help of his spouse  - Work with SW to obtain long term care placement    . COMPLETED: Home and Family Safety Maintained       Timeframe:  Short-Term Goal Priority:  High Start Date:  2.23.22                           Expected End Date: 4.9.22      10/19/20- goal closed            Patient Goals/Self-Care Activities . Over the next 20 days, patient will:   - Patient will attend all scheduled provider appointments Engage with [place] as warranted          Materials Provided: Verbal education about long term placement provided by phone  Follow Up Plan: SW will follow up with patient by phone over the next week   [name], BSW, CDP Social Worker, Certified Dementia Practitioner [place] / [place] Management [phone]

## 2020-10-19 NOTE — Chronic Care Management (AMB) (Signed)
Chronic Care Management    Social Work Note  10/19/2020 Name: [name] MRN: [id number] DOB: Mar 04, 1946  [name] is a 75 y.o. year old male who is a primary care patient of [name], [place]. The CCM team was consulted to assist the patient with chronic disease management and/or care coordination needs related to: Level of Care Concerns.   Engaged with patient spouse/caregiver [name] by phone for follow up visit in response to provider referral for social work chronic care management and care coordination services.   Consent to Services:  The patient was given information about Chronic Care Management services, agreed to services, and gave verbal consent prior to initiation of services.  Please see initial visit note for detailed documentation.   Patient agreed to services and consent obtained.   Assessment: Review of patient past medical history, allergies, medications, and health status, including review of relevant consultants reports was performed today as part of a comprehensive evaluation and provision of chronic care management and care coordination services.     SDOH (Social Determinants of Health) assessments and interventions performed:    Advanced Directives Status: Not addressed in this encounter.  CCM Care Plan  No Known Allergies  Outpatient Encounter Medications as of 10/19/2020  Medication Sig  . amLODipine (NORVASC) 5 MG tablet Take 1 tablet (5 mg total) by mouth daily.  [name] aspirin EC 81 MG tablet Take 81 mg by mouth daily.  [name] atorvastatin (LIPITOR) 10 MG tablet Take one tablet by mouth M - F  . escitalopram (LEXAPRO) 5 MG tablet Take 1 tablet (5 mg total) by mouth daily.  . Memantine HCl-Donepezil HCl (NAMZARIC) 28-10 MG CP24 Take 1 capsule by mouth daily.  . Vitamin D, Ergocalciferol, (DRISDOL) 1.25 MG (50000 UNIT) CAPS capsule Take 1 capsule (50,000 Units total) by mouth every 7 (seven) days.   No facility-administered encounter medications on  file as of 10/19/2020.    Patient Active Problem List   Diagnosis Date Noted  . Dementia ([place]) 10/19/2020  . Essential hypertension 10/19/2020  . Mixed hyperlipidemia 10/19/2020  . Vitamin D deficiency 10/19/2020  . Depression 10/19/2020    Conditions to be addressed/monitored: HTN and Dementia; Level of care concerns  Care Plan : Social Work [facility] Care Plan  Updates made by [name] since 10/19/2020 12:00 AM    Problem: Disease Progression - Dementia     Long-Range Goal: Disease Progression Managed with Education and Resources   Start Date: 07/15/2020  Expected End Date: 11/12/2020  Priority: High  Note:   Current Barriers:  . Level of care concerns . Memory Deficits . Chronic conditions including HTN and Dementia  Social Work Clinical Goal(s):  [name] Over the next 120 days the patient and his spouse will work with SW to identify resources to assist with disease progression . Over the next 45 days the patient and his spouse will work with SW to identify placement options  Interventions: . 1:1 collaboration with [name], [place] regarding development and update of comprehensive plan of care as evidenced by provider attestation and co-signature . Inter-disciplinary care team collaboration (see longitudinal plan of care) . Successful outbound call placed to the patients spouse, [name] to assist with care coordination needs . Determined the patient is becoming unmanageable in the home - [name] reports the patient has not bathes in approximately 3-4 weeks, has had occasional BM incontinence without allowing her to clean him, and is not taking medications . [name] reports "I  can't take this anymore I need help" . Discussed [name] was unable to locate needed forms to enroll with the [place] for medical care- [name] denies ability to ask her family for assistance . Provided verbal education on placement process- discussed due to patient income he is able to privately  pay for memory care . Placed outbound calls to [place], [place] to determine all 3 had available beds . Successful outbound call placed to the patients spouse to determine [facility] would be a top choice based on room rate - [facility] has 1 male companion apartment available . Advised [name] SW would work with the patients primary provider to submit an Fl2 . Discussed placement process including [name]'s responsibility with completing paperwork needed for admission into the community . Collaboration with [name] FNP to request completion of Fl2  Patient Goals/Self-Care Activities Over the next 30 days, patient will: With the help of his spouse  -  Work with SW to obtain long term care placement  Follow up Plan: SW will follow up with patient by phone over the next 7 days  SW will follow up with [facility] to confirm receipt of faxed Fl2 within 24 hours after Fl2 is faxed     Problem: Home and Family Safety (Wellness) Resolved 10/19/2020    Goal: Home and Family Safety Maintained Completed 10/19/2020  Start Date: 07/22/2020  Expected End Date: 09/05/2020  Recent Progress: On track  Priority: High  Note:   Current Barriers:  . Chronic disease management support and education needs related to HTN and Dementia  . Limited social support . Level of care concerns . Family and relationship dysfunction . Social Isolation . Memory Deficits  Social Worker Clinical Goal(s):   [name] Over the next 45 days, patient will work with SW to address concerns related to home and family safety  CCM SW Interventions:  . Inter-disciplinary care team collaboration (see longitudinal plan of care) . Collaboration with [name], FNP regarding development and update of comprehensive plan of care as evidenced by provider attestation and co-signature . Successful outbound call placed to the patients wife, [name] to assess goal  progression . Determined [name] has yet to enroll the patient in the [place] for medical care due to inability to locate forms needed . Discussed [name] would like to place the patient due to inability to continue to provide care in the home (see alternative goal for updates and interventions) . Collaboration with Ike Bene to determine patients APS case has been closed  Patient Goals/Self-Care Activities . Over the next 20 days, patient will: With the help of his spouse  - Patient will attend all scheduled provider appointments Engage with [facility] APS as warranted            Follow Up Plan: SW will follow up with patient by phone over the next week      [name], BSW, CDP Social Worker, Certified Dementia Practitioner [place] [facility] [place] [facility] [phone]  Total time spent performing care coordination and/or care management activities with the patient by phone or face to face = 87 minutes.

## 2020-10-20 ENCOUNTER — Ambulatory Visit: Payer: Medicare Other

## 2020-10-20 DIAGNOSIS — F0391 Unspecified dementia with behavioral disturbance: Secondary | ICD-10-CM

## 2020-10-20 DIAGNOSIS — I10 Essential (primary) hypertension: Secondary | ICD-10-CM

## 2020-10-20 NOTE — Chronic Care Management (AMB) (Signed)
Chronic Care Management    Social Work Note  10/20/2020 Name: [name] MRN: [id number] DOB: 04-04-1946  [name] is a 75 y.o. year old male who is a primary care patient of [name], [place]. The CCM team was consulted to assist the patient with chronic disease management and/or care coordination needs related to: Level of Care Concerns.   Collaboration with Lyndel Pleasure with [facility] by phone for follow up on placement in response to provider referral for social work chronic care management and care coordination services.   Consent to Services:  The patient was given information about Chronic Care Management services, agreed to services, and gave verbal consent prior to initiation of services.  Please see initial visit note for detailed documentation.   Patient agreed to services and consent obtained.   Assessment: Review of patient past medical history, allergies, medications, and health status, including review of relevant consultants reports was performed today as part of a comprehensive evaluation and provision of chronic care management and care coordination services.     SDOH (Social Determinants of Health) assessments and interventions performed:    Advanced Directives Status: Not addressed in this encounter.  CCM Care Plan  No Known Allergies  Outpatient Encounter Medications as of 10/20/2020  Medication Sig  . amLODipine (NORVASC) 5 MG tablet Take 1 tablet (5 mg total) by mouth daily.  [name] aspirin EC 81 MG tablet Take 81 mg by mouth daily.  [name] atorvastatin (LIPITOR) 10 MG tablet Take one tablet by mouth M - F  . escitalopram (LEXAPRO) 5 MG tablet Take 1 tablet (5 mg total) by mouth daily.  . Memantine HCl-Donepezil HCl (NAMZARIC) 28-10 MG CP24 Take 1 capsule by mouth daily.  . Vitamin D, Ergocalciferol, (DRISDOL) 1.25 MG (50000 UNIT) CAPS capsule Take 1 capsule (50,000 Units total) by mouth every 7 (seven) days.   No facility-administered encounter medications on  file as of 10/20/2020.    Patient Active Problem List   Diagnosis Date Noted  . Dementia ([place]) 10/19/2020  . Essential hypertension 10/19/2020  . Mixed hyperlipidemia 10/19/2020  . Vitamin D deficiency 10/19/2020  . Depression 10/19/2020    Conditions to be addressed/monitored: HTN and Dementia; Level of care concerns  Care Plan : Social Work [facility] Care Plan  Updates made by [name] since 10/20/2020 12:00 AM    Problem: Disease Progression - Dementia     Long-Range Goal: Disease Progression Managed with Education and Resources   Start Date: 07/15/2020  Expected End Date: 11/12/2020  Priority: High  Note:   Current Barriers:  . Level of care concerns . Memory Deficits . Chronic conditions including HTN and Dementia  Social Work Clinical Goal(s):  [name] Over the next 120 days the patient and his spouse will work with SW to identify resources to assist with disease progression . Over the next 45 days the patient and his spouse will work with SW to identify placement options  Interventions: . 1:1 collaboration with [name], [place] regarding development and update of comprehensive plan of care as evidenced by provider attestation and co-signature . Inter-disciplinary care team collaboration (see longitudinal plan of care) . Successful outbound call placed to [facility] to confirm receipt of faxed [place] - spoke with [name] who requests office re-fax information . Collaboration with patients primary provider to request information be re-sent . SW will follow up with [facility] over the next day  Patient Goals/Self-Care Activities Over the next 30 days, patient will: With the help  of his spouse  -  Work with SW to obtain long term care placement  Follow up Plan: SW will follow up with patient by phone over the next 7 days  SW will follow up with [facility] to confirm receipt of faxed Fl2 within 24 hours after Fl2 is faxed        Follow Up Plan: SW will follow up  with patient by phone over the next week      [name], BSW, CDP Social Worker, Certified Dementia Practitioner [place] / [place] Management [phone]  Total time spent performing care coordination and/or care management activities with the patient by phone or face to face = 18 minutes.

## 2020-10-20 NOTE — Patient Instructions (Signed)
Social Worker Visit Information  Goals we discussed today:  Goals Addressed            This Visit's Progress   . Disease Progression Managed       Timeframe:  Long-Range Goal Priority:  High Start Date:     2.16.22                        Expected End Date: 6.16.22                      Next planned outreach date: 5.31.22.  Patient Goals/Self-Care Activities Over the next 30 days, patient will: With the help of his spouse  - Work with SW to obtain long term care placement        Follow Up Plan: SW will follow up with patient by phone over the next week   [name], BSW, CDP Social Worker, Certified Dementia Practitioner [place] [facility] [place] Management [phone]

## 2020-10-21 ENCOUNTER — Telehealth: Payer: Self-pay

## 2020-10-21 NOTE — Telephone Encounter (Signed)
  Care Management   Follow Up Note   10/21/2020 Name: [name] MRN: [id number] DOB: [birth year]   Referred by: [name], FNP Reason for referral : Chronic Care Management   SW placed a successful outbound call to [facility] with [facility] to confirm receipt of Fl2. [name] reports she is in a clinical meeting to review candidates for admission and will contact SW at a later time regarding ability to offer placement.  [name], BSW, CDP Social Worker, Certified Dementia Practitioner [place] [facility] [place] Management [phone]

## 2020-10-22 ENCOUNTER — Ambulatory Visit: Payer: Self-pay

## 2020-10-22 DIAGNOSIS — F0391 Unspecified dementia with behavioral disturbance: Secondary | ICD-10-CM

## 2020-10-22 DIAGNOSIS — I10 Essential (primary) hypertension: Secondary | ICD-10-CM

## 2020-10-22 NOTE — Patient Instructions (Signed)
Social Worker Visit Information  Goals we discussed today:  Goals Addressed            This Visit's Progress   . Disease Progression Managed       Timeframe:  Long-Range Goal Priority:  High Start Date:     2.16.22                        Expected End Date: 6.16.22                      Next planned outreach date: 5.31.22.  Patient Goals/Self-Care Activities Over the next 30 days, patient will: With the help of his spouse  - Work with son to locate [place] forms         Follow Up Plan: SW will follow up with patient by phone over the next week  [name], BSW, CDP Social Worker, Certified Dementia Practitioner [place] / [place] Management [phone]

## 2020-10-22 NOTE — Chronic Care Management (AMB) (Signed)
Chronic Care Management    Social Work Note  10/22/2020 Name: [name] MRN: [id number] DOB: [birth year]  [name] is a 75 y.o. year old male who is a primary care patient of [name], [name]. The CCM team was consulted to assist the patient with chronic disease management and/or care coordination needs related to: Level of Care Concerns.   Engaged with patient spouse by phone for follow up visit in response to provider referral for social work chronic care management and care coordination services.   Consent to Services:  The patient was given information about Chronic Care Management services, agreed to services, and gave verbal consent prior to initiation of services.  Please see initial visit note for detailed documentation.   Patient agreed to services and consent obtained.   Assessment: Review of patient past medical history, allergies, medications, and health status, including review of relevant consultants reports was performed today as part of a comprehensive evaluation and provision of chronic care management and care coordination services.     SDOH (Social Determinants of Health) assessments and interventions performed:    Advanced Directives Status: Not addressed in this encounter.  CCM Care Plan  No Known Allergies  Outpatient Encounter Medications as of 10/22/2020  Medication Sig  . amLODipine (NORVASC) 5 MG tablet Take 1 tablet (5 mg total) by mouth daily.  [name] aspirin EC 81 MG tablet Take 81 mg by mouth daily.  [name] atorvastatin (LIPITOR) 10 MG tablet Take one tablet by mouth M - F  . escitalopram (LEXAPRO) 5 MG tablet Take 1 tablet (5 mg total) by mouth daily.  . Memantine HCl-Donepezil HCl (NAMZARIC) 28-10 MG CP24 Take 1 capsule by mouth daily.  . Vitamin D, Ergocalciferol, (DRISDOL) 1.25 MG (50000 UNIT) CAPS capsule Take 1 capsule (50,000 Units total) by mouth every 7 (seven) days.   No facility-administered encounter medications on file as of 10/22/2020.     Patient Active Problem List   Diagnosis Date Noted  . Dementia ([place]) 10/19/2020  . Essential hypertension 10/19/2020  . Mixed hyperlipidemia 10/19/2020  . Vitamin D deficiency 10/19/2020  . Depression 10/19/2020    Conditions to be addressed/monitored: HTN and Dementia; Level of care concerns  Care Plan : Social Work [facility] Care Plan  Updates made by [name] since 10/22/2020 12:00 AM    Problem: Disease Progression - Dementia     Long-Range Goal: Disease Progression Managed with Education and Resources   Start Date: 07/15/2020  Expected End Date: 11/12/2020  Priority: High  Note:   Current Barriers:  . Level of care concerns . Memory Deficits . Chronic conditions including HTN and Dementia  Social Work Clinical Goal(s):  [name] Over the next 120 days the patient and his spouse will work with SW to identify resources to assist with disease progression . Over the next 45 days the patient and his spouse will work with SW to identify placement options  Interventions: . 1:1 collaboration with [name], [place] regarding development and update of comprehensive plan of care as evidenced by provider attestation and co-signature . Inter-disciplinary care team collaboration (see longitudinal plan of care) . Inbound call received from the patients spouse who reports her son is assisting with locating [facility] documents needed to get patient into the [place] . [name] indicates she would like to hold off on placement at this time  Patient Goals/Self-Care Activities Over the next 30 days, patient will: With the help of his spouse  -  Work with son  to locate VA paperwork  Follow up Plan: SW will follow up with patient by phone over the next 7 days         Follow Up Plan: SW will follow up with patient by phone over the next week.      [name], BSW, CDP Social Worker, Certified Dementia Practitioner [place] / [place] Management [phone]  Total time spent performing care  coordination and/or care management activities with the patient by phone or face to face = 10 minutes.

## 2020-10-27 ENCOUNTER — Telehealth: Payer: Self-pay

## 2020-10-27 ENCOUNTER — Telehealth: Payer: Medicare Other

## 2020-10-27 NOTE — Telephone Encounter (Signed)
  Care Management   Follow Up Note   10/27/2020 Name: [name] MRN: [id number] DOB: 29-Dec-1945   Referred by: [name], FNP Reason for referral : Chronic Care Management (Unsuccessful call)   An unsuccessful telephone outreach was attempted today. The patient was referred to the case management team for assistance with care management and care coordination.  SW left a HIPAA compliant voice message requesting a return call.  Follow Up Plan: The care management team will reach out to the patient again over the next 30 days.   [name], BSW, CDP Social Worker, Certified Dementia Practitioner [place] / [place] Management [phone]

## 2020-11-04 ENCOUNTER — Ambulatory Visit: Payer: Medicare Other | Admitting: Nurse Practitioner

## 2020-11-06 ENCOUNTER — Telehealth: Payer: Medicare Other

## 2020-11-06 ENCOUNTER — Ambulatory Visit (INDEPENDENT_AMBULATORY_CARE_PROVIDER_SITE_OTHER): Payer: Medicare Other

## 2020-11-06 DIAGNOSIS — F0391 Unspecified dementia with behavioral disturbance: Secondary | ICD-10-CM

## 2020-11-06 DIAGNOSIS — I10 Essential (primary) hypertension: Secondary | ICD-10-CM

## 2020-11-06 DIAGNOSIS — E559 Vitamin D deficiency, unspecified: Secondary | ICD-10-CM

## 2020-11-10 NOTE — Chronic Care Management (AMB) (Signed)
Chronic Care Management   CCM RN Visit Note  11/06/2020 Name: [name] MRN: [id number] DOB: 05/14/1946  Subjective: [name] is a 75 y.o. year old male who is a primary care patient of [name], [place]. The care management team was consulted for assistance with disease management and care coordination needs.    Engaged with patient by telephone for follow up visit in response to provider referral for case management and/or care coordination services.   Consent to Services:  The patient was given information about Chronic Care Management services, agreed to services, and gave verbal consent prior to initiation of services.  Please see initial visit note for detailed documentation.   Patient agreed to services and verbal consent obtained.   Assessment: Review of patient past medical history, allergies, medications, health status, including review of consultants reports, laboratory and other test data, was performed as part of comprehensive evaluation and provision of chronic care management services.   SDOH (Social Determinants of Health) assessments and interventions performed:    CCM Care Plan  No Known Allergies  Outpatient Encounter Medications as of 11/06/2020  Medication Sig   amLODipine (NORVASC) 5 MG tablet Take 1 tablet (5 mg total) by mouth daily.   aspirin EC 81 MG tablet Take 81 mg by mouth daily.   atorvastatin (LIPITOR) 10 MG tablet Take one tablet by mouth M - F   escitalopram (LEXAPRO) 5 MG tablet Take 1 tablet (5 mg total) by mouth daily.   Memantine HCl-Donepezil HCl (NAMZARIC) 28-10 MG CP24 Take 1 capsule by mouth daily.   Vitamin D, Ergocalciferol, (DRISDOL) 1.25 MG (50000 UNIT) CAPS capsule Take 1 capsule (50,000 Units total) by mouth every 7 (seven) days.   No facility-administered encounter medications on file as of 11/06/2020.    Patient Active Problem List   Diagnosis Date Noted   Dementia ([place]) 10/19/2020   Essential hypertension 10/19/2020    Mixed hyperlipidemia 10/19/2020   Vitamin D deficiency 10/19/2020   Depression 10/19/2020    Conditions to be addressed/monitored: HTN, Dementia, Vitamin D deficiency   Care Plan : General Plan of Care (Adult)  Updates made by Eugene Logan, RN since 11/10/2020 12:00 AM     Problem: Therapeutic Alliance (General Plan of Care)   Priority: High     Long-Range Goal: Therapeutic Alliance Established   Start Date: 08/03/2020  Expected End Date: 02/03/2021  Recent Progress: On track  Priority: High  Note:   Current Barriers:  Ineffective Self Health Maintenance Unable to self administer medications as prescribed Does not adhere to prescribed medication regimen Lacks social connections Does not maintain contact with provider office Clinical Goal(s):  Collaboration with [name], [place] regarding development and update of comprehensive plan of care as evidenced by provider attestation and co-signature Inter-disciplinary care team collaboration (see longitudinal plan of care) patient will work with care management team to address care coordination and chronic disease management needs related to Disease Management Educational Needs Care Coordination Medication Management and Education Medication Reconciliation Psychosocial Support Dementia and Caregiver Support Level of Care Concerns   Interventions:  11/06/20 completed successful outbound call with wife [name] Evaluation of current treatment plan related to Effective Health Care Maintenance self-management and patient's adherence to plan as established by provider. Collaboration with Eugene Brine, FNP regarding development and update of comprehensive plan of care as evidenced by provider attestation       and co-signature Inter-disciplinary care team collaboration (see longitudinal plan of care) Determined Mrs. [name]  has not been able to locate the documentation requested by the [place] in order for [name] to be evaluated  for VA benefits Discussed her son is assisting her with this matter and is working on obtaining the information needed by the [place]  Determined patient will remain to be under the care of Eugene Brine FNP at this time  Discussed patient and wife using Adult daycare options to allow [name] to engage in social activities while providing respite for [name] with embedded [place] regarding resources to be shared with [name] at next available BSW follow up call  Discussed [name] next PCP follow up visit with Eugene Brine FNP scheduled for 11/19/20 @4 :00 PM  Discussed plans with patient for ongoing care management follow up and provided patient with direct contact information for care management team Patient [name] Activities:  - Contact the [facility] office as directed for assistance with applying for appropriate VA benefits - Contact the [facility] in [place], [place] to scheduled dental and vision appointments  - Keep all scheduled MD follow up appointments - Call the CCM team and or PCP for questions or concerns and or care coordination needs          Follow Up Plan: Telephone follow up appointment with care management team member scheduled for: 12/03/20     Problem: Medication Inadherence   Priority: High     Long-Range Goal: Medication Management - optimized   Start Date: 08/03/2020  Expected End Date: 08/04/2022  This Visit's Progress: On track  Priority: High  Note:   Current Barriers:  Ineffective Self Health Maintenance  Unable to self administer medications as prescribed Lacks social connections Unable to perform ADLs independently Unable to perform IADLs independently Cognitive deficits  Clinical Goal(s):  Collaboration with Eugene Brine, FNP regarding development and update of comprehensive plan of care as evidenced by provider attestation and co-signature Inter-disciplinary care team collaboration (see longitudinal plan of  care) patient will work with care management team to address care coordination and chronic disease management needs related to Disease Management Educational Needs Care Coordination Medication Management and Education Medication Reconciliation Psychosocial Support Dementia and Caregiver Support   Interventions:  11/06/20 completed successful outbound call with wife [name]  Evaluation of current treatment plan related to HTN, Dementia, Vitamin D deficiency , self-management and patient's adherence to plan as established by provider. Collaboration with Eugene Brine, FNP regarding development and update of comprehensive plan of care as evidenced by provider attestation       and co-signature Inter-disciplinary care team collaboration (see longitudinal plan of care) Determined patient's wife continues to administer his medications Discussed wife puts his medications out in front of him and directs him to take them with food/drink and monitors him while taking the medications, he sometimes declines  Suggested wife crush the medications to mix in food unless otherwise directed by pharmacist, she may consider   Discussed plans with patient for ongoing care management follow up and provided patient with direct contact information for care management team Self Care Activities:  Attends all scheduled provider appointments Calls pharmacy for medication refills Calls provider office for new concerns or questions Patient Goals: - call for medicine refill 2 or 3 days before it runs out - call if I am sick and can't take my medicine - keep a list of all the medicines I take; vitamins and herbals too - learn to read medicine labels - use a pillbox to sort  medicine - use an alarm clock or phone to remind me to take my medicine   Follow Up Plan: Telephone follow up appointment with care management team member scheduled for: 12/03/20    Plan:Telephone follow up appointment with care management team  member scheduled for:  12/03/20  Barb Merino, RN, BSN, CCM Care Management Coordinator [place] [facility]  Direct Phone: [phone]

## 2020-11-10 NOTE — Patient Instructions (Signed)
Goals Addressed      Manage My Medicine   On track    Timeframe:  Short-Term Goal Priority:  High Start Date: 08/03/20                            Expected End Date:  08/03/21                     Follow Up Date: 12/03/20   - call for medicine refill 2 or 3 days before it runs out - call if I am sick and can't take my medicine - keep a list of all the medicines I take; vitamins and herbals too - learn to read medicine labels - use a pillbox to sort medicine - use an alarm clock or phone to remind me to take my medicine    Why is this important?   These steps will help you keep on track with your medicines.   Notes:       Therapeutic Alliance Established   On track    Timeframe:  Short-Term Goal Priority:  High Start Date:  08/03/20                           Expected End Date:  02/05/21  Next Follow up date:  12/03/20   - Contact the [place] office as directed for assistance with applying for appropriate VA benefits - Contact the [facility] in [place], [place] to scheduled dental and vision appointments  - Keep all scheduled MD follow up appointments - Call the CCM team and or PCP for questions or concerns and or care coordination needs

## 2020-11-11 ENCOUNTER — Telehealth: Payer: Medicare Other

## 2020-11-11 ENCOUNTER — Telehealth: Payer: Self-pay

## 2020-11-11 NOTE — Telephone Encounter (Signed)
  Care Management   Follow Up Note   11/11/2020 Name: [name] MRN: [id number] DOB: 1945-07-02   Referred by: [name], FNP Reason for referral : Chronic Care Management (Unsuccessful call)   SW placed an unsuccessful outbound call to the patients spouse in response to collaboration with [place] who indicates Mrs. Seiden is interested in completing a SCAT application on behalf of the patient. SW left a HIPAA compliant voice message requesting a return call.  Follow Up Plan: The care management team will reach out to the patient again over the next 21 days.   [name], BSW, CDP Social Worker, Certified Dementia Practitioner [place] [facility] [place] [facility] [phone]

## 2020-11-17 ENCOUNTER — Telehealth: Payer: Self-pay

## 2020-11-17 ENCOUNTER — Telehealth: Payer: Medicare Other

## 2020-11-17 NOTE — Telephone Encounter (Signed)
  Care Management   Follow Up Note   11/17/2020 Name: YOUNG MULVEY MRN: [id number] DOB: [birth year]   Referred by: [name], FNP Reason for referral : Chronic Care Management (Unsuccessful call attempt x2)   A second unsuccessful telephone outreach was attempted today. The patient was referred to the case management team for assistance with care management and care coordination. SW left a HIPAA compliant voice message requesting a return call.  Follow Up Plan: The care management team will reach out to the patient again over the next 30 days.   [name], BSW, CDP Social Worker, Certified Dementia Practitioner [place] [facility] [place] [facility] [phone]

## 2020-11-19 ENCOUNTER — Ambulatory Visit: Payer: Medicare Other | Admitting: Nurse Practitioner

## 2020-12-03 ENCOUNTER — Telehealth: Payer: Medicare Other

## 2020-12-07 ENCOUNTER — Telehealth: Payer: Self-pay

## 2020-12-07 ENCOUNTER — Telehealth: Payer: Medicare Other

## 2020-12-07 NOTE — Telephone Encounter (Signed)
  Care Management   Follow Up Note   12/07/2020 Name: CLAYVON PARLETT MRN: [id number] DOB: 09-May-1946   Referred by: [name], FNP Reason for referral : Chronic Care Management (Unsuccessful call)   SW placed a fourth unsuccessful outbound call to the patients spouse to assist with resource needs. SW left a HIPAA compliant voice message requesting a return call.  Follow Up Plan: SW will attempt a fourth and final outreach over the next 21 days.  [name], BSW, CDP Social Worker, Certified Dementia Practitioner [place] [facility] [place] [facility] [phone]

## 2020-12-18 ENCOUNTER — Other Ambulatory Visit: Payer: Self-pay

## 2020-12-18 ENCOUNTER — Telehealth: Payer: Self-pay

## 2020-12-18 DIAGNOSIS — E559 Vitamin D deficiency, unspecified: Secondary | ICD-10-CM

## 2020-12-18 MED ORDER — VITAMIN D (ERGOCALCIFEROL) 1.25 MG (50000 UNIT) PO CAPS: 50000.0000 [IU] | ORAL_CAPSULE | ORAL | 1 refills | Status: AC

## 2020-12-18 NOTE — Chronic Care Management (AMB) (Addendum)
Chronic Care Management Pharmacy Assistant   Name: [name]  MRN: [id number] DOB: 28-Aug-1945   Reason for Encounter: Medication Review/ Medication coordination, Hypertension   Recent office visits:  10-19-2020 [name] (CCM)  10-20-2020 [name] (CCM)  10-22-2020 [name] (CCM)  11-06-2020 Little, [name], RN (CCM)   Recent consult visits:  None  Hospital visits:  None in previous 6 months  Medications: Outpatient Encounter Medications as of 12/18/2020  Medication Sig   amLODipine (NORVASC) 5 MG tablet Take 1 tablet (5 mg total) by mouth daily.   aspirin EC 81 MG tablet Take 81 mg by mouth daily.   atorvastatin (LIPITOR) 10 MG tablet Take one tablet by mouth M - F   escitalopram (LEXAPRO) 5 MG tablet Take 1 tablet (5 mg total) by mouth daily.   Memantine HCl-Donepezil HCl (NAMZARIC) 28-10 MG CP24 Take 1 capsule by mouth daily.   Vitamin D, Ergocalciferol, (DRISDOL) 1.25 MG (50000 UNIT) CAPS capsule Take 1 capsule (50,000 Units total) by mouth every 7 (seven) days.   No facility-administered encounter medications on file as of 12/18/2020.   Reviewed chart prior to disease state call. Spoke with patient regarding BP  Recent Office Vitals: BP Readings from Last 3 Encounters:  08/04/20 130/72  04/08/20 (!) 142/84  04/08/20 (!) 142/84   Pulse Readings from Last 3 Encounters:  08/04/20 64  04/08/20 63  04/08/20 63    Wt Readings from Last 3 Encounters:  08/04/20 141 lb 9.6 oz (64.2 kg)  04/08/20 138 lb (62.6 kg)  04/08/20 138 lb (62.6 kg)     Kidney Function Lab Results  Component Value Date/Time   CREATININE 1.07 04/08/2020 12:41 PM   CREATININE 1.15 07/26/2018 12:56 PM   GFRNONAA 68 04/08/2020 12:41 PM   GFRAA 78 04/08/2020 12:41 PM    BMP Latest Ref Rng & Units 04/08/2020 07/26/2018  Glucose 65 - 99 mg/dL 96 84  BUN 8 - 27 mg/dL 17 15  Creatinine 0.76 - 1.27 mg/dL 1.07 1.15  BUN/Creat Ratio 10 - '24 16 13  '$ Sodium 134 - 144  mmol/L 143 142  Potassium 3.5 - 5.2 mmol/L 5.2 5.0  Chloride 96 - 106 mmol/L 105 106  CO2 20 - 29 mmol/L 24 22  Calcium 8.6 - 10.2 mg/dL 9.8 9.7    Current antihypertensive regimen:  Amlodipine 5 mg daily  How often are you checking your Blood Pressure? Patients wife states patient doesn't check blood pressure at home.  Current home BP readings: None  What recent interventions/DTPs have been made by any provider to improve Blood Pressure control since last CPP Visit: Educated on Daily salt intake goal < 2300 mg Counseled to monitor BP at home at least once per week, document, and provide log at future appointments Counseled on diet and exercise extensively  Any recent hospitalizations or ED visits since last visit with CPP? No  What diet changes have been made to improve Blood Pressure Control?  Patient's wife states he is a very picky eater. Patient eats a lot of bread and drives a lot of coffee.  What exercise is being done to improve your Blood Pressure Control?  Patient's wife states his dementia has him sleep during the day and up all night walking up and down the hallway.  Adherence Review: Is the patient currently on ACE/ARB medication? No Does the patient have >5 day gap between last estimated fill dates? No  NOTES: Made patient a telephone appointment with [facility]  CPP on 01-20-2021.  Reviewed chart for medication changes ahead of medication coordination call.  No OVs, Consults, or hospital visits since last care coordination call/Pharmacist visit. (If appropriate, list visit date, provider name)  No medication changes indicated OR if recent visit, treatment plan here.  BP Readings from Last 3 Encounters:  08/04/20 130/72  04/08/20 (!) 142/84  04/08/20 (!) 142/84    Lab Results  Component Value Date   HGBA1C 5.5 04/08/2020     Patient obtains medications through Adherence Packaging  90 Days   Last adherence delivery included: Atorvastatin 10 mg one  tablet with evening meals M-F Amlodipine 5 mg one tablet with breakfast Lexapro 5 mg one tablet with evening meals Vitamin D2 1250 mcg daily  Patient declined (meds) last month: None due to receiving 90 day supply on 10-01-2020   Patient is due for next adherence delivery on: 12-29-2020  Called patient's wife and reviewed medications and coordinated delivery.  This delivery to include: Atorvastatin 10 mg one tablet with evening meals M-F Amlodipine 5 mg one tablet with breakfast Lexapro 5 mg one tablet with evening meals Vitamin D2 1250 mcg daily  No short or acute fills needed  Patient declined the following medications: None  Patient needs refills for: Vitamin D2 1250 mcg  Confirmed delivery date of 12-29-2020 advised patient that pharmacy will contact them the morning of delivery.  Care Gaps: Shingrix overdue Covid booster overdue PNA Vac overdue RAF= 0.69% Medicare wellness 04-14-2021  Star Rating Drugs: Atorvastatin 10 mg- Last filled 10-01-2020 90 DS [facility]  [place] Clinical Pharmacist Assistant [phone]

## 2020-12-29 ENCOUNTER — Ambulatory Visit (INDEPENDENT_AMBULATORY_CARE_PROVIDER_SITE_OTHER): Payer: Medicare Other

## 2020-12-29 DIAGNOSIS — I10 Essential (primary) hypertension: Secondary | ICD-10-CM

## 2020-12-29 DIAGNOSIS — F0391 Unspecified dementia with behavioral disturbance: Secondary | ICD-10-CM

## 2020-12-30 NOTE — Chronic Care Management (AMB) (Signed)
Chronic Care Management    Social Work Note  12/29/2020 Name: [name] MRN: [id number] DOB: 16-Aug-1945  [name] is a 75 y.o. year old male who is a primary care patient of [name], [place]. The CCM team was consulted to assist the patient with chronic disease management and/or care coordination needs related to: Level of Care Concerns.   Engaged with patients spouse by phone  for follow up visit in response to provider referral for social work chronic care management and care coordination services.   Consent to Services:  The patient was given information about Chronic Care Management services, agreed to services, and gave verbal consent prior to initiation of services.  Please see initial visit note for detailed documentation.   Patient agreed to services and consent obtained.   Assessment: Review of patient past medical history, allergies, medications, and health status, including review of relevant consultants reports was performed today as part of a comprehensive evaluation and provision of chronic care management and care coordination services.     SDOH (Social Determinants of Health) assessments and interventions performed:    Advanced Directives Status: Not addressed in this encounter.  CCM Care Plan  No Known Allergies  Outpatient Encounter Medications as of 12/29/2020  Medication Sig   amLODipine (NORVASC) 5 MG tablet Take 1 tablet (5 mg total) by mouth daily.   aspirin EC 81 MG tablet Take 81 mg by mouth daily.   atorvastatin (LIPITOR) 10 MG tablet Take one tablet by mouth M - F   escitalopram (LEXAPRO) 5 MG tablet Take 1 tablet (5 mg total) by mouth daily.   Memantine HCl-Donepezil HCl (NAMZARIC) 28-10 MG CP24 Take 1 capsule by mouth daily.   Vitamin D, Ergocalciferol, (DRISDOL) 1.25 MG (50000 UNIT) CAPS capsule Take 1 capsule (50,000 Units total) by mouth every 7 (seven) days.   No facility-administered encounter medications on file as of 12/29/2020.     Patient Active Problem List   Diagnosis Date Noted   Dementia ([place]) 10/19/2020   Essential hypertension 10/19/2020   Mixed hyperlipidemia 10/19/2020   Vitamin D deficiency 10/19/2020   Depression 10/19/2020    Conditions to be addressed/monitored: HTN and Dementia; Limited access to caregiver  Care Plan : Social Work [place]  Updates made by [name] since 12/30/2020 12:00 AM  Completed 12/30/2020   Problem: Disease Progression - Dementia Resolved 12/30/2020     Long-Range Goal: Disease Progression Managed with Education and Resources Completed 12/30/2020  Start Date: 07/15/2020  Expected End Date: 11/12/2020  Priority: High  Note:   Current Barriers:  Level of care concerns Memory Deficits Chronic conditions including HTN and Dementia  Social Work Clinical Goal(s):  Over the next 120 days the patient and his spouse will work with SW to identify resources to assist with disease progression Over the next 45 days the patient and his spouse will work with SW to identify placement options  Interventions: 1:1 collaboration with [name], [place] regarding development and update of comprehensive plan of care as evidenced by provider attestation and co-signature Inter-disciplinary care team collaboration (see longitudinal plan of care) Successful outbound call placed to the patients spouse [name] to assess goal progression Discussed the patient is doing well in the home Mrs. Doctor and her sons are working to get patient enrolled in the [place], they are awaiting a packet via mail Determined there are no SW needs at this time Discussed plans for SW to do a follow up call over  the next 90 days prior to SW signing off  Patient Goals/Self-Care Activities  patient will: With the help of his spouse  -  Work with son to complete VA paperwork -Contact SW as needed       Follow Up Plan: SW will follow up with patient by phone over the next 90 days      [name],  BSW, CDP Social Worker, Certified Dementia Practitioner [place] [facility] [place] [facility] [phone]

## 2020-12-30 NOTE — Patient Instructions (Signed)
Social Worker Visit Information  Goals we discussed today:   Goals Addressed             This Visit's Progress    COMPLETED: Disease Progression Managed       Timeframe:  Long-Range Goal Priority:  High Start Date:     2.16.22                        Expected End Date: 6.16.22                      Next planned outreach date: 11.2.22.  Patient Goals/Self-Care Activities  patient will: With the help of his spouse  - Work with son to complete VA paperwork -Contact SW as needed          Follow Up Plan: SW will follow up with patient by phone over the next 90 days   [name], BSW, CDP Social Worker, Certified Dementia Practitioner [place] [facility] [place] [facility] [phone]

## 2021-01-04 ENCOUNTER — Ambulatory Visit: Payer: Self-pay

## 2021-01-04 ENCOUNTER — Telehealth: Payer: Medicare Other

## 2021-01-04 DIAGNOSIS — E559 Vitamin D deficiency, unspecified: Secondary | ICD-10-CM

## 2021-01-04 DIAGNOSIS — I10 Essential (primary) hypertension: Secondary | ICD-10-CM

## 2021-01-04 DIAGNOSIS — F0391 Unspecified dementia with behavioral disturbance: Secondary | ICD-10-CM

## 2021-01-08 NOTE — Patient Instructions (Signed)
Goals Addressed      Manage My Medicine   On track    Timeframe:  Short-Term Goal Priority:  High Start Date: 08/03/20                            Expected End Date:  08/03/21                     Follow Up Date: 04/16/21   - call for medicine refill 2 or 3 days before it runs out - call if I am sick and can't take my medicine - keep a list of all the medicines I take; vitamins and herbals too - learn to read medicine labels - use a pillbox to sort medicine - use an alarm clock or phone to remind me to take my medicine    Why is this important?   These steps will help you keep on track with your medicines.   Notes:      Therapeutic Alliance Established   On track    Timeframe:  Short-Term Goal Priority:  High Start Date:  08/03/20                           Expected End Date:  08/03/21  Next Follow up date:  04/16/21   - Contact the [place] office as directed for assistance with applying for appropriate VA benefits - Contact the [facility] in [place], [place] to scheduled dental and vision appointments  - Keep all scheduled MD follow up appointments - Call the CCM team and or PCP for questions or concerns and or care coordination needs

## 2021-01-08 NOTE — Chronic Care Management (AMB) (Signed)
Chronic Care Management   CCM RN Visit Note  01/04/2021 Name: [name] MRN: [id number] DOB: Sep 24, 1945  Subjective: [name] is a 75 y.o. year old male who is a primary care patient of [name], [place]. The care management team was consulted for assistance with disease management and care coordination needs.    Engaged with patient by telephone for follow up visit in response to provider referral for case management and/or care coordination services.   Consent to Services:  The patient was given information about Chronic Care Management services, agreed to services, and gave verbal consent prior to initiation of services.  Please see initial visit note for detailed documentation.   Patient agreed to services and verbal consent obtained.   Assessment: Review of patient past medical history, allergies, medications, health status, including review of consultants reports, laboratory and other test data, was performed as part of comprehensive evaluation and provision of chronic care management services.   SDOH (Social Determinants of Health) assessments and interventions performed:    CCM Care Plan  No Known Allergies  Outpatient Encounter Medications as of 01/04/2021  Medication Sig   amLODipine (NORVASC) 5 MG tablet Take 1 tablet (5 mg total) by mouth daily.   aspirin EC 81 MG tablet Take 81 mg by mouth daily.   atorvastatin (LIPITOR) 10 MG tablet Take one tablet by mouth M - F   escitalopram (LEXAPRO) 5 MG tablet Take 1 tablet (5 mg total) by mouth daily.   Memantine HCl-Donepezil HCl (NAMZARIC) 28-10 MG CP24 Take 1 capsule by mouth daily.   Vitamin D, Ergocalciferol, (DRISDOL) 1.25 MG (50000 UNIT) CAPS capsule Take 1 capsule (50,000 Units total) by mouth every 7 (seven) days.   No facility-administered encounter medications on file as of 01/04/2021.    Patient Active Problem List   Diagnosis Date Noted   Dementia ([place]) 10/19/2020   Essential hypertension 10/19/2020    Mixed hyperlipidemia 10/19/2020   Vitamin D deficiency 10/19/2020   Depression 10/19/2020    Conditions to be addressed/monitored: HTN, Dementia, Vitamin D deficiency   Care Plan : General Plan of Care (Adult)  Updates made by Eugene Logan, RN since 01/04/2021 12:00 AM     Problem: Therapeutic Alliance (General Plan of Care)   Priority: High     Long-Range Goal: Therapeutic Alliance Established   Start Date: 08/03/2020  Expected End Date: 08/03/2021  Recent Progress: On track  Priority: High  Note:   Current Barriers:  Ineffective Self Health Maintenance Unable to self administer medications as prescribed Does not adhere to prescribed medication regimen Lacks social connections Does not maintain contact with provider office Clinical Goal(s):  Collaboration with [name], [place] regarding development and update of comprehensive plan of care as evidenced by provider attestation and co-signature Inter-disciplinary care team collaboration (see longitudinal plan of care) patient will work with care management team to address care coordination and chronic disease management needs related to Disease Management Educational Needs Care Coordination Medication Management and Education Medication Reconciliation Psychosocial Support Dementia and Caregiver Support Level of Care Concerns   Interventions:  01/04/21 completed successful outbound call with wife [name] Evaluation of current treatment plan related to Effective Health Care Maintenance self-management and patient's adherence to plan as established by provider. Collaboration with Eugene Brine, FNP regarding development and update of comprehensive plan of care as evidenced by provider attestation       and co-signature Inter-disciplinary care team collaboration (see longitudinal plan of care) Determined [name]  continues to be [name] primary caregiver Assessed for barriers to providing care including  transportation, she denies having concerns at this time Determined [name] continues to have his sleep pattern altered and [name] supervises  Discussed [name] next PCP follow up visit with Eugene Brine FNP scheduled for 04/14/21 '@10'$  AM Mailed printed educational material related to Elderly Nutrition  Discussed plans with patient for ongoing care management follow up and provided patient with direct contact information for care management team Patient [name] Activities:  - Keep all scheduled MD follow up appointments - Call the CCM team and or PCP for questions or concerns and or care coordination needs          Follow Up Plan: Telephone follow up appointment with care management team member scheduled for: 04/16/21     Problem: Medication Inadherence   Priority: High     Long-Range Goal: Medication Management - optimized   Start Date: 08/03/2020  Expected End Date: 08/03/2021  Recent Progress: On track  Priority: High  Note:   Current Barriers:  Ineffective Self Health Maintenance  Unable to self administer medications as prescribed Lacks social connections Unable to perform ADLs independently Unable to perform IADLs independently Cognitive deficits  Clinical Goal(s):  Collaboration with Eugene Brine, FNP regarding development and update of comprehensive plan of care as evidenced by provider attestation and co-signature Inter-disciplinary care team collaboration (see longitudinal plan of care) patient will work with care management team to address care coordination and chronic disease management needs related to Disease Management Educational Needs Care Coordination Medication Management and Education Medication Reconciliation Psychosocial Support Dementia and Caregiver Support   Interventions:  01/04/21 completed successful outbound call with wife [name]  Evaluation of current treatment plan related to HTN, Dementia, Vitamin D deficiency , self-management and  patient's adherence to plan as established by provider. Collaboration with Eugene Brine, FNP regarding development and update of comprehensive plan of care as evidenced by provider attestation       and co-signature Inter-disciplinary care team collaboration (see longitudinal plan of care) Review of patient status, including review of consultant's reports, relevant laboratory and other test results, and medications completed. Reviewed medications with patient and discussed importance of medication adherence Determined patient's wife continues to administer his medications Discussed wife puts his medications out in front of him and directs him to take them with food/drink and monitors him while taking the medications, he sometimes declines  Suggested wife crush the medications to mix in food unless otherwise directed by pharmacist, she may consider   Discussed plans with patient for ongoing care management follow up and provided patient with direct contact information for care management team Self Care Activities:  Attends all scheduled provider appointments Calls pharmacy for medication refills Calls provider office for new concerns or questions Patient Goals: - call for medicine refill 2 or 3 days before it runs out - call if I am sick and can't take my medicine - keep a list of all the medicines I take; vitamins and herbals too - learn to read medicine labels - use a pillbox to sort medicine - use an alarm clock or phone to remind me to take my medicine   Follow Up Plan: Telephone follow up appointment with care management team member scheduled for: 04/16/21     Plan:Telephone follow up appointment with care management team member scheduled for:  04/16/21  Barb Merino, RN, BSN, CCM Care Management Coordinator [place] [facility]  Direct  Phone: [phone]

## 2021-01-19 ENCOUNTER — Telehealth: Payer: Self-pay

## 2021-01-19 NOTE — Chronic Care Management (AMB) (Signed)
    [place], No answer, left message of appointment on 01-20-2021 at 11:15 via telephone visit with Orlando Penner, Pharm D. Notified to have all medications, supplements, blood pressure and/or blood sugar logs available during appointment and to return call if need to reschedule.   Care Gaps: Shingrix overdue Covid booster overdue PNA Vac overdue RAF= 0.69% Medicare wellness 04-14-2021  Star Rating Drug: Atorvastatin 10 mg- Last filled 12-23-2020 90 DS [facility]    Any gaps in medications fill history? No  [place] Pharmacist Assistant [phone]

## 2021-01-20 ENCOUNTER — Telehealth: Payer: Self-pay

## 2021-01-25 ENCOUNTER — Telehealth: Payer: Self-pay

## 2021-01-25 NOTE — Chronic Care Management (AMB) (Signed)
    Chronic Care Management Pharmacy Assistant   Name: [name]  MRN: [id number] DOB: 04-04-1946   Reason for Encounter: Disease State/ Hypertension, Hyperlipidemia   Recent office visits:  12-29-2020 [name] (CCM)  01-04-2021 Little, [name], RN (CCM)  Recent consult visits:  None  Hospital visits:  None in previous 6 months  Medications: Outpatient Encounter Medications as of 01/25/2021  Medication Sig   amLODipine (NORVASC) 5 MG tablet Take 1 tablet (5 mg total) by mouth daily.   aspirin EC 81 MG tablet Take 81 mg by mouth daily.   atorvastatin (LIPITOR) 10 MG tablet Take one tablet by mouth M - F   escitalopram (LEXAPRO) 5 MG tablet Take 1 tablet (5 mg total) by mouth daily.   Memantine HCl-Donepezil HCl (NAMZARIC) 28-10 MG CP24 Take 1 capsule by mouth daily.   Vitamin D, Ergocalciferol, (DRISDOL) 1.25 MG (50000 UNIT) CAPS capsule Take 1 capsule (50,000 Units total) by mouth every 7 (seven) days.   No facility-administered encounter medications on file as of 01/25/2021.   Reviewed chart prior to disease state call. Spoke with patient regarding BP  Recent Office Vitals: BP Readings from Last 3 Encounters:  08/04/20 130/72  04/08/20 (!) 142/84  04/08/20 (!) 142/84   Pulse Readings from Last 3 Encounters:  08/04/20 64  04/08/20 63  04/08/20 63    Wt Readings from Last 3 Encounters:  08/04/20 141 lb 9.6 oz (64.2 kg)  04/08/20 138 lb (62.6 kg)  04/08/20 138 lb (62.6 kg)     Kidney Function Lab Results  Component Value Date/Time   CREATININE 1.07 04/08/2020 12:41 PM   CREATININE 1.15 07/26/2018 12:56 PM   GFRNONAA 68 04/08/2020 12:41 PM   GFRAA 78 04/08/2020 12:41 PM    BMP Latest Ref Rng & Units 04/08/2020 07/26/2018  Glucose 65 - 99 mg/dL 96 84  BUN 8 - 27 mg/dL 17 15  Creatinine 0.76 - 1.27 mg/dL 1.07 1.15  BUN/Creat Ratio 10 - '24 16 13  '$ Sodium 134 - 144 mmol/L 143 142  Potassium 3.5 - 5.2 mmol/L 5.2 5.0  Chloride 96 - 106 mmol/L 105 106   CO2 20 - 29 mmol/L 24 22  Calcium 8.6 - 10.2 mg/dL 9.8 9.7      01-25-2021: 1st attempt left VM 01-26-2021: 2nd attempt left VM  Care Gaps: Shingrix overdue Covid booster overdue PNA Vac overdue RAF= 0.69% Medicare wellness 04-14-2021  Star Rating Drugs: Atorvastatin 10 mg- Last filled 12-23-2020 90 DS [facility]  [place] Clinical Pharmacist Assistant [phone]

## 2021-02-15 ENCOUNTER — Telehealth: Payer: Medicare Other

## 2021-02-15 ENCOUNTER — Telehealth: Payer: Self-pay

## 2021-02-15 NOTE — Telephone Encounter (Signed)
  Care Management   Follow Up Note   02/15/2021 Name: [name] MRN: [id number] DOB: [birth year]   Referred by: [name], FNP Reason for referral : Chronic Care Management (RN CM Follow up call )   An unsuccessful telephone outreach was attempted today. The patient was referred to the case management team for assistance with care management and care coordination.   Follow Up Plan: A HIPPA compliant phone message was left for the patient providing contact information and requesting a return call.   Barb Merino, RN, BSN, CCM Care Management Coordinator [place] [facility]  Direct Phone: [phone]

## 2021-03-13 ENCOUNTER — Other Ambulatory Visit: Payer: Self-pay | Admitting: Nurse Practitioner

## 2021-03-13 DIAGNOSIS — F3289 Other specified depressive episodes: Secondary | ICD-10-CM

## 2021-03-13 DIAGNOSIS — I10 Essential (primary) hypertension: Secondary | ICD-10-CM

## 2021-03-17 ENCOUNTER — Telehealth: Payer: Self-pay

## 2021-03-17 NOTE — Chronic Care Management (AMB) (Addendum)
    Chronic Care Management Pharmacy Assistant   Name: [name]  MRN: [id number] DOB: 07/11/1945   Reason for Encounter: Medication Review/ Medication coordination   Recent office visits:  12-29-2020 [name] (CCM)  01-04-2021 Little, [name], RN (CCM)  Recent consult visits:  None  Hospital visits:  None in previous 6 months  Medications: Outpatient Encounter Medications as of 03/17/2021  Medication Sig   amLODipine (NORVASC) 5 MG tablet TAKE ONE TABLET BY MOUTH ONCE DAILY   aspirin EC 81 MG tablet Take 81 mg by mouth daily.   atorvastatin (LIPITOR) 10 MG tablet Take one tablet by mouth M - F   escitalopram (LEXAPRO) 5 MG tablet TAKE ONE TABLET BY MOUTH EVERY EVENING   Memantine HCl-Donepezil HCl (NAMZARIC) 28-10 MG CP24 Take 1 capsule by mouth daily.   Vitamin D, Ergocalciferol, (DRISDOL) 1.25 MG (50000 UNIT) CAPS capsule Take 1 capsule (50,000 Units total) by mouth every 7 (seven) days.   No facility-administered encounter medications on file as of 03/17/2021.  Reviewed chart for medication changes ahead of medication coordination call.  No OVs, Consults, or hospital visits since last care coordination call/Pharmacist visit. (If appropriate, list visit date, provider name)  No medication changes indicated OR if recent visit, treatment plan here.  BP Readings from Last 3 Encounters:  08/04/20 130/72  04/08/20 (!) 142/84  04/08/20 (!) 142/84    Lab Results  Component Value Date   HGBA1C 5.5 04/08/2020     Patient obtains medications through Adherence Packaging  90 Days   Last adherence delivery included:  Atorvastatin 10 mg one tablet with evening meals M-F Amlodipine 5 mg one tablet with breakfast Lexapro 5 mg one tablet with evening meals Vitamin D2 1250 mcg on sundays  Patient declined (meds) last month: None  Patient is due for next adherence delivery on: 04-01-2021  Called patient and reviewed medications and coordinated delivery.  This  delivery to include: Atorvastatin 10 mg one tablet with evening meals M-F Amlodipine 5 mg one tablet with breakfast Lexapro 5 mg one tablet with evening meals Vitamin D2 1250 mcg on sundays  No short/acute fill needed  Patient declined the following medications: None  Patient needs refills for: Amlodipine Atorvastatin Lexapro  Confirmed delivery date of 04-01-2021 advised patient that pharmacy will contact them the morning of delivery.  NOTES: Patient's wife states patient is running low on Namzaric. Informed patient that [facility] and [facility]. Called patient's wife back to inform her that medication is being filled.  Care Gaps: Shingrix overdue Covid booster overdue last completed 08-13-2019 Flu vaccine overdue last completed 04-08-2020 Medicare wellness 04-14-2021  Star Rating Drugs: Atorvastatin 10 mg- Last filled 12-23-2020 90 DS [facility]  [place] Clinical Pharmacist Assistant [phone]

## 2021-03-18 ENCOUNTER — Telehealth: Payer: Medicare Other

## 2021-03-18 ENCOUNTER — Telehealth: Payer: Self-pay

## 2021-03-18 NOTE — Telephone Encounter (Signed)
  Care Management   Follow Up Note   03/18/2021 Name: GRAM SIEDLECKI MRN: [id number] DOB: [birth year]   Referred by: [name], FNP Reason for referral : Chronic Care Management (Inbound call from wife )   An unsuccessful telephone outreach was attempted today. The patient was referred to the case management team for assistance with care management and care coordination.   Follow Up Plan: A HIPPA compliant phone message was left for the patient providing contact information and requesting a return call.   Barb Merino, RN, BSN, CCM Care Management Coordinator [place] [facility]  Direct Phone: [phone]

## 2021-03-23 ENCOUNTER — Telehealth: Payer: Medicare Other

## 2021-03-23 ENCOUNTER — Ambulatory Visit (INDEPENDENT_AMBULATORY_CARE_PROVIDER_SITE_OTHER): Payer: Medicare Other

## 2021-03-23 DIAGNOSIS — E559 Vitamin D deficiency, unspecified: Secondary | ICD-10-CM

## 2021-03-23 DIAGNOSIS — F0150 Vascular dementia without behavioral disturbance: Secondary | ICD-10-CM

## 2021-03-23 DIAGNOSIS — I10 Essential (primary) hypertension: Secondary | ICD-10-CM

## 2021-03-23 NOTE — Progress Notes (Signed)
This encounter was created in error - please disregard.

## 2021-03-24 NOTE — Chronic Care Management (AMB) (Signed)
Chronic Care Management   CCM RN Visit Note  03/23/2021 Name: [name] MRN: [id number] DOB: 03/29/1946  Subjective: [name] is a 75 y.o. year old male who is a primary care patient of [name], [name]. The care management team was consulted for assistance with disease management and care coordination needs.    Engaged with patient by telephone for follow up visit in response to provider referral for case management and/or care coordination services.   Consent to Services:  The patient was given information about Chronic Care Management services, agreed to services, and gave verbal consent prior to initiation of services.  Please see initial visit note for detailed documentation.   Patient agreed to services and verbal consent obtained.   Assessment: Review of patient past medical history, allergies, medications, health status, including review of consultants reports, laboratory and other test data, was performed as part of comprehensive evaluation and provision of chronic care management services.   SDOH (Social Determinants of Health) assessments and interventions performed:  Yes, see care plan   CCM Care Plan  No Known Allergies  Outpatient Encounter Medications as of 03/23/2021  Medication Sig   amLODipine (NORVASC) 5 MG tablet TAKE ONE TABLET BY MOUTH ONCE DAILY   aspirin EC 81 MG tablet Take 81 mg by mouth daily.   atorvastatin (LIPITOR) 10 MG tablet Take one tablet by mouth M - F   escitalopram (LEXAPRO) 5 MG tablet TAKE ONE TABLET BY MOUTH EVERY EVENING   Memantine HCl-Donepezil HCl (NAMZARIC) 28-10 MG CP24 Take 1 capsule by mouth daily.   Vitamin D, Ergocalciferol, (DRISDOL) 1.25 MG (50000 UNIT) CAPS capsule Take 1 capsule (50,000 Units total) by mouth every 7 (seven) days.   No facility-administered encounter medications on file as of 03/23/2021.    Patient Active Problem List   Diagnosis Date Noted   Dementia ([place]) 10/19/2020   Essential hypertension  10/19/2020   Mixed hyperlipidemia 10/19/2020   Vitamin D deficiency 10/19/2020   Depression 10/19/2020    Conditions to be addressed/monitored: HTN, Dementia, Vitamin D deficiency   Care Plan : RN Care Manager Plan of Care  Updates made by Eugene Logan, RN since 03/24/2021 12:00 AM     Problem: No plan established for chronic disease management and Care Coordination needs for HTN, Dementia, Vitamin D deficiency   Priority: High     Long-Range Goal: Establish a plan of care for chronic disease management and Care Coordination needs forHTN, Dementia, Vitamin D deficiency   Start Date: 03/23/2021  Expected End Date: 03/23/2022  This Visit's Progress: On track  Priority: High  Note:   Current Barriers:  Knowledge Deficits related to plan of care for management of HTN, Dementia, Vitamin D deficiency  Chronic Disease Management support and education needs related to HTN, Dementia, Vitamin D deficiency  Cognitive Deficits Social Isolation   RNCM Clinical Goal(s):  Patient will take all medications exactly as prescribed and will call provider for medication related questions attend all scheduled medical appointments:   continue to work with RN Care Manager to address care management and care coordination needs related to  HTN, Dementia, Vitamin D deficiency  work with Education officer, museum to address  related to the management of Limited social support, Level of care concerns, ADL IADL limitations, Social Isolation, Cognitive Deficits, and Memory Deficits related to the management of    through collaboration with Consulting civil engineer, provider, and care team.   Interventions: 1:1 collaboration with primary care provider regarding development  and update of comprehensive plan of care as evidenced by provider attestation and co-signature Inter-disciplinary care team collaboration (see longitudinal plan of care) Evaluation of current treatment plan related to  self management and patient's adherence  to plan as established by provider  Goal on track:  Yes Evaluation of current treatment plan related to Dementia, Family and relationship dysfunction, Social Isolation, Cognitive Deficits, and Memory Deficits self-management and patient's adherence to plan as established by provider. Discussed plans with patient for ongoing care management follow up and provided patient with direct contact information for care management team Reviewed medications with patient and discussed patient is out of his Namzaric; determined wife [name] has not been able to drive to patient's pharmacy in order to pick up Namzaric before patient ran out of medication; Determined she would like for this [facility] in order to allow her to drive a shorter distance to pick up his medication; [facility] [address], spoke with pharmacist, requested all mediations be transferred to this pharmacy, requested patient's Namzaric be refilled today; Discussed mail order protocol, patient may have debit card on file and request medications be sent by [place] Postal service for which a fee will be charged and patient will receive medications in 2-3 days, wife [name] informed of mail order options, she will have a friend drive her today to pick up Namzaric, provided wife with the [facility] number and [facility] ; Discussed plans with patient for ongoing care management follow up and provided patient with direct contact information for care management team; Assessed social determinant of health barriers;  Health Maintenance Interventions:  (Status:  Goal on track:  NO.)  Wife [name] interviewed about adult health maintenance status for patient including the importance of receiving adequate health care for acute illness when needed and for ongoing annually physical's as directed for best management of chronic disease states and optimal health   Determined wife continues to have difficulty leaving their home to attend MD appointments for herself and Mr. [name] due to lack of caregiver support and impaired sleep pattern due to the need to care for Mr. [place] who continues to wander throughout the night time requiring her supervision Determined wife would like a provider who can make house calls for herself and Mr. [place], she has been unable to retrieve documentation required in order to get spouse established with the [place] wife regarding [employer], provided her with the contact number for this provider Placed outbound call to this provider to discuss referral process and obtain fax #, spoke with Levy Sjogren with PCP Eugene Brine, FNP regarding patient/wife's request to transition to this provider, advised demographics and insurance information is needed from PCP and can be faxed to [phone], reply from PCP advised she will send this information ASAP in order to help patient get transferred to [facility] Call MD provider Informed wife of efforts being made to accommodate her request, advised per Reanna from [employer], the turn-around time is about 2-3 days after receiving PCP information at which time a scheduler will contact her to make a new patient appointment, wife verbalizes understanding and would like to proceed  Patient Goals/Self-Care Activities: with assistance from wife [name] Patient will self administer medications as prescribed Patient will attend all scheduled provider appointments Patient will call pharmacy for medication refills Patient will call provider office for new concerns or questions Patient will work with  BSW to address care coordination needs and will continue to work with the clinical team to address health care and disease management related needs.    Follow Up Plan:  Telephone follow up appointment with care management team member scheduled for:  04/06/21      Plan:Telephone follow up appointment with care management team member scheduled for:  04/06/21  Barb Merino, RN, BSN, CCM Care Management Coordinator [place] [facility]  Direct Phone: [phone]

## 2021-03-24 NOTE — Patient Instructions (Signed)
Visit Information   Consent to CCM Services: Mr. Tallman was given information about Chronic Care Management services including:  CCM service includes personalized support from designated clinical staff supervised by his physician, including individualized plan of care and coordination with other care providers 24/7 contact phone numbers for assistance for urgent and routine care needs. Service will only be billed when office clinical staff spend 20 minutes or more in a month to coordinate care. Only one practitioner may furnish and bill the service in a calendar month. The patient may stop CCM services at any time (effective at the end of the month) by phone call to the office staff. The patient will be responsible for cost sharing (co-pay) of up to 20% of the service fee (after annual deductible is met).  Patient agreed to services and verbal consent obtained.   The patient verbalized understanding of instructions, educational materials, and care plan provided today and declined offer to receive copy of patient instructions, educational materials, and care plan.   Telephone follow up appointment with care management team member scheduled for: 04/06/21  Barb Merino, RN, BSN, CCM Care Management Coordinator [place] [facility]  Direct Phone: [phone]    CLINICAL CARE PLAN: Patient Care Plan: Social Work [facility] Care Plan  Completed 12/30/2020    Patient Care Plan: RN Care Manager Plan of Care     Problem Identified: No plan established for chronic disease management and Care Coordination needs for HTN, Dementia, Vitamin D deficiency   Priority: High     Long-Range Goal: Establish a plan of care for chronic disease management and Care Coordination needs forHTN, Dementia, Vitamin D deficiency   Start Date: 03/23/2021  Expected End Date: 03/23/2022  This Visit's Progress: On track  Priority: High  Note:   Current Barriers:  Knowledge Deficits  related to plan of care for management of HTN, Dementia, Vitamin D deficiency  Chronic Disease Management support and education needs related to HTN, Dementia, Vitamin D deficiency  Cognitive Deficits Social Isolation   RNCM Clinical Goal(s):  Patient will take all medications exactly as prescribed and will call provider for medication related questions attend all scheduled medical appointments:   continue to work with RN Care Manager to address care management and care coordination needs related to  HTN, Dementia, Vitamin D deficiency  work with Education officer, museum to address  related to the management of Limited social support, Level of care concerns, ADL IADL limitations, Social Isolation, Cognitive Deficits, and Memory Deficits related to the management of    through collaboration with Consulting civil engineer, provider, and care team.   Interventions: 1:1 collaboration with primary care provider regarding development and update of comprehensive plan of care as evidenced by provider attestation and co-signature Inter-disciplinary care team collaboration (see longitudinal plan of care) Evaluation of current treatment plan related to  self management and patient's adherence to plan as established by provider  Goal on track:  Yes Evaluation of current treatment plan related to Dementia, Family and relationship dysfunction, Social Isolation, Cognitive Deficits, and Memory Deficits self-management and patient's adherence to plan as established by provider. Discussed plans with patient for ongoing care management follow up and provided patient with direct contact information for care management team Reviewed medications with patient and discussed patient is out of his Namzaric; determined wife [name] has not been able to drive to patient's pharmacy in order to pick up Namzaric before patient ran out of medication; Determined she would like for this [facility] to  [facility] in [facility] to  allow her to drive a shorter distance to pick up his medication; [facility] [address], spoke with pharmacist, requested all mediations be transferred to this pharmacy, requested patient's Namzaric be refilled today; Discussed mail order protocol, patient may have debit card on file and request medications be sent by [place] Postal service for which a fee will be charged and patient will receive medications in 2-3 days, wife [name] informed of mail order options, she will have a friend drive her today to pick up Namzaric, provided wife with the [facility] number and [facility] ; Discussed plans with patient for ongoing care management follow up and provided patient with direct contact information for care management team; Assessed social determinant of health barriers;  Health Maintenance Interventions:  (Status:  Goal on track:  NO.)  Wife [name] interviewed about adult health maintenance status for patient including the importance of receiving adequate health care for acute illness when needed and for ongoing annually physical's as directed for best management of chronic disease states and optimal health  Determined wife continues to have difficulty leaving their home to attend MD appointments for herself and Mr. Abalos due to lack of caregiver support and impaired sleep pattern due to the need to care for Mr. Radi who continues to wander throughout the night time requiring her supervision Determined wife would like a provider who can make house calls for herself and Mr. Ingalls, she has been unable to retrieve documentation required in order to get spouse established with the [place] wife regarding [employer], provided her with the contact number for this provider Placed outbound call to this provider to discuss referral process and obtain fax #, spoke with Levy Sjogren with PCP [name], FNP regarding patient/wife's  request to transition to this provider, advised demographics and insurance information is needed from PCP and can be faxed to [phone], reply from PCP advised she will send this information ASAP in order to help patient get transferred to [facility] Call MD provider Informed wife of efforts being made to accommodate her request, advised per Reanna from [employer], the turn-around time is about 2-3 days after receiving PCP information at which time a scheduler will contact her to make a new patient appointment, wife verbalizes understanding and would like to proceed  Patient Goals/Self-Care Activities: with assistance from wife [name] Patient will self administer medications as prescribed Patient will attend all scheduled provider appointments Patient will call pharmacy for medication refills Patient will call provider office for new concerns or questions Patient will work with BSW to address care coordination needs and will continue to work with the clinical team to address health care and disease management related needs.    Follow Up Plan:  Telephone follow up appointment with care management team member scheduled for:  04/06/21

## 2021-03-29 DIAGNOSIS — F0150 Vascular dementia without behavioral disturbance: Secondary | ICD-10-CM

## 2021-03-29 DIAGNOSIS — I10 Essential (primary) hypertension: Secondary | ICD-10-CM

## 2021-03-31 ENCOUNTER — Telehealth: Payer: Medicare Other

## 2021-03-31 ENCOUNTER — Telehealth: Payer: Self-pay

## 2021-03-31 NOTE — Telephone Encounter (Signed)
  Care Management   Follow Up Note   03/31/2021 Name: [name] MRN: [id number] DOB: 1946/03/14   Referred by: [name], FNP Reason for referral : Chronic Care Management (Unsuccessful call)   An unsuccessful telephone outreach was attempted today. The patient was referred to the case management team for assistance with care management and care coordination. SW left a HIPAA compliant voice message requesting a return call.  Follow Up Plan: The care management team will reach out to the patient again over the next 14 days.   [name], BSW, CDP Social Worker, Certified Dementia Practitioner [place] [facility] [place] [facility] [phone]

## 2021-04-02 ENCOUNTER — Telehealth: Payer: Medicare Other

## 2021-04-02 ENCOUNTER — Ambulatory Visit (INDEPENDENT_AMBULATORY_CARE_PROVIDER_SITE_OTHER): Payer: Medicare Other

## 2021-04-02 DIAGNOSIS — F0150 Vascular dementia without behavioral disturbance: Secondary | ICD-10-CM

## 2021-04-02 DIAGNOSIS — E559 Vitamin D deficiency, unspecified: Secondary | ICD-10-CM

## 2021-04-02 DIAGNOSIS — I10 Essential (primary) hypertension: Secondary | ICD-10-CM

## 2021-04-05 NOTE — Patient Instructions (Signed)
Visit Information   PATIENT GOALS/PLAN OF CARE:  Care Plan : RN Care Manager Plan of Care  Updates made by Lynne Logan, RN since 04/02/2021 12:00 AM     Problem: No plan established for chronic disease management and Care Coordination needs for HTN, Dementia, Vitamin D deficiency   Priority: High     Long-Range Goal: Establish a plan of care for chronic disease management and Care Coordination needs forHTN, Dementia, Vitamin D deficiency   Start Date: 03/23/2021  Expected End Date: 03/23/2022  Recent Progress: On track  Priority: High  Note:   Current Barriers:  Knowledge Deficits related to plan of care for management of HTN, Dementia, Vitamin D deficiency  Chronic Disease Management support and education needs related to HTN, Dementia, Vitamin D deficiency  Cognitive Deficits Social Isolation   RNCM Clinical Goal(s):  Patient will take all medications exactly as prescribed and will call provider for medication related questions attend all scheduled medical appointments:   continue to work with RN Care Manager to address care management and care coordination needs related to  HTN, Dementia, Vitamin D deficiency  work with Education officer, museum to address  related to the management of Limited social support, Level of care concerns, ADL IADL limitations, Social Isolation, Cognitive Deficits, and Memory Deficits related to the management of    through collaboration with Consulting civil engineer, provider, and care team.   Interventions: 1:1 collaboration with primary care provider regarding development and update of comprehensive plan of care as evidenced by provider attestation and co-signature Inter-disciplinary care team collaboration (see longitudinal plan of care) Evaluation of current treatment plan related to  self management and patient's adherence to plan as established by provider  Goal on track:  Yes Evaluation of current treatment plan related to Dementia, Family and relationship  dysfunction, Social Isolation, Cognitive Deficits, and Memory Deficits self-management and patient's adherence to plan as established by provider. Discussed plans with patient for ongoing care management follow up and provided patient with direct contact information for care management team Reviewed medications with patient and discussed patient is out of his Namzaric; determined wife [name] has not been able to drive to patient's pharmacy in order to pick up Namzaric before patient ran out of medication; Determined she would like for this [facility] in order to allow her to drive a shorter distance to pick up his medication; [facility] [address], spoke with pharmacist, requested all mediations be transferred to this pharmacy, requested patient's Namzaric be refilled today; Discussed mail order protocol, patient may have debit card on file and request medications be sent by [place] Postal service for which a fee will be charged and patient will receive medications in 2-3 days, wife [name] informed of mail order options, she will have a friend drive her today to pick up Namzaric, provided wife with the contact number and [facility] ; Discussed plans with patient for ongoing care management follow up and provided patient with direct contact information for care management team; Assessed social determinant of health barriers;  Health Maintenance Interventions:  (Status:  Goal on track:  NO.)  Wife [name] interviewed about adult health maintenance status for patient including the importance of receiving adequate health care for acute illness when needed and for ongoing annually physical's as directed for best management of chronic disease states and optimal health  Determined wife continues to have difficulty leaving their home  to attend MD appointments for herself and Mr. Brutus due to lack of caregiver support  and impaired sleep pattern due to the need to care for Mr. Hayashida who continues to wander throughout the night time requiring her supervision Determined wife would like a provider who can make house calls for herself and Mr. Tiger, she has been unable to retrieve documentation required in order to get spouse established with the [place] wife regarding [employer], provided her with the contact number for this provider Placed outbound call to this provider to discuss referral process and obtain fax #, spoke with Levy Sjogren with PCP [name], FNP regarding patient/wife's request to transition to this provider, advised demographics and insurance information is needed from PCP and can be faxed to [phone], reply from PCP advised she will send this information ASAP in order to help patient get transferred to [facility] Call MD provider Informed wife of efforts being made to accommodate her request, advised per Reanna from [employer], the turn-around time is about 2-3 days after receiving PCP information at which time a scheduler will contact her to make a new patient appointment, wife verbalizes understanding and would like to proceed 04/02/21 completed inbound call with wife [name] Determined Mrs. Roggenkamp did not receive a call from [employer] for new patient appointment for herself and Mr. Anaya  Placed outbound call to Dr. Daphene Jaeger, office is closed, left a detailed voice message requesting a return call regarding recent referral for Mrs. Melodie Bouillon Mrs. [name] of voice message left for this provider and discussed this RN CM will outreach again once a call back is received to advise Mrs. Lienau of next steps Discussed plans with patient for ongoing care management follow up and provided patient with direct contact information for care management team 04/05/21 Case Collaboration  Placed outbound call to [employer], spoke with Reanna  regarding referral for services previously sent by PCP Determined this referral was not received, provided patient's demographic and insurance information as required for initiation of outreach Discussed this information will be process and the patient/wife will be contacted in 2-3 days, advised Mrs. Mcelreath is the point of contact and prefers afternoon calls  Patient Goals/Self-Care Activities: with assistance from wife [name] Patient will self administer medications as prescribed Patient will attend all scheduled provider appointments Patient will call pharmacy for medication refills Patient will call provider office for new concerns or questions Patient will work with BSW to address care coordination needs and will continue to work with the clinical team to address health care and disease management related needs.    Follow Up Plan:  Telephone follow up appointment with care management team member scheduled for:  04/06/21      Consent to CCM Services: Mr. Borum was given information about Chronic Care Management services including:  CCM service includes personalized support from designated clinical staff supervised by his physician, including individualized plan of care and coordination with other care providers 24/7 contact phone numbers for assistance for urgent and routine care needs. Service will only be billed when office clinical staff spend 20 minutes or more in a month to coordinate care. Only one practitioner may furnish and bill the service in a calendar month. The patient may stop CCM services at any time (effective at the end of the month) by phone call to the office staff. The patient will be responsible for cost sharing (co-pay) of up to 20% of the service fee (after annual  deductible is met).  Patient agreed to services and verbal consent obtained.   The patient verbalized understanding of instructions, educational materials, and care plan provided today and declined offer  to receive copy of patient instructions, educational materials, and care plan.   Telephone follow up appointment with care management team member scheduled for: 04/06/21  Barb Merino, RN, BSN, CCM Care Management Coordinator [place] [facility]  Direct Phone: [phone]

## 2021-04-05 NOTE — Chronic Care Management (AMB) (Signed)
Chronic Care Management   CCM RN Visit Note  04/02/2021 Name: [name] MRN: [id number] DOB: [birth year]  Subjective: [name] is a 75 y.o. year old male who is a primary care patient of [name], [name]. The care management team was consulted for assistance with disease management and care coordination needs.    Engaged with patient by telephone for follow up visit in response to provider referral for case management and/or care coordination services.   Consent to Services:  The patient was given information about Chronic Care Management services, agreed to services, and gave verbal consent prior to initiation of services.  Please see initial visit note for detailed documentation.   Patient agreed to services and verbal consent obtained.   Assessment: Review of patient past medical history, allergies, medications, health status, including review of consultants reports, laboratory and other test data, was performed as part of comprehensive evaluation and provision of chronic care management services.   SDOH (Social Determinants of Health) assessments and interventions performed:    CCM Care Plan  No Known Allergies  Outpatient Encounter Medications as of 04/02/2021  Medication Sig   amLODipine (NORVASC) 5 MG tablet TAKE ONE TABLET BY MOUTH ONCE DAILY   aspirin EC 81 MG tablet Take 81 mg by mouth daily.   atorvastatin (LIPITOR) 10 MG tablet Take one tablet by mouth M - F   escitalopram (LEXAPRO) 5 MG tablet TAKE ONE TABLET BY MOUTH EVERY EVENING   Memantine HCl-Donepezil HCl (NAMZARIC) 28-10 MG CP24 Take 1 capsule by mouth daily.   Vitamin D, Ergocalciferol, (DRISDOL) 1.25 MG (50000 UNIT) CAPS capsule Take 1 capsule (50,000 Units total) by mouth every 7 (seven) days.   No facility-administered encounter medications on file as of 04/02/2021.    Patient Active Problem List   Diagnosis Date Noted   Dementia ([place]) 10/19/2020   Essential hypertension 10/19/2020   Mixed  hyperlipidemia 10/19/2020   Vitamin D deficiency 10/19/2020   Depression 10/19/2020    Conditions to be addressed/monitored: HTN, Dementia, Vitamin D deficiency   Care Plan : RN Care Manager Plan of Care  Updates made by Eugene Logan, RN since 04/02/2021 12:00 AM     Problem: No plan established for chronic disease management and Care Coordination needs for HTN, Dementia, Vitamin D deficiency   Priority: High     Long-Range Goal: Establish a plan of care for chronic disease management and Care Coordination needs forHTN, Dementia, Vitamin D deficiency   Start Date: 03/23/2021  Expected End Date: 03/23/2022  Recent Progress: On track  Priority: High  Note:   Current Barriers:  Knowledge Deficits related to plan of care for management of HTN, Dementia, Vitamin D deficiency  Chronic Disease Management support and education needs related to HTN, Dementia, Vitamin D deficiency  Cognitive Deficits Social Isolation   RNCM Clinical Goal(s):  Patient will take all medications exactly as prescribed and will call provider for medication related questions attend all scheduled medical appointments:   continue to work with RN Care Manager to address care management and care coordination needs related to  HTN, Dementia, Vitamin D deficiency  work with Education officer, museum to address  related to the management of Limited social support, Level of care concerns, ADL IADL limitations, Social Isolation, Cognitive Deficits, and Memory Deficits related to the management of    through collaboration with Consulting civil engineer, provider, and care team.   Interventions: 1:1 collaboration with primary care provider regarding development and update of comprehensive plan  of care as evidenced by provider attestation and co-signature Inter-disciplinary care team collaboration (see longitudinal plan of care) Evaluation of current treatment plan related to  self management and patient's adherence to plan as established by  provider  Goal on track:  Yes Evaluation of current treatment plan related to Dementia, Family and relationship dysfunction, Social Isolation, Cognitive Deficits, and Memory Deficits self-management and patient's adherence to plan as established by provider. Discussed plans with patient for ongoing care management follow up and provided patient with direct contact information for care management team Reviewed medications with patient and discussed patient is out of his Namzaric; determined wife [name] has not been able to drive to patient's pharmacy in order to pick up Namzaric before patient ran out of medication; Determined she would like for [facility] in [facility] to allow her to drive a shorter distance to pick up his medication; [facility] [address], spoke with pharmacist, requested all mediations be transferred to this pharmacy, requested patient's Namzaric be refilled today; Discussed mail order protocol, patient may have debit card on file and request medications be sent by [place] Postal service for which a fee will be charged and patient will receive medications in 2-3 days, wife [name] informed of mail order options, she will have a friend drive her today to pick up Namzaric, provided wife with the contact number and [facility] ; Discussed plans with patient for ongoing care management follow up and provided patient with direct contact information for care management team; Assessed social determinant of health barriers;  Health Maintenance Interventions:  (Status:  Goal on track:  NO.)  Wife [name] interviewed about adult health maintenance status for patient including the importance of receiving adequate health care for acute illness when needed and for ongoing annually physical's as directed for best management of chronic disease states and optimal health  Determined wife continues to have  difficulty leaving their home to attend MD appointments for herself and [name] due to lack of caregiver support and impaired sleep pattern due to the need to care for Mr. Hiney who continues to wander throughout the night time requiring her supervision Determined wife would like a provider who can make house calls for herself and Mr. Lichtman, she has been unable to retrieve documentation required in order to get spouse established with the [place] wife regarding [employer], provided her with the contact number for this provider Placed outbound call to this provider to discuss referral process and obtain fax #, spoke with Levy Sjogren with PCP Eugene Brine, FNP regarding patient/wife's request to transition to this provider, advised demographics and insurance information is needed from PCP and can be faxed to [phone], reply from PCP advised she will send this information ASAP in order to help patient get transferred to [facility] Call MD provider Informed wife of efforts being made to accommodate her request, advised per Reanna from [employer], the turn-around time is about 2-3 days after receiving PCP information at which time a scheduler will contact her to make a new patient appointment, wife verbalizes understanding and would like to proceed 04/02/21 completed inbound call with wife [name] Determined Mrs. Heap did not receive a call from [employer] for new patient appointment for herself and Mr. Dantonio  Placed outbound call to Dr. Daphene Jaeger, office is closed, left a detailed voice message requesting a return call regarding recent  referral for Mrs. Melodie Bouillon Mrs. [name] of voice message left for this provider and discussed this RN CM will outreach again once a call back is received to advise Mrs. Ruan of next steps Discussed plans with patient for ongoing care management follow up and provided patient with direct contact information for care  management team 04/05/21 Case Collaboration  Placed outbound call to [employer], spoke with Reanna regarding referral for services previously sent by PCP Determined this referral was not received, provided patient's demographic and insurance information as required for initiation of outreach Discussed this information will be process and the patient/wife will be contacted in 2-3 days, advised Mrs. Kearley is the point of contact and prefers afternoon calls  Patient Goals/Self-Care Activities: with assistance from wife [name] Patient will self administer medications as prescribed Patient will attend all scheduled provider appointments Patient will call pharmacy for medication refills Patient will call provider office for new concerns or questions Patient will work with BSW to address care coordination needs and will continue to work with the clinical team to address health care and disease management related needs.    Follow Up Plan:  Telephone follow up appointment with care management team member scheduled for:  04/06/21     Plan:Telephone follow up appointment with care management team member scheduled for:  04/06/21  Barb Merino, RN, BSN, CCM Care Management Coordinator [place] [facility]  Direct Phone: [phone]

## 2021-04-06 ENCOUNTER — Telehealth: Payer: Medicare Other

## 2021-04-06 ENCOUNTER — Telehealth: Payer: Self-pay

## 2021-04-06 NOTE — Telephone Encounter (Signed)
  Care Management   Follow Up Note   04/06/2021 Name: [name] MRN: [id number] DOB: May 12, 1946   Referred by: [name], FNP Reason for referral : Chronic Care Management (RN CM Follow up call )   An unsuccessful telephone outreach was attempted today. The patient was referred to the case management team for assistance with care management and care coordination.   Follow Up Plan: A HIPPA compliant phone message was left for the patient providing contact information and requesting a return call.   Barb Merino, RN, BSN, CCM Care Management Coordinator [place] [facility]  Direct Phone: [phone]

## 2021-04-08 ENCOUNTER — Telehealth: Payer: Medicare Other

## 2021-04-08 ENCOUNTER — Ambulatory Visit: Payer: Self-pay

## 2021-04-08 DIAGNOSIS — E559 Vitamin D deficiency, unspecified: Secondary | ICD-10-CM

## 2021-04-08 DIAGNOSIS — F0150 Vascular dementia without behavioral disturbance: Secondary | ICD-10-CM

## 2021-04-08 DIAGNOSIS — I10 Essential (primary) hypertension: Secondary | ICD-10-CM

## 2021-04-09 NOTE — Chronic Care Management (AMB) (Signed)
Chronic Care Management   CCM RN Visit Note  04/08/2021 Name: THAO VANOVER MRN: [id number] DOB: 02/07/1946  Subjective: [name] is a 75 y.o. year old male who is a primary care patient of [name], [place]. The care management team was consulted for assistance with disease management and care coordination needs.    Engaged with patient by telephone for follow up visit in response to provider referral for case management and/or care coordination services.   Consent to Services:  The patient was given information about Chronic Care Management services, agreed to services, and gave verbal consent prior to initiation of services.  Please see initial visit note for detailed documentation.   Patient agreed to services and verbal consent obtained.   Assessment: Review of patient past medical history, allergies, medications, health status, including review of consultants reports, laboratory and other test data, was performed as part of comprehensive evaluation and provision of chronic care management services.   SDOH (Social Determinants of Health) assessments and interventions performed:    CCM Care Plan  No Known Allergies  Outpatient Encounter Medications as of 04/08/2021  Medication Sig   amLODipine (NORVASC) 5 MG tablet TAKE ONE TABLET BY MOUTH ONCE DAILY   aspirin EC 81 MG tablet Take 81 mg by mouth daily.   atorvastatin (LIPITOR) 10 MG tablet Take one tablet by mouth M - F   escitalopram (LEXAPRO) 5 MG tablet TAKE ONE TABLET BY MOUTH EVERY EVENING   Memantine HCl-Donepezil HCl (NAMZARIC) 28-10 MG CP24 Take 1 capsule by mouth daily.   Vitamin D, Ergocalciferol, (DRISDOL) 1.25 MG (50000 UNIT) CAPS capsule Take 1 capsule (50,000 Units total) by mouth every 7 (seven) days.   No facility-administered encounter medications on file as of 04/08/2021.    Patient Active Problem List   Diagnosis Date Noted   Dementia ([place]) 10/19/2020   Essential hypertension 10/19/2020   Mixed  hyperlipidemia 10/19/2020   Vitamin D deficiency 10/19/2020   Depression 10/19/2020    Conditions to be addressed/monitored: HTN, Dementia, Vitamin D deficiency  Care Plan : RN Care Manager Plan of Care  Updates made by Lynne Logan, RN since 04/08/2021 12:00 AM     Problem: No plan established for chronic disease management and Care Coordination needs for HTN, Dementia, Vitamin D deficiency   Priority: High     Long-Range Goal: Establish a plan of care for chronic disease management and Care Coordination needs forHTN, Dementia, Vitamin D deficiency   Start Date: 03/23/2021  Expected End Date: 03/23/2022  Recent Progress: On track  Priority: High  Note:   Current Barriers:  Knowledge Deficits related to plan of care for management of HTN, Dementia, Vitamin D deficiency  Chronic Disease Management support and education needs related to HTN, Dementia, Vitamin D deficiency  Cognitive Deficits Social Isolation   RNCM Clinical Goal(s):  Patient will take all medications exactly as prescribed and will call provider for medication related questions attend all scheduled medical appointments:   continue to work with RN Care Manager to address care management and care coordination needs related to  HTN, Dementia, Vitamin D deficiency  work with Education officer, museum to address  related to the management of Limited social support, Level of care concerns, ADL IADL limitations, Social Isolation, Cognitive Deficits, and Memory Deficits related to the management of    through collaboration with Consulting civil engineer, provider, and care team.   Interventions: 1:1 collaboration with primary care provider regarding development and update of comprehensive plan of  care as evidenced by provider attestation and co-signature Inter-disciplinary care team collaboration (see longitudinal plan of care) Evaluation of current treatment plan related to  self management and patient's adherence to plan as established by  provider  Condition stable.  Not addressed this visit. Evaluation of current treatment plan related to Dementia, Family and relationship dysfunction, Social Isolation, Cognitive Deficits, and Memory Deficits self-management and patient's adherence to plan as established by provider. Discussed plans with patient for ongoing care management follow up and provided patient with direct contact information for care management team Reviewed medications with patient and discussed patient is out of his Namzaric; determined wife [name] has not been able to drive to patient's pharmacy in order to pick up Namzaric before patient ran out of medication; Determined she would like for this [facility] in order to allow her to drive a shorter distance to pick up his medication; [facility] [address], spoke with pharmacist, requested all mediations be transferred to this pharmacy, requested patient's Namzaric be refilled today; Discussed mail order protocol, patient may have debit card on file and request medications be sent by [place] Postal service for which a fee will be charged and patient will receive medications in 2-3 days, wife [name] informed of mail order options, she will have a friend drive her today to pick up Namzaric, provided wife with the contact number and [facility] ; Discussed plans with patient for ongoing care management follow up and provided patient with direct contact information for care management team; Assessed social determinant of health barriers;  Health Maintenance Interventions:  (Status:  Goal on track:  Yes)  Wife [name] interviewed about adult health maintenance status for patient including the importance of receiving adequate health care for acute illness when needed and for ongoing annually physical's as directed for best management of chronic disease states and optimal health  Completed  inbound call with wife [name] to discuss patient will have his initial visit with Dr. Daphene Jaeger on 04/09/21 @ 4 PM  Discussed wife prefers to complete this visit to ensure the patient will make this provider his PCP for ongoing medical management and she will let this RN CM know the outcome Sent secure message to PCP and embedded BSW with a patient status update concerning establishment with [facility] Discussed plans with patient for ongoing care management follow up and provided patient with direct contact information for care management team  Patient Goals/Self-Care Activities: with assistance from wife [name] Patient will self administer medications as prescribed Patient will attend all scheduled provider appointments Patient will call pharmacy for medication refills Patient will call provider office for new concerns or questions Patient will work with BSW to address care coordination needs and will continue to work with the clinical team to address health care and disease management related needs.    Follow Up Plan:  Telephone follow up appointment with care management team member scheduled for:  04/27/21     Plan:Telephone follow up appointment with care management team member scheduled for:  04/08/21  Barb Merino, RN, BSN, CCM Care Management Coordinator [place] [facility]  Direct Phone: [phone]

## 2021-04-09 NOTE — Patient Instructions (Signed)
Visit Information   PATIENT GOALS/PLAN OF CARE:  Care Plan : RN Care Manager Plan of Care  Updates made by Lynne Logan, RN since 04/08/2021 12:00 AM     Problem: No plan established for chronic disease management and Care Coordination needs for HTN, Dementia, Vitamin D deficiency   Priority: High     Long-Range Goal: Establish a plan of care for chronic disease management and Care Coordination needs forHTN, Dementia, Vitamin D deficiency   Start Date: 03/23/2021  Expected End Date: 03/23/2022  Recent Progress: On track  Priority: High  Note:   Current Barriers:  Knowledge Deficits related to plan of care for management of HTN, Dementia, Vitamin D deficiency  Chronic Disease Management support and education needs related to HTN, Dementia, Vitamin D deficiency  Cognitive Deficits Social Isolation   RNCM Clinical Goal(s):  Patient will take all medications exactly as prescribed and will call provider for medication related questions attend all scheduled medical appointments:   continue to work with RN Care Manager to address care management and care coordination needs related to  HTN, Dementia, Vitamin D deficiency  work with Education officer, museum to address  related to the management of Limited social support, Level of care concerns, ADL IADL limitations, Social Isolation, Cognitive Deficits, and Memory Deficits related to the management of    through collaboration with Consulting civil engineer, provider, and care team.   Interventions: 1:1 collaboration with primary care provider regarding development and update of comprehensive plan of care as evidenced by provider attestation and co-signature Inter-disciplinary care team collaboration (see longitudinal plan of care) Evaluation of current treatment plan related to  self management and patient's adherence to plan as established by provider  Condition stable.  Not addressed this visit. Evaluation of current treatment plan related to Dementia,  Family and relationship dysfunction, Social Isolation, Cognitive Deficits, and Memory Deficits self-management and patient's adherence to plan as established by provider. Discussed plans with patient for ongoing care management follow up and provided patient with direct contact information for care management team Reviewed medications with patient and discussed patient is out of his Namzaric; determined wife [name] has not been able to drive to patient's pharmacy in order to pick up Namzaric before patient ran out of medication; Determined she would like for this [facility] in order to allow her to drive a shorter distance to pick up his medication; [facility] [address], spoke with pharmacist, requested all mediations be transferred to this pharmacy, requested patient's Namzaric be refilled today; Discussed mail order protocol, patient may have debit card on file and request medications be sent by [place] Postal service for which a fee will be charged and patient will receive medications in 2-3 days, wife [name] informed of mail order options, she will have a friend drive her today to pick up Namzaric, provided wife with the contact number and [facility] ; Discussed plans with patient for ongoing care management follow up and provided patient with direct contact information for care management team; Assessed social determinant of health barriers;  Health Maintenance Interventions:  (Status:  Goal on track:  Yes)  Wife [name] interviewed about adult health maintenance status for patient including the importance of receiving adequate health care for acute illness when needed and for ongoing annually physical's as directed for best management of chronic disease states and optimal health  Completed inbound call with wife [name] to  discuss patient will have his initial visit with Dr. Daphene Jaeger on 04/09/21 @ 4 PM   Discussed wife prefers to complete this visit to ensure the patient will make this provider his PCP for ongoing medical management and she will let this RN CM know the outcome Sent secure message to PCP and embedded BSW with a patient status update concerning establishment with [facility] Discussed plans with patient for ongoing care management follow up and provided patient with direct contact information for care management team  Patient Goals/Self-Care Activities: with assistance from wife [name] Patient will self administer medications as prescribed Patient will attend all scheduled provider appointments Patient will call pharmacy for medication refills Patient will call provider office for new concerns or questions Patient will work with BSW to address care coordination needs and will continue to work with the clinical team to address health care and disease management related needs.    Follow Up Plan:  Telephone follow up appointment with care management team member scheduled for:  04/27/21      Consent to CCM Services: Mr. Harju was given information about Chronic Care Management services including:  CCM service includes personalized support from designated clinical staff supervised by his physician, including individualized plan of care and coordination with other care providers 24/7 contact phone numbers for assistance for urgent and routine care needs. Service will only be billed when office clinical staff spend 20 minutes or more in a month to coordinate care. Only one practitioner may furnish and bill the service in a calendar month. The patient may stop CCM services at any time (effective at the end of the month) by phone call to the office staff. The patient will be responsible for cost sharing (co-pay) of up to 20% of the service fee (after annual deductible is met).  Patient agreed to services and verbal consent obtained.   The patient verbalized  understanding of instructions, educational materials, and care plan provided today and declined offer to receive copy of patient instructions, educational materials, and care plan.   Telephone follow up appointment with care management team member scheduled for: 04/27/21  Barb Merino, RN, BSN, CCM Care Management Coordinator [place] [facility]  Direct Phone: [phone]

## 2021-04-12 DIAGNOSIS — Z0000 Encounter for general adult medical examination without abnormal findings: Secondary | ICD-10-CM | POA: Diagnosis not present

## 2021-04-12 DIAGNOSIS — I10 Essential (primary) hypertension: Secondary | ICD-10-CM | POA: Diagnosis not present

## 2021-04-12 DIAGNOSIS — F32A Depression, unspecified: Secondary | ICD-10-CM | POA: Diagnosis not present

## 2021-04-12 DIAGNOSIS — E785 Hyperlipidemia, unspecified: Secondary | ICD-10-CM | POA: Diagnosis not present

## 2021-04-13 ENCOUNTER — Ambulatory Visit: Payer: Medicare Other

## 2021-04-13 DIAGNOSIS — I10 Essential (primary) hypertension: Secondary | ICD-10-CM

## 2021-04-13 DIAGNOSIS — F0150 Vascular dementia without behavioral disturbance: Secondary | ICD-10-CM

## 2021-04-13 NOTE — Patient Instructions (Signed)
Social Worker Visit Information  Goals we discussed today:    The patient verbalized understanding of instructions, educational materials, and care plan provided today and declined offer to receive copy of patient instructions, educational materials, and care plan.   Follow Up Plan:  No follow up planned- patient to contact Dr. Daphene Jaeger for all patient care needs.   [name], BSW, CDP Social Worker, Certified Dementia Practitioner [place] [facility] [facility] [phone]

## 2021-04-13 NOTE — Chronic Care Management (AMB) (Signed)
Chronic Care Management    Social Work Note  04/13/2021 Name: [name] MRN: [id number] DOB: 01-04-1946  [name] is a 75 y.o. year old male who is a primary care patient of [name], [place]. The CCM team was consulted to assist the patient with chronic disease management and/or care coordination needs related to:  HTN and Dementia .   Engaged with patients spouse [name] by phone  for follow up visit in response to provider referral for social work chronic care management and care coordination services. Determined the patient has enrolled with Dr. Daphene Jaeger for in home primary care as of 04/09/21. Discussed plans for SW to perform a case closure as patient is no longer eligible for care management program. Collaboration with [name] FNP to advise of case closure.  Consent to Services:  The patient was given information about Chronic Care Management services, agreed to services, and gave verbal consent prior to initiation of services.  Please see initial visit note for detailed documentation.   Patient agreed to services and consent obtained.   Assessment: Review of patient past medical history, allergies, medications, and health status, including review of relevant consultants reports was performed today as part of a comprehensive evaluation and provision of chronic care management and care coordination services.     SDOH (Social Determinants of Health) assessments and interventions performed:    Advanced Directives Status: Not addressed in this encounter.  CCM Care Plan  No Known Allergies  Outpatient Encounter Medications as of 04/13/2021  Medication Sig   amLODipine (NORVASC) 5 MG tablet TAKE ONE TABLET BY MOUTH ONCE DAILY   aspirin EC 81 MG tablet Take 81 mg by mouth daily.   atorvastatin (LIPITOR) 10 MG tablet Take one tablet by mouth M - F   escitalopram (LEXAPRO) 5 MG tablet TAKE ONE TABLET BY MOUTH EVERY EVENING   Memantine HCl-Donepezil HCl (NAMZARIC)  28-10 MG CP24 Take 1 capsule by mouth daily.   Vitamin D, Ergocalciferol, (DRISDOL) 1.25 MG (50000 UNIT) CAPS capsule Take 1 capsule (50,000 Units total) by mouth every 7 (seven) days.   No facility-administered encounter medications on file as of 04/13/2021.    Patient Active Problem List   Diagnosis Date Noted   Dementia ([place]) 10/19/2020   Essential hypertension 10/19/2020   Mixed hyperlipidemia 10/19/2020   Vitamin D deficiency 10/19/2020   Depression 10/19/2020    Conditions to be addressed/monitored: HTN and Dementia  Care Plan : [name]  Updates made by [name] since 04/13/2021 12:00 AM  Completed 04/13/2021   Problem: HTN, HLD, Vitamin D Deficiency, Tobacc Use Resolved 04/13/2021  Priority: High     Long-Range Goal: Disease Management Completed 04/13/2021  Start Date: 08/13/2020  Recent Progress: On track  Priority: High  Note:     Current Barriers:  Unable to self administer medications as prescribed Does not adhere to prescribed medication regimen  Pharmacist Clinical Goal(s):  Patient will achieve adherence to monitoring guidelines and medication adherence to achieve therapeutic efficacy through collaboration with PharmD and provider.    Interventions: 1:1 collaboration with [name], FNP regarding development and update of comprehensive plan of care as evidenced by provider attestation and co-signature Inter-disciplinary care team collaboration (see longitudinal plan of care) Comprehensive medication review performed; medication list updated in electronic medical record  Hypertension (BP goal <140/90) -Controlled -Current treatment: Amlodipine 5 mg tablet once daily  -Current home readings: patient reports that he is not currently checking his BP  -  Current dietary habits: patient reports that he believes he eats well, eggs, bacon and toast, he reports that he might eat too much salt.  -Current exercise habits: patient reports he  walks once or twice per week for about 10 minutes. He would like to walk more maybe about 10 minutes a day.  -Reports hypotensive/hypertensive symptoms -Educated on Daily salt intake goal < 2300 mg; -Counseled to monitor BP at home at least once per week, document, and provide log at future appointments -Counseled on diet and exercise extensively Recommended to continue current medication  Hyperlipidemia: (LDL goal <70) -Uncontrolled -Current treatment: ASA 81 mg tablet daily  Pravastatin 20 mg tablet take daily Patient reports not being adherent   -Current dietary patterns: patient reports avoiding most fried and fatty foods.  -Current exercise habits: patient is currently working out for 10 minutes two days per week  -Educated on Cholesterol goals;  Benefits of statin for ASCVD risk reduction; Importance of limiting foods high in cholesterol; Strategies to manage statin-induced myalgias; -Recommended to continue current medication Counseled on the importance of taking his medication everyday.  Collaborated with PCP to change patients current cholesterol medication to increase adherence.  Will recommend that patients medication be changed to atorvastatin 10 mg tablet once per week to increase patients adherence to medication regimen.    Vitamin D Deficiency -Uncontrolled -Current treatment  Vitamin D take 1 capsule by mouth once per week  -Patient reports that he has not been taking his medication once per week.  -Recommended to continue current medication   Tobacco use (Goal Smoking Cessation) -Uncontrolled -Previous quit attempts: will discuss further during next visit.  -Current treatment  None at this time -Will discuss further during next office visit. -Provided contact information for [place] Quit Line (1-800-QUIT-NOW) and encouraged patient to reach out to this group for support.   Health Maintenance -Vaccine gaps: Patient is a candidate for: COVID-19 Booster, Zostavax,  Prevnar and Pneumovax   Will discuss further with patient at next office visit in April  -Educated on the importance of being care with herbal remedies  -Will discuss the importance of vaccination when patient is seen in April.    Patient Goals/Self-Care Activities Patient will:  - take medications as prescribed focus on medication adherence by using medication packaging.   Follow Up Plan: Telephone follow up appointment with care management team member scheduled for:  09/17/2020    Care Plan : RN Care Manager Plan of Care  Updates made by [name] since 04/13/2021 12:00 AM  Completed 04/13/2021   Problem: No plan established for chronic disease management and Care Coordination needs for HTN, Dementia, Vitamin D deficiency Resolved 04/13/2021  Priority: High     Long-Range Goal: Establish a plan of care for chronic disease management and Care Coordination needs forHTN, Dementia, Vitamin D deficiency Completed 04/13/2021  Start Date: 03/23/2021  Expected End Date: 03/23/2022  Recent Progress: On track  Priority: High  Note:   Current Barriers:  Knowledge Deficits related to plan of care for management of HTN, Dementia, Vitamin D deficiency  Chronic Disease Management support and education needs related to HTN, Dementia, Vitamin D deficiency  Cognitive Deficits Social Isolation   RNCM Clinical Goal(s):  Patient will take all medications exactly as prescribed and will call provider for medication related questions attend all scheduled medical appointments:   continue to work with RN Care Manager to address care management and care coordination needs related to  HTN, Dementia, Vitamin D deficiency  work  with social worker to address  related to the management of Limited social support, Level of care concerns, ADL IADL limitations, Social Isolation, Cognitive Deficits, and Memory Deficits related to the management of    through collaboration with Consulting civil engineer, provider, and  care team.    SW Interventions:  Inter-disciplinary care team collaboration (see longitudinal plan of care) Collaboration with [name], [place] regarding development and update of comprehensive plan of care as evidenced by provider attestation and co-signature Successful outbound call placed to the patients spouse [name] who confirms the patient did participate in initial visit with Dr. Daphene Jaeger on Friday 11/11  Discussed patient will enroll with Dr. Etter Sjogren as primary care provider with planned monthly in home visits Canceled all future appointments with providers at [facility] per Mrs. Kubitz's request Collaboration with [name] FNP and Care Management team to advise of case closure  Interventions: 1:1 collaboration with primary care provider regarding development and update of comprehensive plan of care as evidenced by provider attestation and co-signature Inter-disciplinary care team collaboration (see longitudinal plan of care) Evaluation of current treatment plan related to  self management and patient's adherence to plan as established by provider  Condition stable.  Not addressed this visit. Evaluation of current treatment plan related to Dementia, Family and relationship dysfunction, Social Isolation, Cognitive Deficits, and Memory Deficits self-management and patient's adherence to plan as established by provider. Discussed plans with patient for ongoing care management follow up and provided patient with direct contact information for care management team Reviewed medications with patient and discussed patient is out of his Namzaric; determined wife [name] has not been able to drive to patient's pharmacy in order to pick up Namzaric before patient ran out of medication; Determined she would like for this [facility] to be [facility] in order to allow her to drive a shorter distance to pick up his medication; [facility] [address], spoke with pharmacist, requested all mediations be transferred to this pharmacy, requested patient's Namzaric be refilled today; Discussed mail order protocol, patient may have debit card on file and request medications be sent by [place] Postal service for which a fee will be charged and patient will receive medications in 2-3 days, wife [name] informed of mail order options, she will have a friend drive her today to pick up Namzaric, provided wife with the contact number and [facility] ; Discussed plans with patient for ongoing care management follow up and provided patient with direct contact information for care management team; Assessed social determinant of health barriers;  Health Maintenance Interventions:  (Status:  Goal on track:  Yes)  Wife [name] interviewed about adult health maintenance status for patient including the importance of receiving adequate health care for acute illness when needed and for ongoing annually physical's as directed for best management of chronic disease states and optimal health  Completed inbound call with wife [name] to discuss patient will have his initial visit with Dr. Daphene Jaeger on 04/09/21 @ 4 PM  Discussed wife prefers to complete this visit to ensure the patient will make this provider his PCP for ongoing medical management and she will let this RN CM know the outcome Sent secure message to PCP and embedded BSW with a patient status update concerning establishment with [facility] Discussed plans with patient for ongoing care management follow up and provided patient with direct contact information for care management team  Patient Goals/Self-Care Activities: with assistance from wife [name] Patient will self administer medications as prescribed Patient will attend all scheduled provider appointments Patient will call pharmacy for medication refills Patient will call provider office for new  concerns or questions Patient will work with BSW to address care coordination needs and will continue to work with the clinical team to address health care and disease management related needs.    Follow Up Plan:  No planned follow up at this time as patient has chosen to switch primary care providers       Follow Up Plan:  No follow up planned at this time. SW to perform a case closure as patient has chosen to switch to an in home primary care provider.      [name], BSW, CDP Social Worker, Certified Dementia Practitioner [place] [facility] [place] [facility] [phone]

## 2021-04-14 ENCOUNTER — Ambulatory Visit: Payer: Medicare Other | Admitting: Nurse Practitioner

## 2021-04-14 ENCOUNTER — Ambulatory Visit: Payer: Medicare Other

## 2021-04-16 ENCOUNTER — Telehealth: Payer: Medicare Other

## 2021-04-19 ENCOUNTER — Telehealth: Payer: Self-pay | Admitting: Nurse Practitioner

## 2021-04-19 NOTE — Telephone Encounter (Signed)
Left message for patient to call back and schedule Medicare Annual Wellness Visit (AWV) either virtually or in office.  Left both my jabber number [phone] and office number    Last AWV 04/08/20  please schedule at anytime with [facility]    This should be a 45 minute visit.

## 2021-04-19 NOTE — Telephone Encounter (Signed)
Left message for patient to call back and schedule Medicare Annual Wellness Visit (AWV) either virtually or in office.  Left both my jabber number [phone] and office number    Last AWV ;04/08/20 please schedule at anytime with [facility]    This should be a 45 minute visit.

## 2021-04-27 ENCOUNTER — Telehealth: Payer: Medicare Other

## 2021-04-28 DIAGNOSIS — F0150 Vascular dementia without behavioral disturbance: Secondary | ICD-10-CM

## 2021-05-05 ENCOUNTER — Telehealth: Payer: Self-pay

## 2021-05-05 NOTE — Telephone Encounter (Signed)
This nurse attempted to call patient three times for telephonic AWV. Message left that we will call to reschedule for another time. 

## 2021-05-10 ENCOUNTER — Telehealth: Payer: Self-pay | Admitting: Nurse Practitioner

## 2021-05-10 NOTE — Telephone Encounter (Signed)
Left message for patient to call back and schedule Medicare Annual Wellness Visit (AWV) either virtually or in office.  Left both my jabber number [phone] and office number    Last AWV 04/08/20  please schedule at anytime with [facility]    This should be a 45 minute visit.

## 2021-06-18 ENCOUNTER — Telehealth: Payer: Self-pay

## 2021-06-18 NOTE — Chronic Care Management (AMB) (Addendum)
° ° °  Chronic Care Management Pharmacy Assistant   Name: [name]  MRN: [id number] DOB: 09/09/1945  Reason for Encounter: Medication Review/ Medication coordination  Recent office visits:  04-13-2021 [name] (CCM)  Recent consult visits:  05-12-2021 Clovia Cuff, MD (Internal medicine). Unable to view encounter.  04-12-2021 Peace Dzigbordi Dormon, MD (Internal medicine). Unable to view encounter.  04-08-2021 Little, Glenard Haring, RN (CCM)  04-02-2021  Barb Merino, RN (CCM)  03-23-2021  Little, Glenard Haring, RN (CCM)  Hospital visits:  None in previous 6 months  Medications: Outpatient Encounter Medications as of 06/18/2021  Medication Sig   amLODipine (NORVASC) 5 MG tablet TAKE ONE TABLET BY MOUTH ONCE DAILY   aspirin EC 81 MG tablet Take 81 mg by mouth daily.   atorvastatin (LIPITOR) 10 MG tablet Take one tablet by mouth M - F   escitalopram (LEXAPRO) 5 MG tablet TAKE ONE TABLET BY MOUTH EVERY EVENING   Memantine HCl-Donepezil HCl (NAMZARIC) 28-10 MG CP24 Take 1 capsule by mouth daily.   Vitamin D, Ergocalciferol, (DRISDOL) 1.25 MG (50000 UNIT) CAPS capsule Take 1 capsule (50,000 Units total) by mouth every 7 (seven) days.   No facility-administered encounter medications on file as of 06/18/2021.   Reviewed chart for medication changes ahead of medication coordination call.  No hospital visits since last care coordination call/Pharmacist visit.   No medication changes indicated OR if recent visit, treatment plan here.  BP Readings from Last 3 Encounters:  08/04/20 130/72  04/08/20 (!) 142/84  04/08/20 (!) 142/84    Lab Results  Component Value Date   HGBA1C 5.5 04/08/2020     Patient obtains medications through Adherence Packaging  90 Days   Last adherence delivery included:  Atorvastatin 10 mg one tablet with evening meals M-F Amlodipine 5 mg one tablet with breakfast Lexapro 5 mg one tablet with evening meals Vitamin D2 1250 mcg on sundays  Patient  declined (meds) last month: None  Patient is due for next adherence delivery on: 06-30-2020   Called patient and reviewed medications and coordinated delivery.  This delivery to include: Atorvastatin 10 mg one tablet with evening meals M-F Amlodipine 5 mg one tablet with breakfast Lexapro 5 mg one tablet with evening meals Vitamin D2 1250 mcg on sundays  No short/acute fill needed  Patient's wife declined the following medications: None  Patient needs refills for: Request sent to Dr. Daphene Jaeger Vitamin D2 Atorvastatin  Confirmed delivery date of 06-30-2021 advised patient's wife that pharmacy will contact them the morning of delivery.  NOTES: Patient's wife confirmed that patient has a new PCP and [facility].  Care Gaps: PNA Vac overdue Shingrix overdue Covid booster overdue Flu vaccine overdue AWV 05-05-2021 No show  Star Rating Drugs: Atorvastatin 10 mg- Last filled 03-25-2021 [facility]  [place] Clinical Pharmacist Assistant [phone]

## 2021-06-24 ENCOUNTER — Other Ambulatory Visit: Payer: Self-pay | Admitting: Nurse Practitioner

## 2021-06-24 DIAGNOSIS — E559 Vitamin D deficiency, unspecified: Secondary | ICD-10-CM

## 2021-06-24 DIAGNOSIS — E782 Mixed hyperlipidemia: Secondary | ICD-10-CM

## 2021-07-09 DIAGNOSIS — I10 Essential (primary) hypertension: Secondary | ICD-10-CM | POA: Diagnosis not present

## 2021-07-09 DIAGNOSIS — F32A Depression, unspecified: Secondary | ICD-10-CM | POA: Diagnosis not present

## 2021-07-09 DIAGNOSIS — Z720 Tobacco use: Secondary | ICD-10-CM | POA: Diagnosis not present

## 2021-07-20 IMAGING — CT CT CHEST LUNG CANCER SCREENING LOW DOSE W/O CM
1 series · 15 of 32 positions shown, 19 images · non-contrast
Comparison: None.

CLINICAL DATA: 74-year-old male current smoker, with 33 pack-year
history of smoking, for initial lung cancer screening

EXAM:
CT CHEST WITHOUT CONTRAST LOW-DOSE FOR LUNG CANCER SCREENING
TECHNIQUE: Multidetector CT imaging of the chest was performed following the
standard protocol without IV contrast.

[Series 2: ldct screening <30 bmi · axial · 0.70mm/px · z∈[-341,-6]mm · 15 of 76 slices shown, 19 images]
[im 6/76  mediastinal]
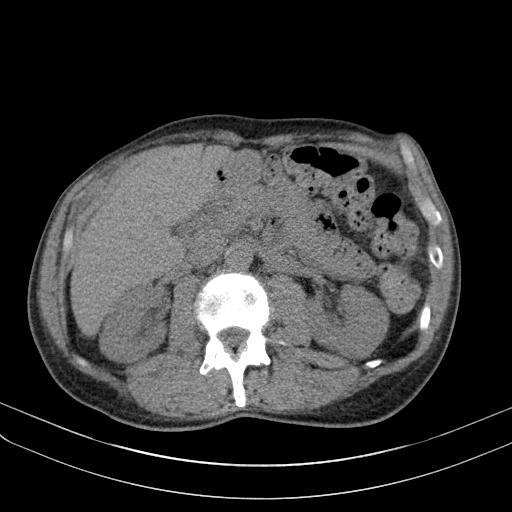
[im 6/76  lung]
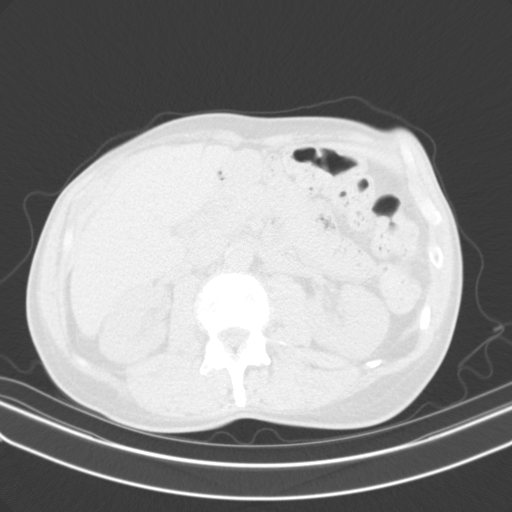
[im 12/76  lung]
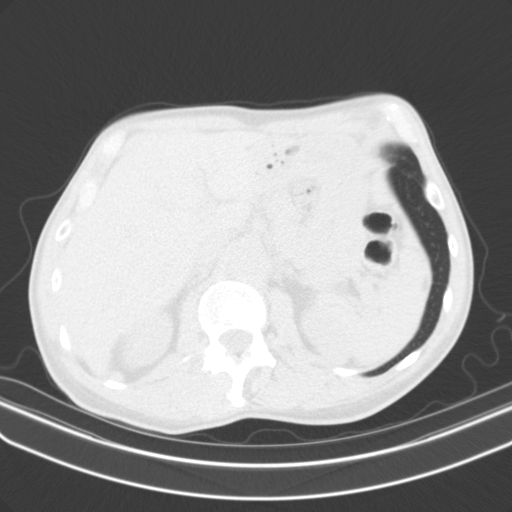
[im 16/76  lung]
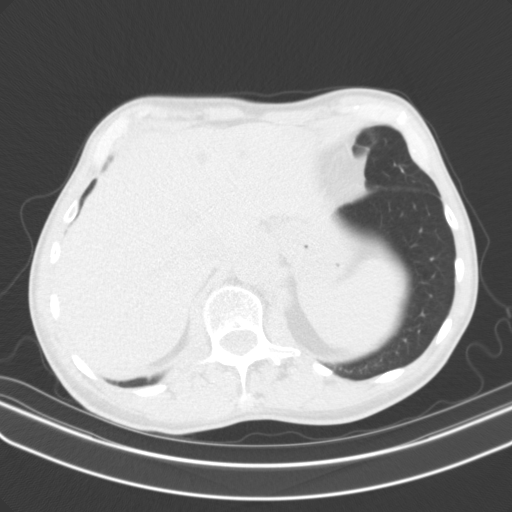
[im 20/76  lung]
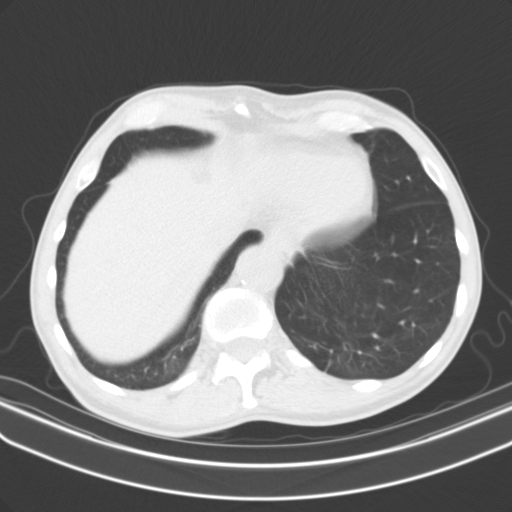
[im 26/76  mediastinal]
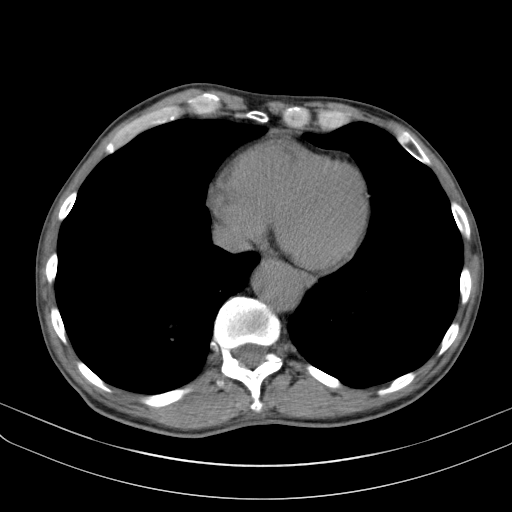
[im 26/76  lung]
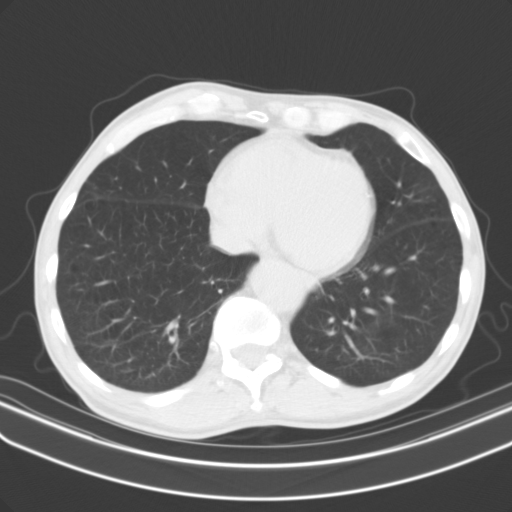
[im 31/76  lung]
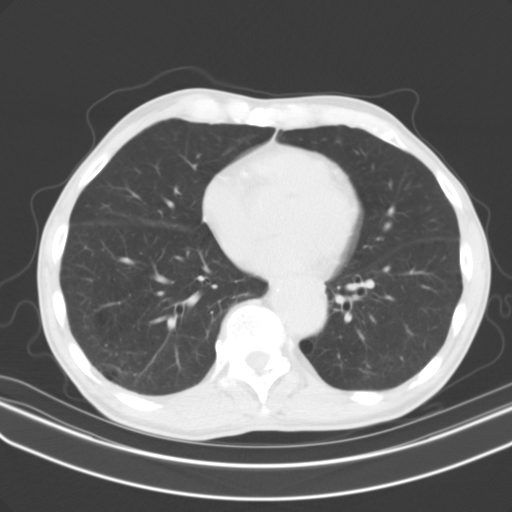
[im 37/76  lung]
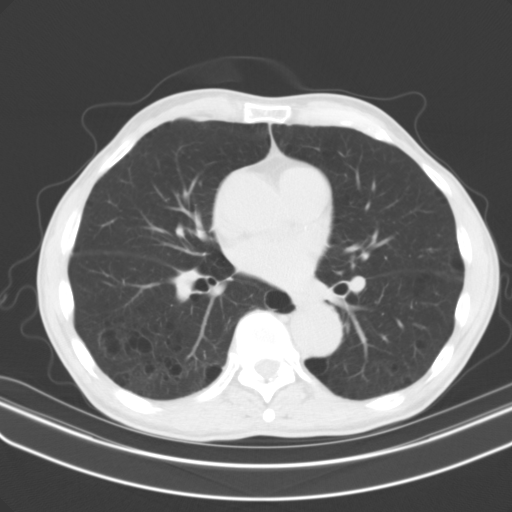
[im 40/76  lung]
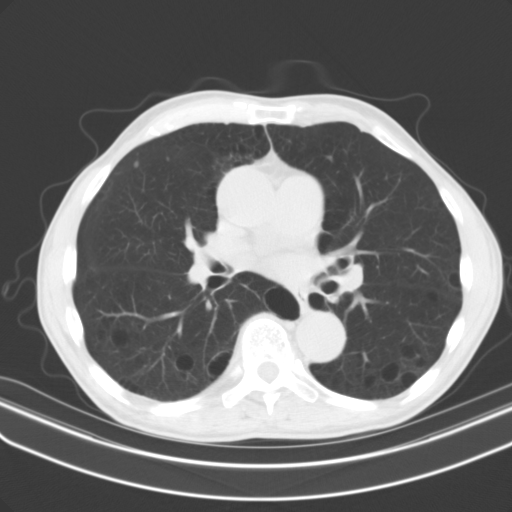
[im 45/76  mediastinal]
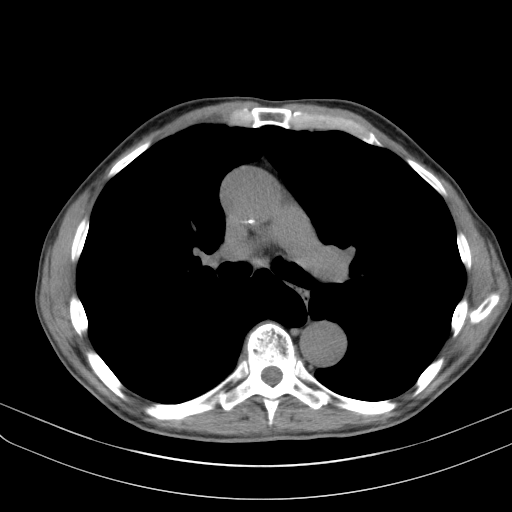
[im 45/76  lung]
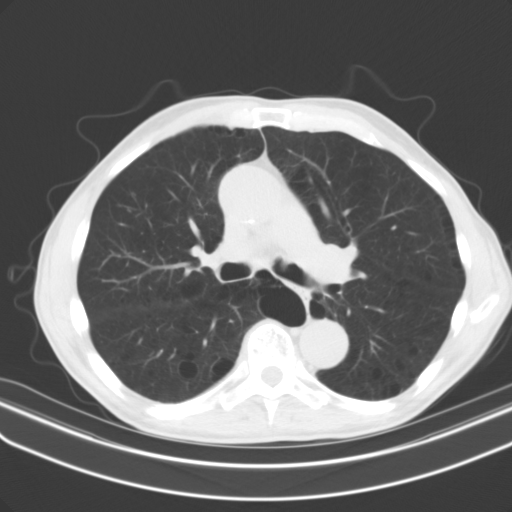
[im 48/76  lung]
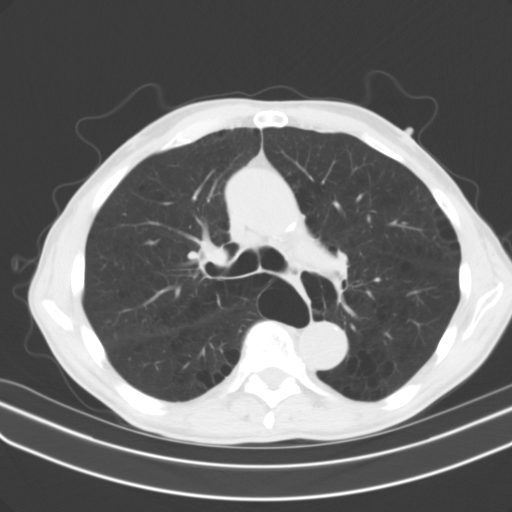
[im 53/76  lung]
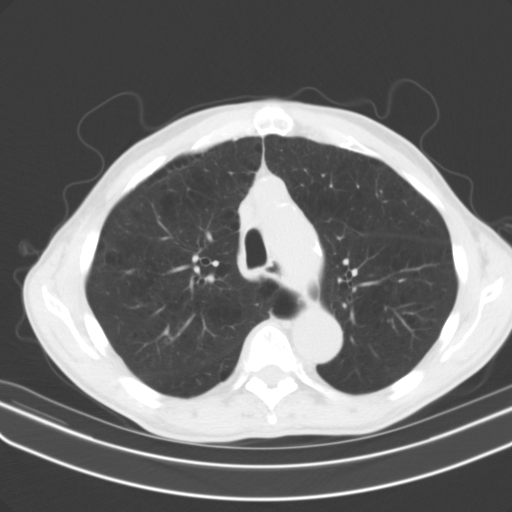
[im 59/76  lung]
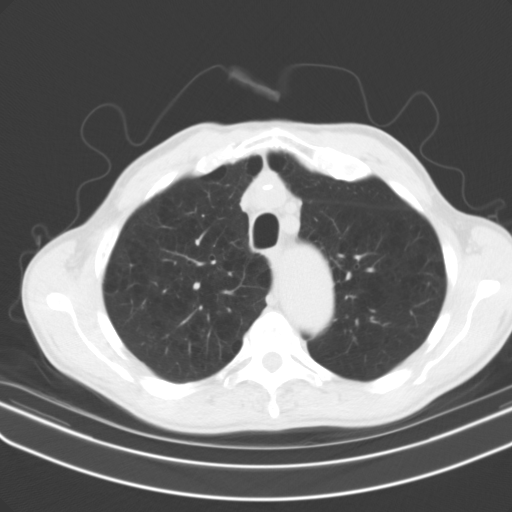
[im 62/76  mediastinal]
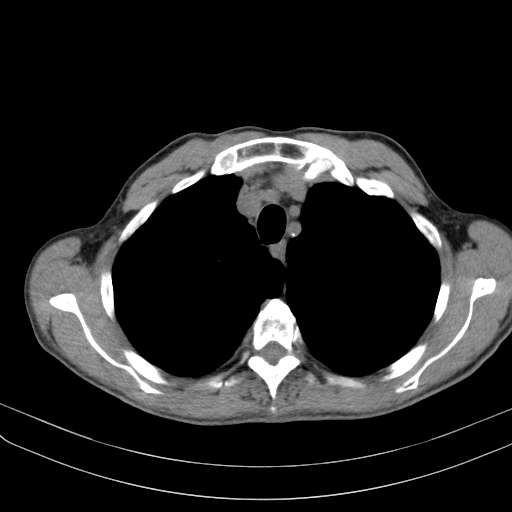
[im 62/76  lung]
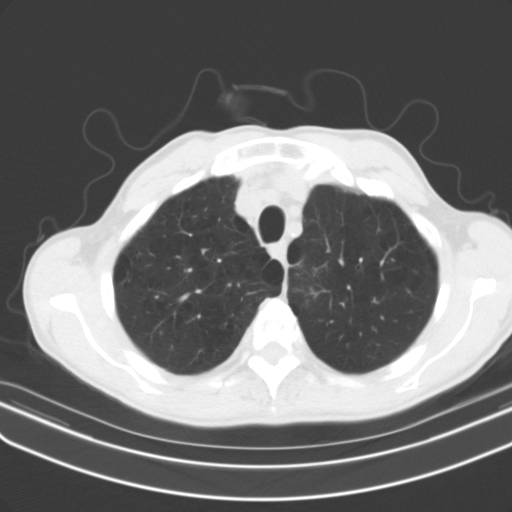
[im 67/76  lung]
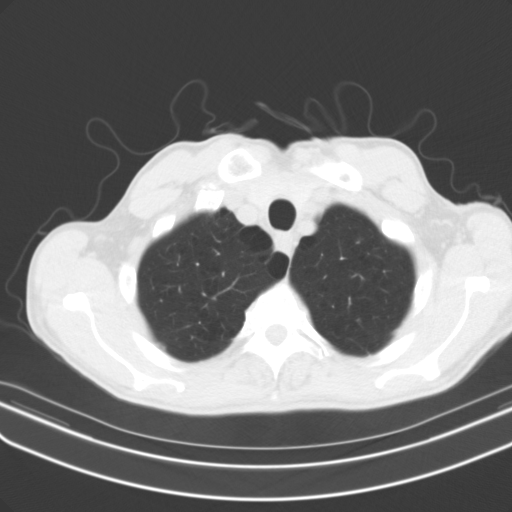
[im 73/76  lung]
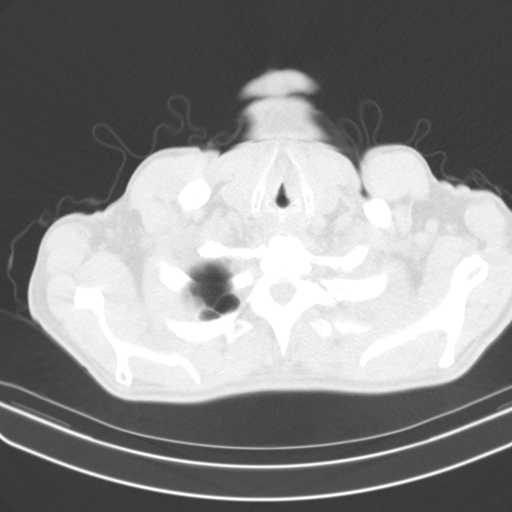

[15 of 32 positions shown; findings below may reference images not displayed]

FINDINGS: Cardiovascular: Heart is normal in size.  No pericardial effusion.

No evidence of thoracic aortic aneurysm. Atherosclerotic
calcifications of the aortic arch.

Mild three-vessel coronary atherosclerosis.

Mediastinum/Nodes: No suspicious mediastinal lymphadenopathy.

Visualized thyroid is unremarkable.

Lungs/Pleura: Moderate centrilobular and paraseptal emphysematous
changes, upper lung predominant.

No focal consolidation.

Two subpleural nodules in the right middle lobe along the minor
fissure, measuring up to 3.4 mm.

No pleural effusion or pneumothorax.

Upper Abdomen: Visualized upper abdomen is notable for scattered
hepatic cysts and vascular calcifications.

Musculoskeletal: Mild degenerative changes of the visualized
thoracolumbar spine.
IMPRESSION: Lung-RADS 2, benign appearance or behavior. Continue annual
screening with low-dose chest CT without contrast in 12 months.

Aortic Atherosclerosis (1KTNT-BN0.0) and Emphysema (1KTNT-G36.0).

## 2021-07-30 DIAGNOSIS — M6281 Muscle weakness (generalized): Secondary | ICD-10-CM | POA: Diagnosis not present

## 2021-07-30 DIAGNOSIS — I10 Essential (primary) hypertension: Secondary | ICD-10-CM | POA: Diagnosis not present

## 2021-09-17 ENCOUNTER — Telehealth: Payer: Self-pay

## 2021-09-17 NOTE — Chronic Care Management (AMB) (Signed)
    Chronic Care Management Pharmacy Assistant   Name: [name]  MRN: [id number] DOB: 1945/09/19   Reason for Encounter: Medication Review/ Medication Coordination    Medications: Outpatient Encounter Medications as of 09/17/2021  Medication Sig   amLODipine (NORVASC) 5 MG tablet TAKE ONE TABLET BY MOUTH ONCE DAILY   aspirin EC 81 MG tablet Take 81 mg by mouth daily.   atorvastatin (LIPITOR) 10 MG tablet Take one tablet by mouth M - F   escitalopram (LEXAPRO) 5 MG tablet TAKE ONE TABLET BY MOUTH EVERY EVENING   Memantine HCl-Donepezil HCl (NAMZARIC) 28-10 MG CP24 Take 1 capsule by mouth daily.   Vitamin D, Ergocalciferol, (DRISDOL) 1.25 MG (50000 UNIT) CAPS capsule Take 1 capsule (50,000 Units total) by mouth every 7 (seven) days.   No facility-administered encounter medications on file as of 09/17/2021.    BP Readings from Last 3 Encounters:  08/04/20 130/72  04/08/20 (!) 142/84  04/08/20 (!) 142/84    Lab Results  Component Value Date   HGBA1C 5.5 04/08/2020    Patient no longer with CCM.  [place] Pharmacist Assistant [phone]

## 2021-09-24 ENCOUNTER — Other Ambulatory Visit: Payer: Self-pay | Admitting: Nurse Practitioner

## 2021-09-24 DIAGNOSIS — I10 Essential (primary) hypertension: Secondary | ICD-10-CM

## 2021-09-24 DIAGNOSIS — F3289 Other specified depressive episodes: Secondary | ICD-10-CM

## 2022-03-14 ENCOUNTER — Other Ambulatory Visit: Payer: Self-pay | Admitting: Nurse Practitioner

## 2022-03-14 DIAGNOSIS — I10 Essential (primary) hypertension: Secondary | ICD-10-CM

## 2022-05-19 ENCOUNTER — Other Ambulatory Visit: Payer: Self-pay | Admitting: Nurse Practitioner

## 2022-05-19 DIAGNOSIS — I10 Essential (primary) hypertension: Secondary | ICD-10-CM

## 2022-05-19 DIAGNOSIS — F3289 Other specified depressive episodes: Secondary | ICD-10-CM
